# Patient Record
Sex: Female | Born: 1935 | Race: White | Hispanic: No | Smoking: Current every day smoker
Health system: Southern US, Community
[De-identification: ages and names within clinical notes are randomized; demographics above are authoritative.]

## PROBLEM LIST (undated history)

## (undated) DIAGNOSIS — J449 Chronic obstructive pulmonary disease, unspecified: Secondary | ICD-10-CM

## (undated) DIAGNOSIS — D649 Anemia, unspecified: Secondary | ICD-10-CM

## (undated) DIAGNOSIS — I82409 Acute embolism and thrombosis of unspecified deep veins of unspecified lower extremity: Secondary | ICD-10-CM

## (undated) DIAGNOSIS — E785 Hyperlipidemia, unspecified: Secondary | ICD-10-CM

## (undated) DIAGNOSIS — G610 Guillain-Barre syndrome: Secondary | ICD-10-CM

## (undated) DIAGNOSIS — I10 Essential (primary) hypertension: Secondary | ICD-10-CM

## (undated) DIAGNOSIS — I509 Heart failure, unspecified: Secondary | ICD-10-CM

## (undated) DIAGNOSIS — K5090 Crohn's disease, unspecified, without complications: Secondary | ICD-10-CM

## (undated) DIAGNOSIS — M87059 Idiopathic aseptic necrosis of unspecified femur: Secondary | ICD-10-CM

## (undated) HISTORY — PX: TOTAL HIP ARTHROPLASTY: SHX124

## (undated) HISTORY — PX: ABDOMINAL HYSTERECTOMY: SHX81

## (undated) HISTORY — PX: JOINT REPLACEMENT: SHX530

---

## 2004-12-10 ENCOUNTER — Inpatient Hospital Stay: Payer: Self-pay | Admitting: Internal Medicine

## 2005-05-19 ENCOUNTER — Other Ambulatory Visit: Payer: Self-pay

## 2005-05-19 ENCOUNTER — Inpatient Hospital Stay: Payer: Self-pay | Admitting: Internal Medicine

## 2006-01-16 ENCOUNTER — Ambulatory Visit: Payer: Self-pay | Admitting: Gastroenterology

## 2006-04-29 ENCOUNTER — Ambulatory Visit: Payer: Self-pay | Admitting: Family

## 2006-05-13 ENCOUNTER — Ambulatory Visit: Payer: Self-pay | Admitting: Family

## 2006-10-18 ENCOUNTER — Ambulatory Visit: Payer: Self-pay | Admitting: Family

## 2007-03-18 ENCOUNTER — Ambulatory Visit: Payer: Self-pay | Admitting: Ophthalmology

## 2007-10-21 ENCOUNTER — Ambulatory Visit: Payer: Self-pay | Admitting: Internal Medicine

## 2008-02-10 ENCOUNTER — Ambulatory Visit: Payer: Self-pay | Admitting: Ophthalmology

## 2008-02-10 ENCOUNTER — Other Ambulatory Visit: Payer: Self-pay

## 2008-02-24 ENCOUNTER — Ambulatory Visit: Payer: Self-pay | Admitting: Ophthalmology

## 2008-04-23 ENCOUNTER — Ambulatory Visit: Payer: Self-pay | Admitting: Internal Medicine

## 2008-10-21 ENCOUNTER — Ambulatory Visit: Payer: Self-pay | Admitting: Internal Medicine

## 2008-11-02 ENCOUNTER — Ambulatory Visit: Payer: Self-pay | Admitting: Internal Medicine

## 2009-03-16 ENCOUNTER — Ambulatory Visit: Payer: Self-pay | Admitting: Family

## 2009-05-03 ENCOUNTER — Ambulatory Visit: Payer: Self-pay | Admitting: Internal Medicine

## 2009-08-22 ENCOUNTER — Ambulatory Visit: Payer: Self-pay | Admitting: Gastroenterology

## 2009-08-30 ENCOUNTER — Ambulatory Visit: Payer: Self-pay | Admitting: Family

## 2009-11-24 ENCOUNTER — Ambulatory Visit: Payer: Self-pay | Admitting: Internal Medicine

## 2010-06-16 ENCOUNTER — Ambulatory Visit: Payer: Self-pay | Admitting: Internal Medicine

## 2010-12-20 ENCOUNTER — Ambulatory Visit: Payer: Self-pay | Admitting: Internal Medicine

## 2011-02-28 ENCOUNTER — Ambulatory Visit: Payer: Self-pay | Admitting: Internal Medicine

## 2011-03-01 ENCOUNTER — Ambulatory Visit: Payer: Self-pay | Admitting: Specialist

## 2011-03-07 ENCOUNTER — Inpatient Hospital Stay: Payer: Self-pay | Admitting: Specialist

## 2011-03-09 LAB — PATHOLOGY REPORT

## 2011-03-13 ENCOUNTER — Encounter: Payer: Self-pay | Admitting: Internal Medicine

## 2011-04-06 ENCOUNTER — Encounter: Payer: Self-pay | Admitting: Internal Medicine

## 2011-07-12 ENCOUNTER — Ambulatory Visit: Payer: Self-pay | Admitting: Internal Medicine

## 2012-02-07 DIAGNOSIS — J449 Chronic obstructive pulmonary disease, unspecified: Secondary | ICD-10-CM | POA: Diagnosis not present

## 2012-02-07 DIAGNOSIS — E559 Vitamin D deficiency, unspecified: Secondary | ICD-10-CM | POA: Diagnosis not present

## 2012-02-07 DIAGNOSIS — Z87891 Personal history of nicotine dependence: Secondary | ICD-10-CM | POA: Diagnosis not present

## 2012-02-07 DIAGNOSIS — C61 Malignant neoplasm of prostate: Secondary | ICD-10-CM | POA: Diagnosis not present

## 2012-02-07 DIAGNOSIS — K5190 Ulcerative colitis, unspecified, without complications: Secondary | ICD-10-CM | POA: Diagnosis not present

## 2012-02-07 DIAGNOSIS — M25569 Pain in unspecified knee: Secondary | ICD-10-CM | POA: Diagnosis not present

## 2012-02-07 DIAGNOSIS — I10 Essential (primary) hypertension: Secondary | ICD-10-CM | POA: Diagnosis not present

## 2012-02-07 DIAGNOSIS — K5180 Other ulcerative colitis without complications: Secondary | ICD-10-CM | POA: Diagnosis not present

## 2012-02-19 DIAGNOSIS — J398 Other specified diseases of upper respiratory tract: Secondary | ICD-10-CM | POA: Diagnosis not present

## 2012-05-20 DIAGNOSIS — J449 Chronic obstructive pulmonary disease, unspecified: Secondary | ICD-10-CM | POA: Diagnosis not present

## 2012-05-20 DIAGNOSIS — Z87891 Personal history of nicotine dependence: Secondary | ICD-10-CM | POA: Diagnosis not present

## 2012-05-20 DIAGNOSIS — M25569 Pain in unspecified knee: Secondary | ICD-10-CM | POA: Diagnosis not present

## 2012-05-20 DIAGNOSIS — I10 Essential (primary) hypertension: Secondary | ICD-10-CM | POA: Diagnosis not present

## 2012-05-26 DIAGNOSIS — R0602 Shortness of breath: Secondary | ICD-10-CM | POA: Diagnosis not present

## 2012-05-29 DIAGNOSIS — R0602 Shortness of breath: Secondary | ICD-10-CM | POA: Diagnosis not present

## 2012-05-29 DIAGNOSIS — D485 Neoplasm of uncertain behavior of skin: Secondary | ICD-10-CM | POA: Diagnosis not present

## 2012-05-29 DIAGNOSIS — L570 Actinic keratosis: Secondary | ICD-10-CM | POA: Diagnosis not present

## 2012-05-29 DIAGNOSIS — Z85828 Personal history of other malignant neoplasm of skin: Secondary | ICD-10-CM | POA: Diagnosis not present

## 2012-07-15 DIAGNOSIS — D0470 Carcinoma in situ of skin of unspecified lower limb, including hip: Secondary | ICD-10-CM | POA: Diagnosis not present

## 2012-08-19 DIAGNOSIS — J449 Chronic obstructive pulmonary disease, unspecified: Secondary | ICD-10-CM | POA: Diagnosis not present

## 2012-08-19 DIAGNOSIS — M159 Polyosteoarthritis, unspecified: Secondary | ICD-10-CM | POA: Diagnosis not present

## 2012-08-19 DIAGNOSIS — Z96649 Presence of unspecified artificial hip joint: Secondary | ICD-10-CM | POA: Diagnosis not present

## 2012-08-19 DIAGNOSIS — I10 Essential (primary) hypertension: Secondary | ICD-10-CM | POA: Diagnosis not present

## 2012-10-01 ENCOUNTER — Ambulatory Visit: Payer: Self-pay | Admitting: Internal Medicine

## 2012-10-01 DIAGNOSIS — R928 Other abnormal and inconclusive findings on diagnostic imaging of breast: Secondary | ICD-10-CM | POA: Diagnosis not present

## 2012-10-01 DIAGNOSIS — Z1231 Encounter for screening mammogram for malignant neoplasm of breast: Secondary | ICD-10-CM | POA: Diagnosis not present

## 2012-11-12 DIAGNOSIS — C44319 Basal cell carcinoma of skin of other parts of face: Secondary | ICD-10-CM | POA: Diagnosis not present

## 2012-11-12 DIAGNOSIS — D485 Neoplasm of uncertain behavior of skin: Secondary | ICD-10-CM | POA: Diagnosis not present

## 2012-11-12 DIAGNOSIS — L570 Actinic keratosis: Secondary | ICD-10-CM | POA: Diagnosis not present

## 2012-11-12 DIAGNOSIS — Z85828 Personal history of other malignant neoplasm of skin: Secondary | ICD-10-CM | POA: Diagnosis not present

## 2012-12-15 DIAGNOSIS — M549 Dorsalgia, unspecified: Secondary | ICD-10-CM | POA: Diagnosis not present

## 2012-12-15 DIAGNOSIS — I10 Essential (primary) hypertension: Secondary | ICD-10-CM | POA: Diagnosis not present

## 2012-12-15 DIAGNOSIS — Z87891 Personal history of nicotine dependence: Secondary | ICD-10-CM | POA: Diagnosis not present

## 2012-12-15 DIAGNOSIS — K5180 Other ulcerative colitis without complications: Secondary | ICD-10-CM | POA: Diagnosis not present

## 2013-01-21 DIAGNOSIS — C44319 Basal cell carcinoma of skin of other parts of face: Secondary | ICD-10-CM | POA: Diagnosis not present

## 2013-02-26 DIAGNOSIS — K921 Melena: Secondary | ICD-10-CM | POA: Diagnosis not present

## 2013-02-26 DIAGNOSIS — K5190 Ulcerative colitis, unspecified, without complications: Secondary | ICD-10-CM | POA: Diagnosis not present

## 2013-02-26 DIAGNOSIS — D649 Anemia, unspecified: Secondary | ICD-10-CM | POA: Diagnosis not present

## 2013-02-26 DIAGNOSIS — Z79899 Other long term (current) drug therapy: Secondary | ICD-10-CM | POA: Diagnosis not present

## 2013-02-27 ENCOUNTER — Inpatient Hospital Stay: Payer: Self-pay | Admitting: Internal Medicine

## 2013-02-27 DIAGNOSIS — K625 Hemorrhage of anus and rectum: Secondary | ICD-10-CM | POA: Diagnosis not present

## 2013-02-27 DIAGNOSIS — J449 Chronic obstructive pulmonary disease, unspecified: Secondary | ICD-10-CM | POA: Diagnosis present

## 2013-02-27 DIAGNOSIS — K5730 Diverticulosis of large intestine without perforation or abscess without bleeding: Secondary | ICD-10-CM | POA: Diagnosis not present

## 2013-02-27 DIAGNOSIS — M899 Disorder of bone, unspecified: Secondary | ICD-10-CM | POA: Diagnosis present

## 2013-02-27 DIAGNOSIS — K5289 Other specified noninfective gastroenteritis and colitis: Secondary | ICD-10-CM | POA: Diagnosis not present

## 2013-02-27 DIAGNOSIS — I10 Essential (primary) hypertension: Secondary | ICD-10-CM | POA: Diagnosis not present

## 2013-02-27 DIAGNOSIS — Z96649 Presence of unspecified artificial hip joint: Secondary | ICD-10-CM | POA: Diagnosis not present

## 2013-02-27 DIAGNOSIS — K5010 Crohn's disease of large intestine without complications: Secondary | ICD-10-CM | POA: Diagnosis present

## 2013-02-27 DIAGNOSIS — K624 Stenosis of anus and rectum: Secondary | ICD-10-CM | POA: Diagnosis not present

## 2013-02-27 DIAGNOSIS — K921 Melena: Secondary | ICD-10-CM | POA: Diagnosis not present

## 2013-02-27 DIAGNOSIS — K551 Chronic vascular disorders of intestine: Secondary | ICD-10-CM | POA: Diagnosis present

## 2013-02-27 DIAGNOSIS — F17200 Nicotine dependence, unspecified, uncomplicated: Secondary | ICD-10-CM | POA: Diagnosis present

## 2013-02-27 DIAGNOSIS — D638 Anemia in other chronic diseases classified elsewhere: Secondary | ICD-10-CM | POA: Diagnosis not present

## 2013-02-27 DIAGNOSIS — D649 Anemia, unspecified: Secondary | ICD-10-CM | POA: Diagnosis not present

## 2013-02-27 DIAGNOSIS — E870 Hyperosmolality and hypernatremia: Secondary | ICD-10-CM | POA: Diagnosis present

## 2013-02-27 DIAGNOSIS — R6889 Other general symptoms and signs: Secondary | ICD-10-CM | POA: Diagnosis not present

## 2013-02-27 DIAGNOSIS — D500 Iron deficiency anemia secondary to blood loss (chronic): Secondary | ICD-10-CM | POA: Diagnosis present

## 2013-02-27 DIAGNOSIS — E785 Hyperlipidemia, unspecified: Secondary | ICD-10-CM | POA: Diagnosis present

## 2013-02-27 DIAGNOSIS — K5100 Ulcerative (chronic) pancolitis without complications: Secondary | ICD-10-CM | POA: Diagnosis not present

## 2013-02-27 DIAGNOSIS — K922 Gastrointestinal hemorrhage, unspecified: Secondary | ICD-10-CM | POA: Diagnosis not present

## 2013-02-27 DIAGNOSIS — E860 Dehydration: Secondary | ICD-10-CM | POA: Diagnosis not present

## 2013-02-27 LAB — CBC WITH DIFFERENTIAL/PLATELET
Basophil #: 0.1 10*3/uL (ref 0.0–0.1)
Basophil %: 0.7 %
Eosinophil #: 0.2 10*3/uL (ref 0.0–0.7)
Eosinophil %: 2.8 %
HGB: 10.6 g/dL — ABNORMAL LOW (ref 12.0–16.0)
Lymphocyte #: 1.4 10*3/uL (ref 1.0–3.6)
Lymphocyte %: 16.7 %
MCHC: 32.5 g/dL (ref 32.0–36.0)
MCV: 94 fL (ref 80–100)
Monocyte %: 10.2 %
Neutrophil #: 5.8 10*3/uL (ref 1.4–6.5)
Platelet: 388 10*3/uL (ref 150–440)
RBC: 3.47 10*6/uL — ABNORMAL LOW (ref 3.80–5.20)
RDW: 16.4 % — ABNORMAL HIGH (ref 11.5–14.5)
WBC: 8.3 10*3/uL (ref 3.6–11.0)

## 2013-02-27 LAB — COMPREHENSIVE METABOLIC PANEL
Anion Gap: 3 — ABNORMAL LOW (ref 7–16)
BUN: 37 mg/dL — ABNORMAL HIGH (ref 7–18)
Bilirubin,Total: 0.1 mg/dL — ABNORMAL LOW (ref 0.2–1.0)
Calcium, Total: 8.9 mg/dL (ref 8.5–10.1)
Chloride: 104 mmol/L (ref 98–107)
Co2: 35 mmol/L — ABNORMAL HIGH (ref 21–32)
EGFR (African American): 46 — ABNORMAL LOW
EGFR (Non-African Amer.): 40 — ABNORMAL LOW
Potassium: 3.7 mmol/L (ref 3.5–5.1)
SGPT (ALT): 8 U/L — ABNORMAL LOW (ref 12–78)
Sodium: 142 mmol/L (ref 136–145)
Total Protein: 6.3 g/dL — ABNORMAL LOW (ref 6.4–8.2)

## 2013-02-27 LAB — PROTIME-INR
INR: 1
Prothrombin Time: 13.1 secs (ref 11.5–14.7)

## 2013-02-27 LAB — URINALYSIS, COMPLETE
Blood: NEGATIVE
Leukocyte Esterase: NEGATIVE
Nitrite: NEGATIVE
RBC,UR: 1 /HPF (ref 0–5)
Specific Gravity: 1.012 (ref 1.003–1.030)
Squamous Epithelial: NONE SEEN
WBC UR: <1 /HPF (ref 0–5)

## 2013-02-28 LAB — HEMOGLOBIN
HGB: 9.7 g/dL — ABNORMAL LOW (ref 12.0–16.0)
HGB: 10.2 g/dL — ABNORMAL LOW (ref 12.0–16.0)

## 2013-03-01 LAB — BASIC METABOLIC PANEL
Anion Gap: 4 — ABNORMAL LOW (ref 7–16)
BUN: 21 mg/dL — ABNORMAL HIGH (ref 7–18)
Co2: 30 mmol/L (ref 21–32)
Creatinine: 1.02 mg/dL (ref 0.60–1.30)
EGFR (African American): 59 — ABNORMAL LOW
EGFR (Non-African Amer.): 51 — ABNORMAL LOW
Glucose: 148 mg/dL — ABNORMAL HIGH (ref 65–99)
Osmolality: 296 (ref 275–301)
Potassium: 4.4 mmol/L (ref 3.5–5.1)
Sodium: 146 mmol/L — ABNORMAL HIGH (ref 136–145)

## 2013-03-02 LAB — BASIC METABOLIC PANEL
Anion Gap: 9 (ref 7–16)
BUN: 22 mg/dL — ABNORMAL HIGH (ref 7–18)
Calcium, Total: 8.6 mg/dL (ref 8.5–10.1)
Co2: 25 mmol/L (ref 21–32)
Creatinine: 1.05 mg/dL (ref 0.60–1.30)
EGFR (Non-African Amer.): 49 — ABNORMAL LOW
Osmolality: 290 (ref 275–301)
Potassium: 3.7 mmol/L (ref 3.5–5.1)
Sodium: 143 mmol/L (ref 136–145)

## 2013-03-02 LAB — MAGNESIUM: Magnesium: 1.8 mg/dL

## 2013-03-10 DIAGNOSIS — J983 Compensatory emphysema: Secondary | ICD-10-CM | POA: Diagnosis not present

## 2013-03-10 DIAGNOSIS — G609 Hereditary and idiopathic neuropathy, unspecified: Secondary | ICD-10-CM | POA: Diagnosis not present

## 2013-03-20 DIAGNOSIS — J983 Compensatory emphysema: Secondary | ICD-10-CM | POA: Diagnosis not present

## 2013-03-20 DIAGNOSIS — K5180 Other ulcerative colitis without complications: Secondary | ICD-10-CM | POA: Diagnosis not present

## 2013-03-20 DIAGNOSIS — M159 Polyosteoarthritis, unspecified: Secondary | ICD-10-CM | POA: Diagnosis not present

## 2013-03-20 DIAGNOSIS — I10 Essential (primary) hypertension: Secondary | ICD-10-CM | POA: Diagnosis not present

## 2013-03-23 DIAGNOSIS — K5150 Left sided colitis without complications: Secondary | ICD-10-CM | POA: Diagnosis not present

## 2013-04-14 DIAGNOSIS — Z85828 Personal history of other malignant neoplasm of skin: Secondary | ICD-10-CM | POA: Diagnosis not present

## 2013-04-14 DIAGNOSIS — L570 Actinic keratosis: Secondary | ICD-10-CM | POA: Diagnosis not present

## 2013-04-20 DIAGNOSIS — M159 Polyosteoarthritis, unspecified: Secondary | ICD-10-CM | POA: Diagnosis not present

## 2013-04-20 DIAGNOSIS — J449 Chronic obstructive pulmonary disease, unspecified: Secondary | ICD-10-CM | POA: Diagnosis not present

## 2013-04-20 DIAGNOSIS — J983 Compensatory emphysema: Secondary | ICD-10-CM | POA: Diagnosis not present

## 2013-04-20 DIAGNOSIS — E785 Hyperlipidemia, unspecified: Secondary | ICD-10-CM | POA: Diagnosis not present

## 2013-05-21 DIAGNOSIS — J983 Compensatory emphysema: Secondary | ICD-10-CM | POA: Diagnosis not present

## 2013-05-21 DIAGNOSIS — J449 Chronic obstructive pulmonary disease, unspecified: Secondary | ICD-10-CM | POA: Diagnosis not present

## 2013-06-16 DIAGNOSIS — K5180 Other ulcerative colitis without complications: Secondary | ICD-10-CM | POA: Diagnosis not present

## 2013-06-16 DIAGNOSIS — M159 Polyosteoarthritis, unspecified: Secondary | ICD-10-CM | POA: Diagnosis not present

## 2013-06-16 DIAGNOSIS — Z87891 Personal history of nicotine dependence: Secondary | ICD-10-CM | POA: Diagnosis not present

## 2013-06-16 DIAGNOSIS — I10 Essential (primary) hypertension: Secondary | ICD-10-CM | POA: Diagnosis not present

## 2013-09-15 DIAGNOSIS — J449 Chronic obstructive pulmonary disease, unspecified: Secondary | ICD-10-CM | POA: Diagnosis not present

## 2013-09-15 DIAGNOSIS — Z87891 Personal history of nicotine dependence: Secondary | ICD-10-CM | POA: Diagnosis not present

## 2013-09-15 DIAGNOSIS — J983 Compensatory emphysema: Secondary | ICD-10-CM | POA: Diagnosis not present

## 2013-10-05 ENCOUNTER — Ambulatory Visit: Payer: Self-pay | Admitting: Internal Medicine

## 2013-10-05 DIAGNOSIS — Z1231 Encounter for screening mammogram for malignant neoplasm of breast: Secondary | ICD-10-CM | POA: Diagnosis not present

## 2013-10-05 DIAGNOSIS — R922 Inconclusive mammogram: Secondary | ICD-10-CM | POA: Diagnosis not present

## 2013-11-26 DIAGNOSIS — K5190 Ulcerative colitis, unspecified, without complications: Secondary | ICD-10-CM | POA: Diagnosis not present

## 2014-01-19 DIAGNOSIS — Z87891 Personal history of nicotine dependence: Secondary | ICD-10-CM | POA: Diagnosis not present

## 2014-01-19 DIAGNOSIS — M25569 Pain in unspecified knee: Secondary | ICD-10-CM | POA: Diagnosis not present

## 2014-01-19 DIAGNOSIS — M549 Dorsalgia, unspecified: Secondary | ICD-10-CM | POA: Diagnosis not present

## 2014-01-19 DIAGNOSIS — J983 Compensatory emphysema: Secondary | ICD-10-CM | POA: Diagnosis not present

## 2014-03-08 DIAGNOSIS — L570 Actinic keratosis: Secondary | ICD-10-CM | POA: Diagnosis not present

## 2014-03-08 DIAGNOSIS — I10 Essential (primary) hypertension: Secondary | ICD-10-CM | POA: Diagnosis not present

## 2014-03-08 DIAGNOSIS — C44319 Basal cell carcinoma of skin of other parts of face: Secondary | ICD-10-CM | POA: Diagnosis not present

## 2014-03-08 DIAGNOSIS — J983 Compensatory emphysema: Secondary | ICD-10-CM | POA: Diagnosis not present

## 2014-03-08 DIAGNOSIS — L821 Other seborrheic keratosis: Secondary | ICD-10-CM | POA: Diagnosis not present

## 2014-03-08 DIAGNOSIS — M159 Polyosteoarthritis, unspecified: Secondary | ICD-10-CM | POA: Diagnosis not present

## 2014-03-08 DIAGNOSIS — Z85828 Personal history of other malignant neoplasm of skin: Secondary | ICD-10-CM | POA: Diagnosis not present

## 2014-03-08 DIAGNOSIS — D649 Anemia, unspecified: Secondary | ICD-10-CM | POA: Diagnosis not present

## 2014-03-08 DIAGNOSIS — J449 Chronic obstructive pulmonary disease, unspecified: Secondary | ICD-10-CM | POA: Diagnosis not present

## 2014-03-08 DIAGNOSIS — D485 Neoplasm of uncertain behavior of skin: Secondary | ICD-10-CM | POA: Diagnosis not present

## 2014-03-08 DIAGNOSIS — E785 Hyperlipidemia, unspecified: Secondary | ICD-10-CM | POA: Diagnosis not present

## 2014-04-08 DIAGNOSIS — M159 Polyosteoarthritis, unspecified: Secondary | ICD-10-CM | POA: Diagnosis not present

## 2014-04-08 DIAGNOSIS — M549 Dorsalgia, unspecified: Secondary | ICD-10-CM | POA: Diagnosis not present

## 2014-04-08 DIAGNOSIS — Z96649 Presence of unspecified artificial hip joint: Secondary | ICD-10-CM | POA: Diagnosis not present

## 2014-04-08 DIAGNOSIS — I119 Hypertensive heart disease without heart failure: Secondary | ICD-10-CM | POA: Diagnosis not present

## 2014-09-13 DIAGNOSIS — N182 Chronic kidney disease, stage 2 (mild): Secondary | ICD-10-CM | POA: Diagnosis not present

## 2014-09-13 DIAGNOSIS — E784 Other hyperlipidemia: Secondary | ICD-10-CM | POA: Diagnosis not present

## 2014-09-13 DIAGNOSIS — J983 Compensatory emphysema: Secondary | ICD-10-CM | POA: Diagnosis not present

## 2014-09-13 DIAGNOSIS — M159 Polyosteoarthritis, unspecified: Secondary | ICD-10-CM | POA: Diagnosis not present

## 2014-12-06 ENCOUNTER — Ambulatory Visit: Payer: Self-pay | Admitting: Internal Medicine

## 2014-12-06 DIAGNOSIS — Z1231 Encounter for screening mammogram for malignant neoplasm of breast: Secondary | ICD-10-CM | POA: Diagnosis not present

## 2015-01-10 DIAGNOSIS — I10 Essential (primary) hypertension: Secondary | ICD-10-CM | POA: Diagnosis not present

## 2015-01-10 DIAGNOSIS — J983 Compensatory emphysema: Secondary | ICD-10-CM | POA: Diagnosis not present

## 2015-01-10 DIAGNOSIS — G4700 Insomnia, unspecified: Secondary | ICD-10-CM | POA: Diagnosis not present

## 2015-01-10 DIAGNOSIS — J449 Chronic obstructive pulmonary disease, unspecified: Secondary | ICD-10-CM | POA: Diagnosis not present

## 2015-02-24 DIAGNOSIS — G450 Vertebro-basilar artery syndrome: Secondary | ICD-10-CM | POA: Diagnosis not present

## 2015-02-24 DIAGNOSIS — R609 Edema, unspecified: Secondary | ICD-10-CM | POA: Diagnosis not present

## 2015-02-24 DIAGNOSIS — G459 Transient cerebral ischemic attack, unspecified: Secondary | ICD-10-CM | POA: Diagnosis not present

## 2015-02-25 NOTE — Consult Note (Signed)
PATIENT NAME:  Lindsey Hobbs, Lindsey Hobbs MR#:  [id number] DATE OF BIRTH:  1927-01-08  DATE OF CONSULTATION:  02/28/2013  CONSULTING PHYSICIAN:  Lollie Sails, MD  REASON FOR CONSULTATION: Rectal bleeding.   HISTORY OF PRESENT ILLNESS: This is a patient of Dr. Lenore Manner. The patient is an 81 year old Caucasian female who presented to the Emergency Room yesterday with rectal bleeding. She states that she went to the bathroom earlier in the afternoon and noted a bloody bowel movement. This was repeated 3 times over the course of the evening and she came to the Emergency Room. The last time she saw any blood was indeed yesterday afternoon. She has a history of ulcerative colitis, her last colonoscopy being 08/22/2009 with finding of a pancolitis. She has been taking mesalamine 0.375 gram tablets, two 3 times a day. However, she has also been taking some meloxicam. She stated that this occurred without any antecedent problems with abdominal pain or nausea. She states that she felt her ulcerative colitis was doing quite well. There is no nausea, vomiting or abdominal pain. There is no heartburn. There is some occasional dysphagia to large pills. She has a bowel movement usually 3 to 4 times a day that she describes as mushy, however, denies any blood in the stools until yesterday afternoon. When she was diagnosed a good 12 years ago or so, she states that she presented at that time with abdominal pain as well as bloody stool.   GASTROINTESTINAL FAMILY HISTORY: Negative for colorectal cancer, liver disease or ulcers.   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 She has had a hysterectomy.   SOCIAL HISTORY: She is a smoker, 1/2 pack of cigarettes daily long term. She does not drink alcohol. She lives alone.   OUTPATIENT MEDICATIONS: Include  Actonel 35 mg once a week, Advil 200 mg 2 tablets every 6 hours p.r.n., Altace 10 mg once a day, aspirin 81 mg, Caltrate 600 plus D, meloxicam 1 twice a day, mesalamine as noted, Systane ophthalmic drops.   ALLERGIES: She has no known drug allergies.   REVIEW OF SYSTEMS: Ten systems reviewed per admission history and physical negative.   PHYSICAL EXAMINATION:  VITAL SIGNS: Temperature is 97.8, pulse 72, respirations 18, blood pressure 136/81, pulse oximetry 91.  GENERAL: She is an 81 year old Caucasian female in no acute distress. She denies any nausea or abdominal pain.  HEENT: Normocephalic, atraumatic. Eyes: Anicteric. Nose: Septum midline. No lesions. Oropharynx: Poor to fair dentition.  NECK: No JVD.  HEART: Regular rate and rhythm.  LUNGS: Clear.  ABDOMEN: Soft, nontender, nondistended. Bowel sounds positive, normoactive.  RECTAL: Anorectal examination shows some old, dryish maroon type of effluent. There is no fresh active bleeding and no mucoid type stool. There are multiple external skin tags.  EXTREMITIES: No clubbing, cyanosis or edema.  NEUROLOGICAL: Cranial nerves II through XII grossly intact. Muscle strength bilaterally equal and symmetric. DTRs bilaterally equal and symmetric.   LABORATORY AND RADIOLOGICAL DATA: On admission to the hospital, she had a glucose of 125, BUN 37, creatinine 1.25, sodium 142, potassium 3.7, chloride 104, bicarbonate 35, calcium 8.9. Hepatic profile showing a total protein of 6.3, albumin 2.6, total bilirubin 0.1, alkaline phosphatase 40, AST 19, ALT 8. Hemogram showed a white count of 8.3, hemoglobin and hematocrit 10.6 and 32.7, platelet count 388, MCV 94. Repeat hemoglobin this morning at 9.7. Her INR is  1.0 with a pro time of 13.1. Urinalysis was negative. She had a GI bleeding scan which was negative for lower GI bleed.   ASSESSMENT: Rectal bleeding in the setting of a history of inflammatory bowel disease. It is of note that she was initially  diagnosed with Crohn's disease, this later being changed to ulcerative colitis. In review of her past several colonoscopies, these are consistent more so with ulcerative colitis in a pancolitis type fashion. She has been stable apparently for quite some time. The differential diagnoses on her rectal bleeding would include some exacerbation of her colitis as well as anal outlet bleeding from internal hemorrhoids, other bleeding sources such as stool for Dieulafoy lesions/vascular anomalies, et Ronney Asters. Doubt this is diverticular in nature, although also a possibility. Of note, negative GI bleeding scan. I am more concerned that this is likely an anal outlet type bleed.   RECOMMENDATION: Will see the patient again tomorrow and consideration for luminal evaluation. Quite likely a flexible sigmoidoscopy will be enough to determine whether she is having exacerbation of colitis versus anal outlet bleeding.   ____________________________ Lollie Sails, MD mus:jm D: 02/28/2013 14:12:28 ET T: 02/28/2013 14:44:06 ET JOB#: [id number]  cc: Lollie Sails, MD, <Dictator> Lollie Sails MD ELECTRONICALLY SIGNED 03/17/2013 13:02

## 2015-02-25 NOTE — Discharge Summary (Signed)
PATIENT NAME:  Lindsey Hobbs, Lindsey Hobbs MR#:  [id number] DATE OF BIRTH:  1927-05-07  DATE OF ADMISSION:  02/27/2013 DATE OF DISCHARGE:  03/03/2013  PRIMARY CARE PHYSICIAN: Dr. Lavera Guise.    CONSULTATIONS: Dr. Gustavo Lah.   PROCEDURE:  Sigmoidoscopy.   DISCHARGE DIAGNOSES: 1.  Gastrointestinal bleeding, possibly due to Crohn's disease versus chronic ischemic colitis.  2.  Anemia.  3.  Dehydration.  4.  Hypernatremia.   CONDITION: Stable.   CODE STATUS: Full code   HOME MEDICATIONS: Please refer to the [facility] discharge instruction medication reconciliation list.  The patient needs to continue mesalamine and also prednisone.  Can follow up with GI as an outpatient.    DIET:  Low sodium diet.  ACTIVITY: As tolerated.   FOLLOWUP CARE: Follow up with PCP within 1 to 2 weeks. Follow up with Dr. Gustavo Lah within 1 week.   REASON FOR ADMISSION: Bright red blood per rectum.    HOSPITAL COURSE:  1.  The patient is an 81 year old Caucasian female with a history of ulcerative colitis, hypertension, COPD, presented to the ED with bright red blood per rectum.  The patient was admitted for GI bleeding and for further workup.  For detailed history and physical examination, please refer to the admission note dictated by Dr. Lenore Manner. On admission date, the patient's BUN 37, creatinine 1.25, sodium 142, potassium 3.7. CBC showed WBC 8000, hemoglobin 10, platelets 388. INR 1. After admission, the patient was kept n.p.o. and hemoglobin was checked at q.8 hours.  The patient was also treated with IV fluid support, Solu-Medrol. In addition, mesalamine was continued. The patient's aspirin, meloxicam and Advil was discontinued due to GI bleeding. Dr. Gustavo Lah suggested sigmoidoscopy which showed rectal mucosa friable but otherwise normal in appearance. In addition, the patient has diverticulosis, healing ulcerated area noted in area of stenosis. Dr. Gustavo Lah did biopsy.  He suggested the patient may have Crohn's disease versus  chronic ischemic colitis. In addition, he ordered IBD panel. He suggested to follow up him as an outpatient.  2.  Dehydration after IV fluid support has improved.  3.  Hypernatremia has improved after IV fluid support.  4.  Hypertension, COPD has been stable.   The patient has no active bleeding. She only had 1 episode of bloody stool yesterday, but hemoglobin has been stable, hemoglobin today is 9.9. The patient will be discharged to home today and follow up with Dr. Gustavo Lah as outpatient. I discussed the patient's discharge plan with patient, Dr. Gustavo Lah and case manager.   TIME SPENT: About 38 minutes.   ____________________________ Demetrios Loll, MD qc:cs D: 03/03/2013 16:17:00 ET T: 03/03/2013 20:08:52 ET JOB#: [id number]  cc: Demetrios Loll, MD, <Dictator> Demetrios Loll MD ELECTRONICALLY SIGNED 03/04/2013 14:35

## 2015-02-25 NOTE — H&P (Signed)
PATIENT NAME:  Lindsey Hobbs, Lindsey Hobbs MR#:  [id number] DATE OF BIRTH:  September 14, 1927  DATE OF ADMISSION:  02/27/2013  PRIMARY CARE PHYSICIAN: Rinaldo Cloud, MD   REFERRING PHYSICIAN: Marta Antu, MD  CHIEF COMPLAINT: Bright red blood per rectum.   HISTORY OF PRESENT ILLNESS: The patient is an 81 year old Caucasian female with a history of ulcerative colitis, systemic hypertension, chronic obstructive pulmonary disease. The patient was in her usual state of health until yesterday when she started to episodes of bright red blood per rectum and then another three episodes today. Finally, she decided to come to the hospital for further evaluation. Work-up here at the Emergency Department reveals stable vital signs and her hemoglobin was 10.6. The patient is scheduled to have a bleeding scan and now she is in the process to be admitted to the hospital for further evaluation and treatment.   REVIEW OF SYSTEMS.  CONSTITUTIONAL: Denies any fever. No chills. No fatigue.  EYES: No blurring of vision. No double vision.  ENT: No hearing impairment. No sore throat. No dysphagia.  CARDIOVASCULAR: No chest pain. No shortness of breath. No syncope.  RESPIRATORY: No cough. No shortness of breath. No chest pain.  GASTROINTESTINAL: No abdominal pain. No vomiting, but no diarrhea, but she has bright red blood per rectum x 4 episodes over the last 48 hours.  GENITOURINARY: No dysuria. No frequency of urination.  MUSCULOSKELETAL: No joint pain or swelling. No muscular pain or swelling today.  INTEGUMENTARY: No skin rash or ulcers.  NEUROLOGY: No focal weakness. No seizure activity. No headache.  PSYCHIATRY: No anxiety. No depression.  ENDOCRINE: No polyuria or polydipsia. No heat or cold intolerance.   PAST MEDICAL HISTORY: Ulcerative colitis. The last colonoscopy in the records is October 2010 showing internal hemorrhoids congested, erythematous and friable with spontaneous  bleeding, hemorrhagic and ulcerated mucosa over  the entire examined colon. Of note in her old records that she had Crohn's disease, but the patient tells me that this diagnosis was changed to ulcerative colitis. Her history also includes hypertension, chronic obstructive pulmonary disease, ongoing tobacco abuse and history of left hip avascular necrosis; underwent total hip replacement, hyperlipidemia, osteopenia, and also history of Guillain-Barre syndrome in 1993.   PAST SURGICAL HISTORY: Hysterectomy and left total hip replacement after developing severe avascular necrosis.   FAMILY HISTORY: Her mother suffered from breast cancer and congestive heart failure and she is deceased now. She does not have much of information about her father. She is just guessing that he had a heart attack.   SOCIAL HABITS: Chronic smoker of 1/2 pack a day since age of 48. No history of alcohol abuse.   SOCIAL HISTORY: She is divorced, lives at home alone and her son checks on her every now and then.   ADMISSION MEDICATIONS: Mesalamine 0.0375 grams 2 capsules 3 x a day, Altace 10 mg once a day, aspirin 81 mg a day, Advil 200 mg 2 tablets q.6 hours p.r.n. and meloxicam, dose was not specified but she takes it twice a day, Actonel 35 mg once a week on Wednesdays, Caltrate 600 with vitamin D twice a day and Systane ophthalmic solution.   ALLERGIES: No known drug allergies.   PHYSICAL EXAMINATION:  VITAL SIGNS: Blood pressure 145/66, pulse 84, respiratory rate 18, temperature 98.6. Oxygen saturation 99%.  GENERAL APPEARANCE: Elderly female lying in bed in no acute distress.  HEAD AND NECK: No pallor. No icterus. No cyanosis.  EARS, NOSE, THROAT: Ear examination revealed normal hearing, no discharge, no  lesions. Examination of the nose showed no bleeding, no ulcers, no discharge. Examination of the mouth showed normal lips and tongue, no oral thrush, no exudates. Eye examination revealed normal eyelids and conjunctivae. Pupils about 4 to 5 mm, round, equal, sluggishly  reactive to light.  NECK: Supple. Trachea at midline. No thyromegaly. No cervical lymphadenopathy. No masses.  HEART: Normal S1, S2. No S3, S4. No murmur. No gallop. No carotid bruits.  RESPIRATORY: Normal breathing pattern without use of accessory muscles. No rales. No wheezing.  ABDOMEN: Soft without tenderness. No hepatosplenomegaly. No masses. No hernias.  SKIN: No ulcers. No subcutaneous nodules. There are pink to slightly hyperpigmented macules over the entire body and appears to be benign.  MUSCULOSKELETAL: No joint swelling. No clubbing.  NEUROLOGIC: Cranial nerves II through XII are intact. No focal motor deficit.  PSYCHIATRIC: The patient is alert and oriented x 3. Mood and affect were normal.   LABORATORY FINDINGS: Her EKG showed normal sinus rhythm at rate of 87 per minute. Unremarkable EKG. Serum glucose 125, BUN 37, creatinine 1.25, sodium 142, potassium 3.7, calcium 8.9. Total protein 6.3, albumin 2.6. Total bilirubin 0.1, AST 19, ALT 8. CBC showed white count of 8000, hemoglobin 10, hematocrit 32, platelet count 388. Prothrombin time 13. INR 1, APTT 34.   ASSESSMENT:  1.  Bright red blood per rectum. 2.  Ulcerative colitis. 3.  Systemic hypertension.  4.  Anemia, which appears to be anemia of chronic disease combined with anemia due to blood loss.   Her other medical problems include chronic obstructive pulmonary disease, osteopenia, history of left hip avascular necrosis, underwent left hip replacement. History of systemic hypertension. The patient has hypertension, hyperlipidemia and past history of Guillain-Barre syndrome.   PLAN: We will admit the patient to telemetry monitoring. Follow up on her hemoglobin; bleeding scan was ordered. Check hemoglobin q.8 hours. IV fluids.  IV Solu-Medrol. Continue mesalamine. We will stop aspirin, meloxicam and Advil. I will also hold Altace in order not to compromise blood pressure in the face of continuous GI bleed until stabilization. GI  consultation. TED stockings for deep vein thrombosis prophylaxis. I advised the patient to stop smoking and I offered the nicotine patch but she declined.   Time spent in evaluating this patient took more than 55 minutes.   ____________________________ Clovis Pu. Lenore Manner, MD amd:am D: 02/27/2013 23:37:17 ET T: 02/28/2013 01:40:16 ET JOB#: [id number]  cc: Clovis Pu. Lenore Manner, MD, <Dictator> Mike Craze Irven Coe MD ELECTRONICALLY SIGNED 03/29/2013 23:30

## 2015-02-25 NOTE — Consult Note (Signed)
Brief Consult Note: Diagnosis: rectal bleding.   Patient was seen by consultant.   Consult note dictated.   Recommend further assessment or treatment.   Comments: Please see full  GI consult.  Patietn presenting with rectal bleeding in the setting of previous h/o UC.  Patient has been stable in this regard taking daily mesalamine. DDx includes exacerbation of UC, anal outlet bleeding such as hemorrhoids (internal) , less likely diverticular or vascular anomally.  Will see paitn again tomorrow. Patietn stable without recurrent bleeding.  Luminal evaluation with Flex sig versus colonoscopy monday.  Electronic Signatures: Loistine Simas (MD)  (Signed 26-Apr-14 14:15)  Authored: Brief Consult Note   Last Updated: 26-Apr-14 14:15 by Loistine Simas (MD)

## 2015-02-25 NOTE — Consult Note (Signed)
Chief Complaint:  Subjective/Chief Complaint patient seen for rectal bleeding.  tolerated enema prep for flexible sigmoidoscopy.  hasnt eaten or any drink today.   VITAL SIGNS/ANCILLARY NOTES: **Vital Signs.:   28-Apr-14 04:50  Vital Signs Type Routine  Temperature Temperature (F) 97.9  Celsius 36.6  Temperature Source oral  Pulse Pulse 91  Respirations Respirations 20  Systolic BP Systolic BP 130  Diastolic BP (mmHg) Diastolic BP (mmHg) 71  Mean BP 90  Pulse Ox % Pulse Ox % 91  Pulse Ox Activity Level  At rest  Oxygen Delivery Room Air/ 21 %  Telemetry pattern Cardiac Rhythm Normal sinus rhythm; PAC's; pattern reported by Telemetry Clerk; HR70s    08:47  Vital Signs Type Routine  Pulse Pulse 78  Systolic BP Systolic BP 119  Diastolic BP (mmHg) Diastolic BP (mmHg) 56  Mean BP 77  Pulse Ox % Pulse Ox % 91  Oxygen Delivery Room Air/ 21 %  *Intake and Output.:   28-Apr-14 15:17  Stool  large liquid dark colored   Brief Assessment:  Cardiac Regular   Respiratory clear BS   Gastrointestinal details normal Soft  Nontender  Nondistended  No masses palpable  Bowel sounds normal   Lab Results: Routine Chem:  28-Apr-14 04:55   Glucose, Serum  124  BUN  22  Creatinine (comp) 1.05  Sodium, Serum 143  Potassium, Serum 3.7  Chloride, Serum  109  CO2, Serum 25  Calcium (Total), Serum 8.6  Anion Gap 9  Osmolality (calc) 290  eGFR (African American)  57  eGFR (Non-African American)  49 (eGFR values <60mL/min/1.73 m2 may be an indication of chronic kidney disease (CKD). Calculated eGFR is useful in patients with stable renal function. The eGFR calculation will not be reliable in acutely ill patients when serum creatinine is changing rapidly. It is not useful in  patients on dialysis. The eGFR calculation may not be applicable to patients at the low and high extremes of body sizes, pregnant women, and vegetarians.)  Magnesium, Serum 1.8 (1.8-2.4 THERAPEUTIC RANGE: 4-7  mg/dL TOXIC: > 10 mg/dL  -----------------------)  Routine Hem:  25-Apr-14 20:45   Hemoglobin (CBC)  10.6  26-Apr-14 04:26   Hemoglobin (CBC)  9.7 (Result(s) reported on 28 Feb 2013 at 04:56AM.)    13:53   Hemoglobin (CBC)  10.2 (Result(s) reported on 28 Feb 2013 at 02:28PM.)  27-Apr-14 05:26   Hemoglobin (CBC)  9.4 (Result(s) reported on 01 Mar 2013 at 06:12AM.)   Radiology Results: Nuclear Med:    26-Apr-14 12:43, GI Blood Loss Study - Nuc Med  GI Blood Loss Study - Nuc Med   REASON FOR EXAM:    rectal bleeding  COMMENTS:       PROCEDURE: NM  - NM GI BLOOD LOSS STUDY  - Feb 28 2013 12:43PM     RESULT: Bleeding Scan    Procedure: GI bleeding scan dated 02/28/2013    Indication: Rectal bleeding    Comparison: None    Radiopharmaceutical: Tc-99m tagged RBC administered intravenously. 3 ml   of pyrophosphate was utilized for tagging.  Technique: Dynamic, anterior planar images of the abdomen were obtained   over 60 minutes.    Findings: No abnormal focus of activity is demonstrated in the bowel to   suggest a lower GI bleed. Normal physiologic biodistribution of the   radiotracer is demonstrated throughout the abdomen.    IMPRESSION:      No scintigraphic evidence for a lower GI bleed.    Dictation   Site: 1        Verified By: HETAL P. PATEL, M.D., MD   Assessment/Plan:  Assessment/Plan:  Assessment 1) hematochezia-not recurrent since admission.  Suspect anal outlet source (hemorrhoid/fissure, etc) but patient with h/o ulcerative colitis.  Per patient has been well controlled, takes her meds as prescribed.  Bleeding was bright red, no mucus.   Plan 1) as above.  Will do flex-sig today.  I have discussed the risks benefits and complications of flex sig to include not limited to bleeding infection perforation and sedatin and she wishes to proceed.  Further recs to follow.   Electronic Signatures: ,  (MD)  (Signed 28-Apr-14 16:15)  Authored: Chief  Complaint, VITAL SIGNS/ANCILLARY NOTES, Brief Assessment, Lab Results, Radiology Results, Assessment/Plan   Last Updated: 28-Apr-14 16:15 by ,  (MD) 

## 2015-02-25 NOTE — Consult Note (Signed)
Chief Complaint:  Subjective/Chief Complaint Please see Flexible sigmoidoscopy report.  Rectal mucosa friable but otherwise normal in appearance.  Stenosis from about 15-28 cm.  scope not taken beyond 28 cm.  Diverticulosis noted  proximal to this.   Healing ulcerated areas noted in the area of stenosis.  Biopsies taken from the area of stenosis and rectal vault.  DDX-Crohns disease versus chronic ischemic colitis.  Biopsies should be helpful for differentiation.   Continue mesalamine at current dose.   Will obtain [facility] IBD panel.  Will need GI fu outpatient.  If biopsies are more consistant with ischemia than IBD, will need to do CTA.  Discussed with Dr Bridgett Larsson.   Electronic Signatures: Loistine Simas (MD)  (Signed 28-Apr-14 17:07)  Authored: Chief Complaint   Last Updated: 28-Apr-14 17:07 by Loistine Simas (MD)

## 2015-02-25 NOTE — Consult Note (Signed)
Chief Complaint:  Subjective/Chief Complaint seen for rectal bleeding.  no repeat bleeding though patient did have a bm,.  no abdominal pain or nausea.   VITAL SIGNS/ANCILLARY NOTES: **Vital Signs.:   27-Apr-14 04:35  Vital Signs Type Routine  Temperature Temperature (F) 98  Celsius 36.6  Temperature Source oral  Pulse Pulse 86  Respirations Respirations 18  Systolic BP Systolic BP 989  Diastolic BP (mmHg) Diastolic BP (mmHg) 70  Mean BP 90  Pulse Ox % Pulse Ox % 90  Pulse Ox Activity Level  At rest  Oxygen Delivery Room Air/ 21 %    08:00  Temperature Temperature (F) 98.2  Celsius 36.7  Pulse Pulse 79  Respirations Respirations 20  Systolic BP Systolic BP 211  Diastolic BP (mmHg) Diastolic BP (mmHg) 53  Mean BP 72  Pulse Ox % Pulse Ox % 92  Oxygen Delivery Room Air/ 21 %  *Intake and Output.:   27-Apr-14 11:26  Stool  2- lg. soft   Brief Assessment:  Respiratory clear BS   Gastrointestinal details normal Soft  Nontender  Nondistended  No masses palpable  Bowel sounds normal   Lab Results: Routine Chem:  27-Apr-14 05:26   Glucose, Serum  148  BUN  21  Creatinine (comp) 1.02  Sodium, Serum  146  Potassium, Serum 4.4  Chloride, Serum  112  CO2, Serum 30  Calcium (Total), Serum 8.9  Anion Gap  4  Osmolality (calc) 296  eGFR (African American)  59  eGFR (Non-African American)  51 (eGFR values <84m/min/1.73 m2 may be an indication of chronic kidney disease (CKD). Calculated eGFR is useful in patients with stable renal function. The eGFR calculation will not be reliable in acutely ill patients when serum creatinine is changing rapidly. It is not useful in  patients on dialysis. The eGFR calculation may not be applicable to patients at the low and high extremes of body sizes, pregnant women, and vegetarians.)  Routine Hem:  27-Apr-14 05:26   Hemoglobin (CBC)  9.4 (Result(s) reported on 01 Mar 2013 at [facility])   Radiology Results: Nuclear Med:    26-Apr-14  12:43, GI Blood Loss Study - Nuc Med  GI Blood Loss Study - Nuc Med   REASON FOR EXAM:    rectal bleeding  COMMENTS:       PROCEDURE: NM  - NM GI BLOOD LOSS STUDY  - Feb 28 2013 12:43PM     RESULT: Bleeding Scan    Procedure: GI bleeding scan dated 02/28/2013    Indication: Rectal bleeding    Comparison: None    Radiopharmaceutical: Tc-981magged RBC administered intravenously. 3 ml   of pyrophosphate was utilized for tagging.  Technique: Dynamic, anterior planar images of the abdomen were obtained   over 60 minutes.    Findings: No abnormal focus of activity is demonstrated in the bowel to   suggest a lower GI bleed. Normal physiologic biodistribution of the   radiotracer is demonstrated throughout the abdomen.    IMPRESSION:      No scintigraphic evidence for a lower GI bleed.    Dictation Site: 1        Verified By: HEJennette BankerM.D., MD   Assessment/Plan:  Assessment/Plan:  Assessment 1) rectal bleeding in the setting of h/o Ulcerative Colitis. stable no recurrence. DDx includes exacerbation of uc, however more likely anal outlet/hemorrhoidal type bleeding since there had been no repeat.   Plan 1) will prep for flex sig tomorrow pm.  further recs to  follow.   Electronic Signatures: Loistine Simas (MD)  (Signed 27-Apr-14 13:17)  Authored: Chief Complaint, VITAL SIGNS/ANCILLARY NOTES, Brief Assessment, Lab Results, Radiology Results, Assessment/Plan   Last Updated: 27-Apr-14 13:17 by Loistine Simas (MD)

## 2015-03-14 DIAGNOSIS — R0789 Other chest pain: Secondary | ICD-10-CM | POA: Diagnosis not present

## 2015-03-14 DIAGNOSIS — J449 Chronic obstructive pulmonary disease, unspecified: Secondary | ICD-10-CM | POA: Diagnosis not present

## 2015-03-14 DIAGNOSIS — M549 Dorsalgia, unspecified: Secondary | ICD-10-CM | POA: Diagnosis not present

## 2015-03-14 DIAGNOSIS — N182 Chronic kidney disease, stage 2 (mild): Secondary | ICD-10-CM | POA: Diagnosis not present

## 2015-03-14 DIAGNOSIS — E784 Other hyperlipidemia: Secondary | ICD-10-CM | POA: Diagnosis not present

## 2015-03-21 DIAGNOSIS — R079 Chest pain, unspecified: Secondary | ICD-10-CM | POA: Diagnosis not present

## 2015-03-21 DIAGNOSIS — G450 Vertebro-basilar artery syndrome: Secondary | ICD-10-CM | POA: Diagnosis not present

## 2015-05-16 DIAGNOSIS — E784 Other hyperlipidemia: Secondary | ICD-10-CM | POA: Diagnosis not present

## 2015-05-16 DIAGNOSIS — M159 Polyosteoarthritis, unspecified: Secondary | ICD-10-CM | POA: Diagnosis not present

## 2015-05-16 DIAGNOSIS — R200 Anesthesia of skin: Secondary | ICD-10-CM | POA: Diagnosis not present

## 2015-05-16 DIAGNOSIS — N182 Chronic kidney disease, stage 2 (mild): Secondary | ICD-10-CM | POA: Diagnosis not present

## 2015-06-14 DIAGNOSIS — M159 Polyosteoarthritis, unspecified: Secondary | ICD-10-CM | POA: Diagnosis not present

## 2015-06-14 DIAGNOSIS — Z87891 Personal history of nicotine dependence: Secondary | ICD-10-CM | POA: Diagnosis not present

## 2015-06-14 DIAGNOSIS — R6884 Jaw pain: Secondary | ICD-10-CM | POA: Diagnosis not present

## 2015-06-14 DIAGNOSIS — G509 Disorder of trigeminal nerve, unspecified: Secondary | ICD-10-CM | POA: Diagnosis not present

## 2015-06-21 DIAGNOSIS — N182 Chronic kidney disease, stage 2 (mild): Secondary | ICD-10-CM | POA: Diagnosis not present

## 2015-06-21 DIAGNOSIS — R6884 Jaw pain: Secondary | ICD-10-CM | POA: Diagnosis not present

## 2015-06-21 DIAGNOSIS — M159 Polyosteoarthritis, unspecified: Secondary | ICD-10-CM | POA: Diagnosis not present

## 2015-06-21 DIAGNOSIS — R2990 Unspecified symptoms and signs involving the nervous system: Secondary | ICD-10-CM | POA: Diagnosis not present

## 2015-06-28 DIAGNOSIS — G44091 Other trigeminal autonomic cephalgias (TAC), intractable: Secondary | ICD-10-CM | POA: Diagnosis not present

## 2015-06-28 DIAGNOSIS — G500 Trigeminal neuralgia: Secondary | ICD-10-CM | POA: Diagnosis not present

## 2015-06-28 DIAGNOSIS — G518 Other disorders of facial nerve: Secondary | ICD-10-CM | POA: Diagnosis not present

## 2015-06-28 DIAGNOSIS — R6884 Jaw pain: Secondary | ICD-10-CM | POA: Diagnosis not present

## 2015-06-29 ENCOUNTER — Other Ambulatory Visit: Payer: Self-pay | Admitting: Internal Medicine

## 2015-06-29 DIAGNOSIS — G500 Trigeminal neuralgia: Secondary | ICD-10-CM

## 2015-07-05 ENCOUNTER — Observation Stay
Admission: EM | Admit: 2015-07-05 | Discharge: 2015-07-08 | Disposition: A | Discharge status: Home / Self Care | Payer: Medicare Other | Attending: Internal Medicine | Admitting: Internal Medicine

## 2015-07-05 ENCOUNTER — Ambulatory Visit
Admission: RE | Admit: 2015-07-05 | Discharge: 2015-07-05 | Disposition: A | Discharge status: Home / Self Care | Payer: Medicare Other | Source: Ambulatory Visit | Attending: Internal Medicine | Admitting: Internal Medicine

## 2015-07-05 ENCOUNTER — Encounter: Payer: Self-pay | Admitting: Emergency Medicine

## 2015-07-05 DIAGNOSIS — K529 Noninfective gastroenteritis and colitis, unspecified: Principal | ICD-10-CM | POA: Insufficient documentation

## 2015-07-05 DIAGNOSIS — Z79899 Other long term (current) drug therapy: Secondary | ICD-10-CM | POA: Insufficient documentation

## 2015-07-05 DIAGNOSIS — K5190 Ulcerative colitis, unspecified, without complications: Secondary | ICD-10-CM | POA: Insufficient documentation

## 2015-07-05 DIAGNOSIS — M879 Osteonecrosis, unspecified: Secondary | ICD-10-CM | POA: Diagnosis not present

## 2015-07-05 DIAGNOSIS — D649 Anemia, unspecified: Secondary | ICD-10-CM | POA: Diagnosis not present

## 2015-07-05 DIAGNOSIS — K922 Gastrointestinal hemorrhage, unspecified: Secondary | ICD-10-CM

## 2015-07-05 DIAGNOSIS — F17200 Nicotine dependence, unspecified, uncomplicated: Secondary | ICD-10-CM | POA: Diagnosis not present

## 2015-07-05 DIAGNOSIS — Z803 Family history of malignant neoplasm of breast: Secondary | ICD-10-CM | POA: Diagnosis not present

## 2015-07-05 DIAGNOSIS — Z7982 Long term (current) use of aspirin: Secondary | ICD-10-CM | POA: Diagnosis not present

## 2015-07-05 DIAGNOSIS — D62 Acute posthemorrhagic anemia: Secondary | ICD-10-CM | POA: Diagnosis not present

## 2015-07-05 DIAGNOSIS — R0602 Shortness of breath: Secondary | ICD-10-CM | POA: Diagnosis not present

## 2015-07-05 DIAGNOSIS — J449 Chronic obstructive pulmonary disease, unspecified: Secondary | ICD-10-CM | POA: Insufficient documentation

## 2015-07-05 DIAGNOSIS — D638 Anemia in other chronic diseases classified elsewhere: Secondary | ICD-10-CM | POA: Diagnosis not present

## 2015-07-05 DIAGNOSIS — Z8669 Personal history of other diseases of the nervous system and sense organs: Secondary | ICD-10-CM | POA: Insufficient documentation

## 2015-07-05 DIAGNOSIS — E785 Hyperlipidemia, unspecified: Secondary | ICD-10-CM | POA: Diagnosis not present

## 2015-07-05 DIAGNOSIS — K5090 Crohn's disease, unspecified, without complications: Secondary | ICD-10-CM | POA: Insufficient documentation

## 2015-07-05 DIAGNOSIS — Z720 Tobacco use: Secondary | ICD-10-CM | POA: Diagnosis not present

## 2015-07-05 DIAGNOSIS — G500 Trigeminal neuralgia: Secondary | ICD-10-CM

## 2015-07-05 DIAGNOSIS — I10 Essential (primary) hypertension: Secondary | ICD-10-CM | POA: Insufficient documentation

## 2015-07-05 HISTORY — DX: Crohn's disease, unspecified, without complications: K50.90

## 2015-07-05 HISTORY — DX: Anemia, unspecified: D64.9

## 2015-07-05 HISTORY — DX: Hyperlipidemia, unspecified: E78.5

## 2015-07-05 HISTORY — DX: Guillain-Barre syndrome: G61.0

## 2015-07-05 HISTORY — DX: Idiopathic aseptic necrosis of unspecified femur: M87.059

## 2015-07-05 HISTORY — DX: Chronic obstructive pulmonary disease, unspecified: J44.9

## 2015-07-05 HISTORY — DX: Essential (primary) hypertension: I10

## 2015-07-05 LAB — COMPREHENSIVE METABOLIC PANEL
ALBUMIN: 3.3 g/dL — AB (ref 3.5–5.0)
ALK PHOS: 55 U/L (ref 38–126)
ALT: 8 U/L — AB (ref 14–54)
AST: 16 U/L (ref 15–41)
Anion gap: 9 (ref 5–15)
BUN: 32 mg/dL — ABNORMAL HIGH (ref 6–20)
CALCIUM: 10.4 mg/dL — AB (ref 8.9–10.3)
CO2: 33 mmol/L — AB (ref 22–32)
CREATININE: 1.09 mg/dL — AB (ref 0.44–1.00)
Chloride: 103 mmol/L (ref 101–111)
GFR calc Af Amer: 52 mL/min — ABNORMAL LOW (ref >60)
GFR calc non Af Amer: 45 mL/min — ABNORMAL LOW (ref >60)
GLUCOSE: 111 mg/dL — AB (ref 65–99)
Potassium: 4.2 mmol/L (ref 3.5–5.1)
SODIUM: 145 mmol/L (ref 135–145)
Total Bilirubin: 0.4 mg/dL (ref 0.3–1.2)
Total Protein: 6.6 g/dL (ref 6.5–8.1)

## 2015-07-05 LAB — CBC WITH DIFFERENTIAL/PLATELET
BASOS PCT: 1 %
Basophils Absolute: 0 10*3/uL (ref 0–0.1)
EOS ABS: 0.1 10*3/uL (ref 0–0.7)
Eosinophils Relative: 1 %
HEMATOCRIT: 35.3 % (ref 35.0–47.0)
Hemoglobin: 11.5 g/dL — ABNORMAL LOW (ref 12.0–16.0)
Lymphocytes Relative: 17 %
Lymphs Abs: 1.1 10*3/uL (ref 1.0–3.6)
MCH: 30.9 pg (ref 26.0–34.0)
MCHC: 32.7 g/dL (ref 32.0–36.0)
MCV: 94.5 fL (ref 80.0–100.0)
MONO ABS: 0.5 10*3/uL (ref 0.2–0.9)
MONOS PCT: 7 %
Neutro Abs: 5.1 10*3/uL (ref 1.4–6.5)
Neutrophils Relative %: 74 %
Platelets: 361 10*3/uL (ref 150–440)
RBC: 3.74 MIL/uL — ABNORMAL LOW (ref 3.80–5.20)
RDW: 16.6 % — AB (ref 11.5–14.5)
WBC: 6.8 10*3/uL (ref 3.6–11.0)

## 2015-07-05 LAB — PROTIME-INR
INR: 1.05
PROTHROMBIN TIME: 13.9 s (ref 11.4–15.0)

## 2015-07-05 LAB — HEMOGLOBIN
HEMOGLOBIN: 9.5 g/dL — AB (ref 12.0–16.0)
Hemoglobin: 10.8 g/dL — ABNORMAL LOW (ref 12.0–16.0)

## 2015-07-05 LAB — TYPE AND SCREEN
ABO/RH(D): A NEG
Antibody Screen: NEGATIVE

## 2015-07-05 LAB — APTT: aPTT: 31 seconds (ref 24–36)

## 2015-07-05 MED ORDER — OXYCODONE HCL 5 MG PO TABS: 5.0000 mg | ORAL_TABLET | Freq: Four times a day (QID) | ORAL | Status: DC | PRN

## 2015-07-05 MED ORDER — ZOLPIDEM TARTRATE 5 MG PO TABS
5.0000 mg | ORAL_TABLET | Freq: Every evening | ORAL | Status: DC | PRN
  Administered 2015-07-05 – 2015-07-07 (×3): 5 mg via ORAL
  Filled 2015-07-05 (×3): qty 1

## 2015-07-05 MED ORDER — CALCIUM CARBONATE-VITAMIN D 500-200 MG-UNIT PO TABS
1.0000 | ORAL_TABLET | Freq: Two times a day (BID) | ORAL | Status: DC
  Administered 2015-07-05 – 2015-07-08 (×5): 1 via ORAL
  Filled 2015-07-05 (×5): qty 1

## 2015-07-05 MED ORDER — MESALAMINE 400 MG PO CPDR
800.0000 mg | DELAYED_RELEASE_CAPSULE | Freq: Three times a day (TID) | ORAL | Status: DC
  Administered 2015-07-05 – 2015-07-08 (×7): 800 mg via ORAL
  Filled 2015-07-05 (×9): qty 2

## 2015-07-05 MED ORDER — MESALAMINE 800 MG PO TBEC: 1600.0000 mg | DELAYED_RELEASE_TABLET | Freq: Three times a day (TID) | ORAL | Status: DC

## 2015-07-05 MED ORDER — PANTOPRAZOLE SODIUM 40 MG IV SOLR
40.0000 mg | Freq: Two times a day (BID) | INTRAVENOUS | Status: DC
  Administered 2015-07-05 – 2015-07-06 (×2): 40 mg via INTRAVENOUS
  Filled 2015-07-05 (×4): qty 40

## 2015-07-05 MED ORDER — SODIUM CHLORIDE 0.9 % IJ SOLN
3.0000 mL | Freq: Two times a day (BID) | INTRAMUSCULAR | Status: DC
  Administered 2015-07-07 – 2015-07-08 (×2): 3 mL via INTRAVENOUS

## 2015-07-05 MED ORDER — GABAPENTIN 100 MG PO CAPS
200.0000 mg | ORAL_CAPSULE | Freq: Every day | ORAL | Status: DC
  Administered 2015-07-05 – 2015-07-07 (×3): 200 mg via ORAL
  Filled 2015-07-05 (×3): qty 2

## 2015-07-05 MED ORDER — SODIUM CHLORIDE 0.9 % IV SOLN
INTRAVENOUS | Status: DC
  Administered 2015-07-05 – 2015-07-07 (×3): via INTRAVENOUS

## 2015-07-05 NOTE — ED Provider Notes (Signed)
Time Seen: Approximately 1915  I have reviewed the triage notes  Chief Complaint: Rectal Bleeding   History of Present Illness: Lindsey Hobbs is a 81 y.o. female who presents after bright red blood per rectum on 2 separate bowel movements this morning. She states her was no stool mixed in with a bright red blood. No obvious clots were present. She states she had one episode of rectal bleeding last night. She denies any abdominal pain. States she's had some mild generalized weakness without any syncopal episode. She denies any nausea, vomiting, or rectal pain. She is not currently on any significant anticoagulation therapy.   Past Medical History  Diagnosis Date  . Crohn's disease     Patient Active Problem List   Diagnosis Date Noted  . GI bleed 07/05/2015    Past Surgical History  Procedure Laterality Date  . Abdominal hysterectomy      Past Surgical History  Procedure Laterality Date  . Abdominal hysterectomy      No current outpatient prescriptions on file.  Allergies:  Review of patient's allergies indicates no known allergies.  Family History: Family History  Problem Relation Age of Onset  . Breast cancer Mother     Social History: Social History  Substance Use Topics  . Smoking status: Current Every Day Smoker -- 0.50 packs/day  . Smokeless tobacco: None  . Alcohol Use: No     Review of Systems:   10 point review of systems was performed and was otherwise negative:  Constitutional: No fever Eyes: No visual disturbances ENT: No sore throat, ear pain Cardiac: No chest pain Respiratory: No shortness of breath, wheezing, or stridor Abdomen: No abdominal pain, no vomiting, No diarrhea Endocrine: No weight loss, No night sweats Extremities: No peripheral edema, cyanosis Skin: No rashes, easy bruising Neurologic: No focal weakness, trouble with speech or swollowing Urologic: No dysuria, Hematuria, or urinary frequency   Physical Exam:  ED  Triage Vitals  Enc Vitals Group     BP 07/05/15 1317 145/62 mmHg     Pulse Rate 07/05/15 1317 87     Resp 07/05/15 1317 18     Temp 07/05/15 1317 98.2 F (36.8 C)     Temp Source 07/05/15 1317 Oral     SpO2 07/05/15 1317 94 %     Weight 07/05/15 1317 130 lb (58.968 kg)     Height 07/05/15 1317 5\' 4"  (1.626 m)     Head Cir --      Peak Flow --      Pain Score 07/05/15 1630 8     Pain Loc --      Pain Edu? --      Excl. in [place]? --     General: Awake , Alert , and Oriented times 3; GCS 15 Head: Normal cephalic , atraumatic Eyes: Pupils equal , round, reactive to light Nose/Throat: No nasal drainage, patent upper airway without erythema or exudate.  Neck: Supple, Full range of motion, No anterior adenopathy or palpable thyroid masses Lungs: Clear to ascultation without wheezes , rhonchi, or rales Heart: Regular rate, regular rhythm without murmurs , gallops , or rubs Abdomen: Soft, non tender without rebound, guarding , or rigidity; bowel sounds positive and symmetric in all 4 quadrants. No organomegaly .        Extremities: 2 plus symmetric pulses. No edema, clubbing or cyanosis Neurologic: normal ambulation, Motor symmetric without deficits, sensory intact Skin: warm, dry, no rashes Rectal exam: With chaperone present patient had  a rectal exam which was guaiac positive with a bright red to slightly maroon colored stool on the rectal wall. She had normal sphincter tone with no palpable masses small and internal hemorrhoid on the posterior wall  Labs:   All laboratory work was reviewed including any pertinent negatives or positives listed below:  Labs Reviewed  COMPREHENSIVE METABOLIC PANEL - Abnormal; Notable for the following:    CO2 33 (*)    Glucose, Bld 111 (*)    BUN 32 (*)    Creatinine, Ser 1.09 (*)    Calcium 10.4 (*)    Albumin 3.3 (*)    ALT 8 (*)    GFR calc non Af Amer 45 (*)    GFR calc Af Amer 52 (*)    All other components within normal limits  CBC WITH  DIFFERENTIAL/PLATELET - Abnormal; Notable for the following:    RBC 3.74 (*)    Hemoglobin 11.5 (*)    RDW 16.6 (*)    All other components within normal limits  HEMOGLOBIN - Abnormal; Notable for the following:    Hemoglobin 10.8 (*)    All other components within normal limits  PROTIME-INR  APTT  HEMOGLOBIN  PROTIME-INR  BASIC METABOLIC PANEL  CBC  HEMOGLOBIN  TYPE AND SCREEN  ABO/RH   patient's hemoglobin is stable at this point I felt did not require transfusion.      ED Course: Differential for lower gastrointestinal bleeding includes acute diverticulosis, exacerbation of Crohn's disease with a history of inflammatory bowel disease, internal hemorrhoids, rectal or colon cancer, etc. Patient remained hemodynamically stable at this time her hemoglobin is stable and she is not hypotensive or tachycardic.   Assessment: Acute lower gastrointestinal bleed   Final Clinical Impression: Acute lower gastrointestinal bleed  Final diagnoses:  Acute lower GI bleeding     Plan: Patient's case was reviewed with the hospitalist team, plan is for admission.         Go to top  Daymon Larsen, MD 07/05/15 2237

## 2015-07-05 NOTE — ED Notes (Signed)
Reports bright red bleeding from rectum onset last pm.  Denies pain. Skin w/d with good color.

## 2015-07-05 NOTE — H&P (Signed)
[facility] [place] NAME: Lindsey Hobbs    MR#:  [id number]  DATE OF BIRTH:  1927/11/02  DATE OF ADMISSION:  07/05/2015  PRIMARY CARE PHYSICIAN: Cletis Athens, MD   REQUESTING/REFERRING PHYSICIAN: Nile Riggs  CHIEF COMPLAINT:   Chief Complaint  Patient presents with  . Rectal Bleeding    HISTORY OF PRESENT ILLNESS: Lindsey Hobbs  is a 81 y.o. female with a known history of Crohn's disease had episode of rectal bleed 2 years ago, came to emergency room after having episode of the bright red blood per rectum since last night she had trouble 3 episodes still now. She denies any nausea vomiting diarrhea or constipation, abdominal pain. Her hemoglobin is stable in emergency room she takes aspirin 81 mg at home but denies using any pain medications, last colonoscopy was a few years ago.  PAST MEDICAL HISTORY:   Past Medical History  Diagnosis Date  . Crohn's disease     PAST SURGICAL HISTORY: History reviewed. No pertinent past surgical history.  SOCIAL HISTORY:  Social History  Substance Use Topics  . Smoking status: Current Every Day Smoker -- 0.50 packs/day  . Smokeless tobacco: Not on file  . Alcohol Use: No    FAMILY HISTORY:  Family History  Problem Relation Age of Onset  . Breast cancer Mother     DRUG ALLERGIES: No Known Allergies  REVIEW OF SYSTEMS:   CONSTITUTIONAL: No fever, fatigue or weakness.  EYES: No blurred or double vision.  EARS, NOSE, AND THROAT: No tinnitus or ear pain.  RESPIRATORY: No cough, shortness of breath, wheezing or hemoptysis.  CARDIOVASCULAR: No chest pain, orthopnea, edema.  GASTROINTESTINAL: No nausea, vomiting, diarrhea or abdominal pain. Blood in stool. GENITOURINARY: No dysuria, hematuria.  ENDOCRINE: No polyuria, nocturia,  HEMATOLOGY: No anemia, easy bruising or bleeding SKIN: No rash or lesion. MUSCULOSKELETAL: No joint pain or arthritis.   NEUROLOGIC: No tingling, numbness,  weakness.  PSYCHIATRY: No anxiety or depression.   MEDICATIONS AT HOME:  Prior to Admission medications   Medication Sig Start Date End Date Taking? Authorizing Provider  aspirin EC 81 MG tablet Take 81 mg by mouth at bedtime.   Yes Historical Provider, MD  Calcium Carbonate-Vitamin D (CALCIUM 600+D) 600-400 MG-UNIT per tablet Take 1 tablet by mouth 2 (two) times daily.   Yes Historical Provider, MD  ferrous sulfate 325 (65 FE) MG tablet Take 325 mg by mouth daily.   Yes Historical Provider, MD  fexofenadine (ALLEGRA) 180 MG tablet Take 180 mg by mouth at bedtime.   Yes Historical Provider, MD  folic acid (FOLVITE) 1 MG tablet Take 1 mg by mouth daily.   Yes Historical Provider, MD  gabapentin (NEURONTIN) 100 MG capsule Take 200 mg by mouth at bedtime.   Yes Historical Provider, MD  Mesalamine (ASACOL HD) 800 MG TBEC Take 1,600 mg by mouth 3 (three) times daily.   Yes Historical Provider, MD  Multiple Vitamin (MULTIVITAMIN WITH MINERALS) TABS tablet Take 1 tablet by mouth daily.   Yes Historical Provider, MD  Omega-3 Fatty Acids (FISH OIL PO) Take 1 capsule by mouth 3 (three) times daily.   Yes Historical Provider, MD  zolpidem (AMBIEN) 10 MG tablet Take 10 mg by mouth at bedtime as needed for sleep.   Yes Historical Provider, MD      PHYSICAL EXAMINATION:   VITAL SIGNS: Blood pressure 145/62, pulse 87, temperature 98.2 F (36.8 C), temperature source Oral, resp. rate 18, height 5\' 4"  (  1.626 m), weight 58.968 kg (130 lb), SpO2 94 %.  GENERAL:  81 y.o.-year-old patient lying in the bed with no acute distress.  EYES: Pupils equal, round, reactive to light and accommodation. No scleral icterus. Extraocular muscles intact.  HEENT: Head atraumatic, normocephalic. Oropharynx and nasopharynx clear.  NECK:  Supple, no jugular venous distention. No thyroid enlargement, no tenderness.  LUNGS: Normal breath sounds bilaterally, no wheezing, rales,rhonchi or crepitation. No use of accessory muscles of  respiration.  CARDIOVASCULAR: S1, S2 normal. No murmurs, rubs, or gallops.  ABDOMEN: Soft, nontender, nondistended. Bowel sounds present. No organomegaly or mass.  EXTREMITIES: No pedal edema, cyanosis, or clubbing.  NEUROLOGIC: Cranial nerves II through XII are intact. Muscle strength 4/5 in all extremities. Sensation intact. Gait not checked.  PSYCHIATRIC: The patient is alert and oriented x 3.  SKIN: No obvious rash, lesion, or ulcer.   LABORATORY PANEL:   CBC  Recent Labs Lab 07/05/15 1321  WBC 6.8  HGB 11.5*  HCT 35.3  PLT 361  MCV 94.5  MCH 30.9  MCHC 32.7  RDW 16.6*  LYMPHSABS 1.1  MONOABS 0.5  EOSABS 0.1  BASOSABS 0.0   ------------------------------------------------------------------------------------------------------------------  Chemistries   Recent Labs Lab 07/05/15 1321  NA 145  K 4.2  CL 103  CO2 33*  GLUCOSE 111*  BUN 32*  CREATININE 1.09*  CALCIUM 10.4*  AST 16  ALT 8*  ALKPHOS 55  BILITOT 0.4   ------------------------------------------------------------------------------------------------------------------ estimated creatinine clearance is 32.6 mL/min (by C-G formula based on Cr of 1.09). ------------------------------------------------------------------------------------------------------------------ No results for input(s): TSH, T4TOTAL, T3FREE, THYROIDAB in the last 72 hours.  Invalid input(s): FREET3   Coagulation profile No results for input(s): INR, PROTIME in the last 168 hours. ------------------------------------------------------------------------------------------------------------------- No results for input(s): DDIMER in the last 72 hours. -------------------------------------------------------------------------------------------------------------------  Cardiac Enzymes No results for input(s): CKMB, TROPONINI, MYOGLOBIN in the last 168 hours.  Invalid input(s):  CK ------------------------------------------------------------------------------------------------------------------ Invalid input(s): POCBNP  ---------------------------------------------------------------------------------------------------------------  Urinalysis    Component Value Date/Time   COLORURINE Yellow 02/27/2013 2045   APPEARANCEUR Clear 02/27/2013 2045   LABSPEC 1.012 02/27/2013 2045   PHURINE 8.0 02/27/2013 2045   GLUCOSEU Negative 02/27/2013 2045   HGBUR Negative 02/27/2013 2045   BILIRUBINUR Negative 02/27/2013 2045   KETONESUR Negative 02/27/2013 2045   PROTEINUR Negative 02/27/2013 2045   NITRITE Negative 02/27/2013 2045   LEUKOCYTESUR Negative 02/27/2013 2045     RADIOLOGY: No results found.  IMPRESSION AND PLAN:  * GI bleed, likely lower GI secondary to Crohn's disease  I will stop aspirin, avoid any anticoagulants.  Will keep nothing by mouth, and give Protonix IV. GI consult for further management.  Continue Asacol for now.  * Anemia  Hemoglobin is 11, continue oral iron supplement.  * Smoking  Counseled for 4 minutes to quit, she is not willing to quit and she would not like to have any patch in the hospital either.  All the records are reviewed and case discussed with ED provider. Management plans discussed with the patient, family and they are in agreement.  CODE STATUS: DO NOT RESUSCITATE    TOTAL TIME TAKING CARE OF THIS PATIENT: 50 minutes.    Vaughan Basta M.D on 07/05/2015   Between 7am to 6pm - Pager - [phone]  After 6pm go to [email or web address] - password [email or web address] [facility]  Office  [phone]  CC: Primary care physician; Cletis Athens, MD

## 2015-07-05 NOTE — ED Notes (Addendum)
The hemoglobin at 1633 was held per hospitalist's Dr. Raliegh Ip  order.

## 2015-07-05 NOTE — ED Notes (Signed)
Call made to lab and correct tube was available for INR-PT, APTT test.

## 2015-07-06 ENCOUNTER — Encounter: Payer: Self-pay | Admitting: Internal Medicine

## 2015-07-06 DIAGNOSIS — K922 Gastrointestinal hemorrhage, unspecified: Secondary | ICD-10-CM | POA: Diagnosis not present

## 2015-07-06 DIAGNOSIS — D649 Anemia, unspecified: Secondary | ICD-10-CM | POA: Diagnosis not present

## 2015-07-06 DIAGNOSIS — Z716 Tobacco abuse counseling: Secondary | ICD-10-CM | POA: Diagnosis not present

## 2015-07-06 DIAGNOSIS — K529 Noninfective gastroenteritis and colitis, unspecified: Secondary | ICD-10-CM | POA: Diagnosis not present

## 2015-07-06 DIAGNOSIS — K51911 Ulcerative colitis, unspecified with rectal bleeding: Secondary | ICD-10-CM | POA: Diagnosis not present

## 2015-07-06 LAB — PROTIME-INR
INR: 1.09
PROTHROMBIN TIME: 14.3 s (ref 11.4–15.0)

## 2015-07-06 LAB — CBC
HCT: 30.8 % — ABNORMAL LOW (ref 35.0–47.0)
HEMOGLOBIN: 9.9 g/dL — AB (ref 12.0–16.0)
MCH: 30.5 pg (ref 26.0–34.0)
MCHC: 32.2 g/dL (ref 32.0–36.0)
MCV: 94.8 fL (ref 80.0–100.0)
PLATELETS: 335 10*3/uL (ref 150–440)
RBC: 3.25 MIL/uL — AB (ref 3.80–5.20)
RDW: 16.1 % — ABNORMAL HIGH (ref 11.5–14.5)
WBC: 5.9 10*3/uL (ref 3.6–11.0)

## 2015-07-06 LAB — BASIC METABOLIC PANEL
ANION GAP: 7 (ref 5–15)
BUN: 21 mg/dL — ABNORMAL HIGH (ref 6–20)
CALCIUM: 9.3 mg/dL (ref 8.9–10.3)
CO2: 31 mmol/L (ref 22–32)
CREATININE: 0.91 mg/dL (ref 0.44–1.00)
Chloride: 108 mmol/L (ref 101–111)
GFR, EST NON AFRICAN AMERICAN: 56 mL/min — AB (ref >60)
Glucose, Bld: 100 mg/dL — ABNORMAL HIGH (ref 65–99)
Potassium: 3.8 mmol/L (ref 3.5–5.1)
SODIUM: 146 mmol/L — AB (ref 135–145)

## 2015-07-06 LAB — C-REACTIVE PROTEIN: CRP: 1.3 mg/dL — ABNORMAL HIGH (ref <1.0)

## 2015-07-06 LAB — ABO/RH: ABO/RH(D): A NEG

## 2015-07-06 LAB — SEDIMENTATION RATE: SED RATE: 44 mm/h — AB (ref 0–30)

## 2015-07-06 MED ORDER — SODIUM CHLORIDE 0.9 % IV SOLN: INTRAVENOUS | Status: DC

## 2015-07-06 MED ORDER — POLYETHYLENE GLYCOL 3350 17 GM/SCOOP PO POWD
1.0000 | Freq: Once | ORAL | Status: AC
  Administered 2015-07-06: 255 g via ORAL
  Filled 2015-07-06: qty 255

## 2015-07-06 MED ORDER — PANTOPRAZOLE SODIUM 40 MG PO TBEC
40.0000 mg | DELAYED_RELEASE_TABLET | Freq: Every day | ORAL | Status: DC
  Administered 2015-07-07 – 2015-07-08 (×2): 40 mg via ORAL
  Filled 2015-07-06 (×2): qty 1

## 2015-07-06 MED ORDER — POLYVINYL ALCOHOL 1.4 % OP SOLN
1.0000 [drp] | OPHTHALMIC | Status: DC | PRN
  Filled 2015-07-06: qty 15

## 2015-07-06 NOTE — Plan of Care (Signed)
Problem: Discharge Progression Outcomes Goal: Pain controlled with appropriate interventions Outcome: Progressing Pt doesn't c/o pain w/crohn's disease.  No N&V or diarrhea. Goal: Complications resolved/controlled Outcome: Progressing Pt will have colonoscopy tomorrow.  Goal: Tolerating diet Outcome: Progressing Pt advanced to clear liquids. Goal: Activity appropriate for discharge plan Outcome: Progressing 1 asst to [facility] Goal: Other Discharge Outcomes/Goals Outcome: Progressing Hgb q 8 hrs. Stabilized at 9.9.  No transfusion at this time. Sed rate and CRP to be checked.

## 2015-07-06 NOTE — Consult Note (Signed)
  Pt seen and examined. Please see C. Celesta Aver' notes. Last sigmoidoscopy in 2014 showed active colitis with bx more consistent with ulcerative colitis. Now with acute rectal bleeding. Full colonoscopy not done last time due to sigmoid stricture. Unusual for colitis to cause gross rectal bleeding, esp when pt had been stable on asacol 6 tabs dailiy. Will try to set pt for colonoscopy to see if bleeding from diverticulosis.

## 2015-07-06 NOTE — Consult Note (Signed)
GI Inpatient Consult Note  Reason for Consult: GI Bleed/ Crohn's Disease   Attending Requesting Consult: Dr. Anselm Jungling  History of Present Illness: Lindsey Hobbs is a 81 y.o. female  who reports that 2 nights ago after eating she felt that she had to have a bowel movement, went to use the commode and it was bright red blood.  The next morning she went again twice again commode full of blood.  She reported to the emergency department at [facility].  After being admitted she had another episode last night and again this morning at 7:00 a.m.[name]  She denies any stool along with a blood.  Denies nausea, vomiting, diarrhea, abdominal pain (with the exception of transient luq pain), and denies cramping.  She reports that she had a similar episode 2 or 3 years ago.  She reports she was diagnosed with Crohn's disease many years ago.  She reports feeling very weak, attributes this to not eating in the last couple of days.  She takes a daily baby aspirin, daily iron, and is on Asacol 1600 mg 3 times a day.  Upon further review of her medical records, she was last seen at [facility] clinic in the GI department on Mar 23, 2013. She was seen for hospitalization follow-up due to rectal bleeding.  While she was hospitalized she had a flex sig that demonstrated a sigmoid stricture that, per pathology report, was active colitis with ulcerations/features of chronicity.  She also had some chronic inflammation in the rectum.  There is no dysplasia or malignancy found.  She stated at that time that she was feeling very well.  They had discussed her results at that time and that she would likely need a medication change for more  Efficacy of her ulcerative colitis.  She had an IBD deep panel done at the hospital that time that indicated results consistent with ulcerative colitis.  She was started on Asacol 1600 mg 3 times a day.  Past Medical History:  Past Medical History  Diagnosis Date  . Crohn's disease      Problem List: Patient Active Problem List   Diagnosis Date Noted  . GI bleed 07/05/2015    Past Surgical History: Past Surgical History  Procedure Laterality Date  . Abdominal hysterectomy      Allergies: No Known Allergies  Home Medications: Prescriptions prior to admission  Medication Sig Dispense Refill Last Dose  . aspirin EC 81 MG tablet Take 81 mg by mouth at bedtime.   07/04/2015 at [place]  . Calcium Carbonate-Vitamin D (CALCIUM 600+D) 600-400 MG-UNIT per tablet Take 1 tablet by mouth 2 (two) times daily.   07/04/2015 at Unknown time  . ferrous sulfate 325 (65 FE) MG tablet Take 325 mg by mouth daily.   07/04/2015 at Unknown time  . fexofenadine (ALLEGRA) 180 MG tablet Take 180 mg by mouth at bedtime.   07/04/2015 at Unknown time  . folic acid (FOLVITE) 1 MG tablet Take 1 mg by mouth daily.   07/04/2015 at Unknown time  . gabapentin (NEURONTIN) 100 MG capsule Take 200 mg by mouth at bedtime.   07/04/2015 at Unknown time  . Mesalamine (ASACOL HD) 800 MG TBEC Take 1,600 mg by mouth 3 (three) times daily.   07/04/2015 at Unknown time  . Multiple Vitamin (MULTIVITAMIN WITH MINERALS) TABS tablet Take 1 tablet by mouth daily.   07/04/2015 at Unknown time  . Omega-3 Fatty Acids (FISH OIL PO) Take 1 capsule by mouth 3 (three) times daily.  07/04/2015 at Unknown time  . zolpidem (AMBIEN) 10 MG tablet Take 10 mg by mouth at bedtime as needed for sleep.   07/04/2015 at Unknown time   Home medication reconciliation was completed with the patient.   Scheduled Inpatient Medications:   . calcium-vitamin D  1 tablet Oral BID  . gabapentin  200 mg Oral QHS  . Mesalamine  800 mg Oral TID  . pantoprazole (PROTONIX) IV  40 mg Intravenous Q12H  . sodium chloride  3 mL Intravenous Q12H    Continuous Inpatient Infusions:   . sodium chloride 50 mL/hr at 07/06/15 0813    PRN Inpatient Medications:  oxyCODONE, zolpidem  Family History: family history includes Breast cancer in her mother.    Social History:   reports that she has been smoking.  She does not have any smokeless tobacco history on file. She reports that she does not drink alcohol.    Review of Systems: Constitutional: Weight is stable.  Eyes: No changes in vision. ENT: No oral lesions, sore throat.  GI: see HPI.  Heme/Lymph: No easy bruising.  CV: No chest pain.  GU: No hematuria.  Integumentary: No rashes.  Neuro: No headaches.  Psych: No depression/anxiety.  Endocrine: No heat/cold intolerance.  Allergic/Immunologic: No urticaria.  Resp: No cough, SOB.  Musculoskeletal: No joint swelling.    Physical Examination: BP 115/49 mmHg  Pulse 73  Temp(Src) 97.4 F (36.3 C) (Oral)  Resp 16  Ht 5\' 4"  (1.626 m)  Wt 59.421 kg (131 lb)  BMI 22.47 kg/m2  SpO2 91% Gen: NAD, alert and oriented x 4, fair to good historian, she does admit having memory issues. HEENT: PEERLA, EOMI, Neck: supple, no JVD or thyromegaly Chest: CTA bilaterally, no wheezes, crackles, or other adventitious sounds CV: RRR, no m/g/c/r Abd: soft,slight LUQ tenderness, ND, +BS in all four quadrants; no HSM, guarding, ridigity, or rebound tenderness Ext: no edema, well perfused with 2+ pulses, Skin: no rash or lesions noted Lymph: no LAD  Data: Lab Results  Component Value Date   WBC 6.8 07/05/2015   HGB 9.5* 07/05/2015   HCT 35.3 07/05/2015   MCV 94.5 07/05/2015   PLT 361 07/05/2015    Recent Labs Lab 07/05/15 1321 07/05/15 1727 07/05/15 2334  HGB 11.5* 10.8* 9.5*   Lab Results  Component Value Date   NA 145 07/05/2015   K 4.2 07/05/2015   CL 103 07/05/2015   CO2 33* 07/05/2015   BUN 32* 07/05/2015   CREATININE 1.09* 07/05/2015   Lab Results  Component Value Date   ALT 8* 07/05/2015   AST 16 07/05/2015   ALKPHOS 55 07/05/2015   BILITOT 0.4 07/05/2015    Recent Labs Lab 07/05/15 1321  APTT 31  INR 1.05   Assessment/Plan: Lindsey Hobbs is a 81 y.o. female with a history of ulcerative colitis and  presents with several episodes of BRBPR.  Patient had an episode similar to this in 2014. A flex sig was done at that time.  Please see HPI.  Recommendations: We will proceed with a colonoscopy tomorrow. Patient may have clear liquids along with her MiraLax prep.  We also recommend checking sed rate along with CRP.  We agree with continuing the Asacol, Protonix, and following the hemoglobin.   Thank you for the consult. Please call with questions or concerns.  Salvadore Farber, PA-C  I personally performed these services.

## 2015-07-06 NOTE — Plan of Care (Signed)
Problem: Discharge Progression Outcomes Goal: Discharge plan in place and appropriate Outcome: Progressing Individualization:  Pt lives at home alone.  Does have Home aid who helps her run errands and cleans up.  Pt uses cane for ambulation and uses stair rail at home.  Pt has crohn's disease for which she has medication.  Hasn't had flair up in a couple of years. Son is her health care POA.

## 2015-07-06 NOTE — Progress Notes (Signed)
[facility] [place] NAME: Lindsey Hobbs    MR#:  [id number]  DATE OF BIRTH:  08-18-1927  SUBJECTIVE:  CHIEF COMPLAINT:   Chief Complaint  Patient presents with  . Rectal Bleeding   Had large bloody BM earlier today. No abd pain/fever.  REVIEW OF SYSTEMS:    Review of Systems  Constitutional: Positive for malaise/fatigue. Negative for fever and chills.  HENT: Negative for sore throat.   Eyes: Negative for blurred vision, double vision and pain.  Respiratory: Negative for cough, hemoptysis, shortness of breath and wheezing.   Cardiovascular: Negative for chest pain, palpitations, orthopnea and leg swelling.  Gastrointestinal: Positive for blood in stool. Negative for heartburn, nausea, vomiting, abdominal pain, diarrhea and constipation.  Genitourinary: Negative for dysuria and hematuria.  Musculoskeletal: Negative for back pain and joint pain.  Skin: Negative for rash.  Neurological: Positive for weakness. Negative for sensory change, speech change, focal weakness and headaches.  Endo/Heme/Allergies: Does not bruise/bleed easily.  Psychiatric/Behavioral: Negative for depression. The patient is not nervous/anxious.       DRUG ALLERGIES:  No Known Allergies  VITALS:  Blood pressure 121/42, pulse 72, temperature 97.5 F (36.4 C), temperature source Oral, resp. rate 20, height 5\' 4"  (1.626 m), weight 59.421 kg (131 lb), SpO2 93 %.  PHYSICAL EXAMINATION:   Physical Exam  GENERAL:  81 y.o.-year-old patient lying in the bed with no acute distress.  EYES: Pupils equal, round, reactive to light and accommodation. No scleral icterus. Extraocular muscles intact.  HEENT: Head atraumatic, normocephalic. Oropharynx and nasopharynx clear.  NECK:  Supple, no jugular venous distention. No thyroid enlargement, no tenderness.  LUNGS: Normal breath sounds bilaterally, no wheezing, rales, rhonchi. No use of accessory muscles of  respiration.  CARDIOVASCULAR: S1, S2 normal. No murmurs, rubs, or gallops.  ABDOMEN: Soft, nontender, nondistended. Bowel sounds present. No organomegaly or mass.  EXTREMITIES: No cyanosis, clubbing or edema b/l.    NEUROLOGIC: Cranial nerves II through XII are intact. No focal Motor or sensory deficits b/l.   PSYCHIATRIC: The patient is alert and oriented x 3.  SKIN: No obvious rash, lesion, or ulcer.    LABORATORY PANEL:   CBC  Recent Labs Lab 07/06/15 0947  WBC 5.9  HGB 9.9*  HCT 30.8*  PLT 335   ------------------------------------------------------------------------------------------------------------------  Chemistries   Recent Labs Lab 07/05/15 1321 07/06/15 0947  NA 145 146*  K 4.2 3.8  CL 103 108  CO2 33* 31  GLUCOSE 111* 100*  BUN 32* 21*  CREATININE 1.09* 0.91  CALCIUM 10.4* 9.3  AST 16  --   ALT 8*  --   ALKPHOS 55  --   BILITOT 0.4  --    ------------------------------------------------------------------------------------------------------------------  Cardiac Enzymes No results for input(s): TROPONINI in the last 168 hours. ------------------------------------------------------------------------------------------------------------------  RADIOLOGY:  No results found.   ASSESSMENT AND PLAN:   * GI bleed, likely lower GI secondary to Crohn's disease Patient mentions that she does not have abdominal pain with her Crohn's disease flareup. Been stopped. On IV Protonix. GI consult.  *Acute blood loss anemia over anemia of chronic disease No need of transfusion at this time. Consent obtained from patient in case she needs any transfusion if hemoglobin less than 8.  * Smoking Counseled to quit on admission.   All the records are reviewed and case discussed with Care Management/Social Workerr. Management plans discussed with the patient, family and they are in agreement.  CODE STATUS: FULL  DVT Prophylaxis: SCDs  TOTAL TIME TAKING CARE OF  THIS PATIENT: 35 minutes.   POSSIBLE D/C IN 1-2 DAYS, DEPENDING ON CLINICAL CONDITION.   Hillary Bow R M.D on 07/06/2015 at 12:54 PM  Between 7am to 6pm - Pager - [phone]  After 6pm go to [email or web address] - password [email or web address]  Office  [phone]  CC: Primary care physician; Lindsey Athens, MD

## 2015-07-07 ENCOUNTER — Observation Stay: Payer: Medicare Other | Admitting: Certified Registered Nurse Anesthetist

## 2015-07-07 ENCOUNTER — Encounter
Admission: EM | Disposition: A | Discharge status: Home / Self Care | Payer: Self-pay | Source: Home / Self Care | Attending: Emergency Medicine

## 2015-07-07 ENCOUNTER — Encounter: Payer: Self-pay | Admitting: Internal Medicine

## 2015-07-07 DIAGNOSIS — D649 Anemia, unspecified: Secondary | ICD-10-CM | POA: Diagnosis not present

## 2015-07-07 DIAGNOSIS — K5660 Unspecified intestinal obstruction: Secondary | ICD-10-CM | POA: Diagnosis not present

## 2015-07-07 DIAGNOSIS — K5289 Other specified noninfective gastroenteritis and colitis: Secondary | ICD-10-CM | POA: Diagnosis not present

## 2015-07-07 DIAGNOSIS — K922 Gastrointestinal hemorrhage, unspecified: Secondary | ICD-10-CM | POA: Diagnosis not present

## 2015-07-07 DIAGNOSIS — I252 Old myocardial infarction: Secondary | ICD-10-CM | POA: Diagnosis not present

## 2015-07-07 DIAGNOSIS — I10 Essential (primary) hypertension: Secondary | ICD-10-CM | POA: Diagnosis not present

## 2015-07-07 DIAGNOSIS — K529 Noninfective gastroenteritis and colitis, unspecified: Secondary | ICD-10-CM | POA: Diagnosis not present

## 2015-07-07 DIAGNOSIS — K5100 Ulcerative (chronic) pancolitis without complications: Secondary | ICD-10-CM | POA: Diagnosis not present

## 2015-07-07 DIAGNOSIS — K5669 Other intestinal obstruction: Secondary | ICD-10-CM | POA: Diagnosis not present

## 2015-07-07 DIAGNOSIS — Z716 Tobacco abuse counseling: Secondary | ICD-10-CM | POA: Diagnosis not present

## 2015-07-07 HISTORY — PX: COLONOSCOPY WITH PROPOFOL: SHX5780

## 2015-07-07 LAB — HEMOGLOBIN: Hemoglobin: 8.7 g/dL — ABNORMAL LOW (ref 12.0–16.0)

## 2015-07-07 SURGERY — COLONOSCOPY WITH PROPOFOL
Anesthesia: Monitor Anesthesia Care | Laterality: Left

## 2015-07-07 SURGERY — COLONOSCOPY WITH PROPOFOL
Anesthesia: General | Laterality: Left

## 2015-07-07 MED ORDER — PREDNISONE 20 MG PO TABS
40.0000 mg | ORAL_TABLET | Freq: Every day | ORAL | Status: DC
  Administered 2015-07-08: 08:00:00 40 mg via ORAL
  Filled 2015-07-07: qty 2

## 2015-07-07 MED ORDER — SODIUM CHLORIDE 0.9 % IV SOLN
INTRAVENOUS | Status: DC
  Administered 2015-07-07: 11:00:00 via INTRAVENOUS

## 2015-07-07 MED ORDER — PROPOFOL 10 MG/ML IV BOLUS
INTRAVENOUS | Status: DC | PRN
  Administered 2015-07-07: 10 mg via INTRAVENOUS
  Administered 2015-07-07: 20 mg via INTRAVENOUS

## 2015-07-07 MED ORDER — LIDOCAINE HCL (CARDIAC) 20 MG/ML IV SOLN
INTRAVENOUS | Status: DC | PRN
  Administered 2015-07-07: 40 mg via INTRAVENOUS

## 2015-07-07 MED ORDER — PROPOFOL INFUSION 10 MG/ML OPTIME
INTRAVENOUS | Status: DC | PRN
  Administered 2015-07-07: 120 ug/kg/min via INTRAVENOUS

## 2015-07-07 NOTE — Anesthesia Postprocedure Evaluation (Signed)
  Anesthesia Post-op Note  Patient: Lindsey Hobbs  Procedure(s) Performed: Procedure(s): COLONOSCOPY WITH PROPOFOL (Left)  Anesthesia type:General  Patient location: PACU  Post pain: Pain level controlled  Post assessment: Post-op Vital signs reviewed, Patient's Cardiovascular Status Stable, Respiratory Function Stable, Patent Airway and No signs of Nausea or vomiting  Post vital signs: Reviewed and stable  Last Vitals:  Filed Vitals:   07/07/15 1200  BP: 139/71  Pulse: 75  Temp: 36.6 C  Resp: 18    Level of consciousness: awake, alert  and patient cooperative  Complications: No apparent anesthesia complications

## 2015-07-07 NOTE — Transfer of Care (Signed)
Immediate Anesthesia Transfer of Care Note  Patient: Lindsey Hobbs  Procedure(s) Performed: Procedure(s): COLONOSCOPY WITH PROPOFOL (Left)  Patient Location: PACU  Anesthesia Type:General  Level of Consciousness: awake, alert  and oriented  Airway & Oxygen Therapy: Patient Spontanous Breathing and Patient connected to nasal cannula oxygen  Post-op Assessment: Report given to RN and Post -op Vital signs reviewed and stable  Post vital signs: Reviewed and stable  Last Vitals:  Filed Vitals:   07/07/15 1116  BP: 99/54  Pulse: 75  Temp: 35.7 C  Resp: 13    Complications: No apparent anesthesia complications

## 2015-07-07 NOTE — Anesthesia Procedure Notes (Signed)
Performed by: Wayburn Shaler Pre-anesthesia Checklist: Patient identified, Emergency Drugs available, Suction available, Patient being monitored and Timeout performed Patient Re-evaluated:Patient Re-evaluated prior to inductionOxygen Delivery Method: Nasal cannula Intubation Type: IV induction       

## 2015-07-07 NOTE — OR Nursing (Signed)
Report to chris . 200cc;s of iv fluids ns.

## 2015-07-07 NOTE — Anesthesia Preprocedure Evaluation (Signed)
Anesthesia Evaluation  Patient identified by MRN, date of birth, ID band Patient awake    Reviewed: Allergy & Precautions, H&P , NPO status , Patient's Chart, lab work & pertinent test results, reviewed documented beta blocker date and time   History of Anesthesia Complications Negative for: history of anesthetic complications  Airway Mallampati: II  TM Distance: >3 FB Neck ROM: full    Dental no notable dental hx. (+) Poor Dentition   Pulmonary shortness of breath and with exertion, neg sleep apnea, COPDneg recent URI, Current Smoker,  breath sounds clear to auscultation  + decreased breath sounds      Cardiovascular Exercise Tolerance: Good hypertension, - angina- CAD, - Past MI, - Cardiac Stents and - CABG Normal cardiovascular exam- dysrhythmias - Valvular Problems/MurmursRhythm:regular Rate:Normal     Neuro/Psych  Neuromuscular disease (Guillain Barre in the past, resolved) negative neurological ROS  negative psych ROS   GI/Hepatic Neg liver ROS, Ulcerative colitis   Endo/Other  negative endocrine ROS  Renal/GU negative Renal ROS  negative genitourinary   Musculoskeletal   Abdominal   Peds  Hematology  (+) anemia ,   Anesthesia Other Findings Past Medical History:   Ulcerative colitis                                           Hypertension                                                 COPD (chronic obstructive pulmonary disease)                 Anemia                                                       Hyperlipidemia                                               Guillain Barr syndrome                                      Avascular necrosis of bone of hip                            Reproductive/Obstetrics negative OB ROS                             Anesthesia Physical Anesthesia Plan  ASA: III  Anesthesia Plan: General   Post-op Pain Management:    Induction:   Airway  Management Planned:   Additional Equipment:   Intra-op Plan:   Post-operative Plan:   Informed Consent: I have reviewed the patients History and Physical, chart, labs and discussed the procedure including the risks, benefits and alternatives for the proposed anesthesia with the patient or authorized representative who has indicated his/her  understanding and acceptance.   Dental Advisory Given  Plan Discussed with: Anesthesiologist, CRNA and Surgeon  Anesthesia Plan Comments:         Anesthesia Quick Evaluation

## 2015-07-07 NOTE — Op Note (Signed)
[facility] Patient Name: Lindsey Hobbs Procedure Date: 07/07/2015 10:46 AM MRN: [id number] Account #: [id number] Date of Birth: [birth year] Admit Type: Outpatient Age: 81 Room: [facility] ENDO ROOM 4 Gender: Female Note Status: Finalized Procedure:         Colonoscopy Indications:       Rectal bleeding Providers:         Lupita Dawn. Candace Cruise, MD Referring MD:      Cletis Athens, MD (Referring MD) Medicines:         Monitored Anesthesia Care Complications:     No immediate complications. Procedure:         Pre-Anesthesia Assessment:                    - Prior to the procedure, a History and Physical was                     performed, and patient medications, allergies and                     sensitivities were reviewed. The patient's tolerance of                     previous anesthesia was reviewed.                    - The risks and benefits of the procedure and the sedation                     options and risks were discussed with the patient. All                     questions were answered and informed consent was obtained.                    - After reviewing the risks and benefits, the patient was                     deemed in satisfactory condition to undergo the procedure.                    After obtaining informed consent, the colonoscope was                     passed under direct vision. Throughout the procedure, the                     patient's blood pressure, pulse, and oxygen saturations                     were monitored continuously. The Colonoscope was                     introduced through the anus and advanced to the the                     terminal ileum, with identification of the appendiceal                     orifice and IC valve. The colonoscopy was performed                     without difficulty. The patient tolerated the procedure  well. The quality of the bowel preparation was fair. Findings:      The terminal ileum  appeared normal. Biopsies were taken with a cold       forceps for histology.      A diffuse area of moderately altered vascular, atrophic, erythematous,       friable (with contact bleeding), scarred, ulcerated and       vascular-pattern-decreased mucosa was found in the entire colon. Areas       of narrowing in multiple areas. Colon shortened as well. Impression:        - The examined portion of the ileum was normal. Biopsied.                    - Altered vascular, atrophic, erythematous, friable (with                     contact bleeding), scarred, ulcerated and                     vascular-pattern-decreased mucosa in the entire examined                     colon. Recommendation:    - Continue present medications.                    - Await pathology results.                    - The findings and recommendations were discussed with the                     surgeon. Procedure Code(s): --- Professional ---                    [id number], Colonoscopy, flexible; with biopsy, single or                     multiple Diagnosis Code(s): --- Professional ---                    K62.5, Hemorrhage of anus and rectum CPT copyright 2014 American [employer] Association. All rights reserved. The codes documented in this report are preliminary and upon coder review may  be revised to meet current compliance requirements. Hulen Luster, MD 07/07/2015 11:14:09 AM This report has been signed electronically. Number of Addenda: 0 Note Initiated On: 07/07/2015 10:46 AM Scope Withdrawal Time: 0 hours 5 minutes 25 seconds  Total Procedure Duration: 0 hours 7 minutes 52 seconds       [facility]

## 2015-07-07 NOTE — Progress Notes (Signed)
Dr. Reece Levy report that bowel prep solution was ok for pt to drink past midnight even if pt is NPO.

## 2015-07-07 NOTE — Progress Notes (Signed)
[facility] [place] NAME: Lindsey Hobbs    MR#:  [id number]  DATE OF BIRTH:  Jul 12, 1927  SUBJECTIVE:  CHIEF COMPLAINT:   Chief Complaint  Patient presents with  . Rectal Bleeding   Colonoscopy prep overnight. No blood noticed today morning. No abdominal pain or vomiting.  REVIEW OF SYSTEMS:    Review of Systems  Constitutional: Positive for malaise/fatigue. Negative for fever and chills.  HENT: Negative for sore throat.   Eyes: Negative for blurred vision, double vision and pain.  Respiratory: Negative for cough, hemoptysis, shortness of breath and wheezing.   Cardiovascular: Negative for chest pain, palpitations, orthopnea and leg swelling.  Gastrointestinal: Positive for blood in stool. Negative for heartburn, nausea, vomiting, abdominal pain, diarrhea and constipation.  Genitourinary: Negative for dysuria and hematuria.  Musculoskeletal: Negative for back pain and joint pain.  Skin: Negative for rash.  Neurological: Positive for weakness. Negative for sensory change, speech change, focal weakness and headaches.  Endo/Heme/Allergies: Does not bruise/bleed easily.  Psychiatric/Behavioral: Negative for depression. The patient is not nervous/anxious.       DRUG ALLERGIES:  No Known Allergies  VITALS:  Blood pressure 122/60, pulse 70, temperature 96.3 F (35.7 C), temperature source Tympanic, resp. rate 21, height 5\' 4"  (1.626 m), weight 59.421 kg (131 lb), SpO2 99 %.  PHYSICAL EXAMINATION:   Physical Exam  GENERAL:  81 y.o.-year-old patient lying in the bed with no acute distress.  EYES: Pupils equal, round, reactive to light and accommodation. No scleral icterus. Extraocular muscles intact.  HEENT: Head atraumatic, normocephalic. Oropharynx and nasopharynx clear.  NECK:  Supple, no jugular venous distention. No thyroid enlargement, no tenderness.  LUNGS: Normal breath sounds bilaterally, no wheezing, rales, rhonchi.  No use of accessory muscles of respiration.  CARDIOVASCULAR: S1, S2 normal. No murmurs, rubs, or gallops.  ABDOMEN: Soft, nontender, nondistended. Bowel sounds present. No organomegaly or mass.  EXTREMITIES: No cyanosis, clubbing or edema b/l.    NEUROLOGIC: Cranial nerves II through XII are intact. No focal Motor or sensory deficits b/l.   PSYCHIATRIC: The patient is alert and oriented x 3.  SKIN: No obvious rash, lesion, or ulcer.    LABORATORY PANEL:   CBC  Recent Labs Lab 07/06/15 0947 07/07/15 0435  WBC 5.9  --   HGB 9.9* 8.7*  HCT 30.8*  --   PLT 335  --    ------------------------------------------------------------------------------------------------------------------  Chemistries   Recent Labs Lab 07/05/15 1321 07/06/15 0947  NA 145 146*  K 4.2 3.8  CL 103 108  CO2 33* 31  GLUCOSE 111* 100*  BUN 32* 21*  CREATININE 1.09* 0.91  CALCIUM 10.4* 9.3  AST 16  --   ALT 8*  --   ALKPHOS 55  --   BILITOT 0.4  --    ------------------------------------------------------------------------------------------------------------------  Cardiac Enzymes No results for input(s): TROPONINI in the last 168 hours. ------------------------------------------------------------------------------------------------------------------  RADIOLOGY:  No results found.   ASSESSMENT AND PLAN:   * GI bleed, lower GI secondary to ulcerative colitis Patient mentions that she does not have abdominal pain with her ulcerative colitis disease flareup. On Protonix. GI consulted. Colonoscopy will be showed multiple areas of ulcerations with acute and chronic colitis areas. No acute bleeding found. Start prednisone 40 mg daily and taper by 10 mg weekly.  *Acute blood loss anemia over anemia of chronic disease Hemoglobin slowly trending down due to GI bleed. Repeat in the morning. Transfuse if less than 8 due to  ongoing bleeding. Patient does have symptoms with anemia which is causing  fatigue.  * Smoking Counseled to quit on admission.  All the records are reviewed and case discussed with Care Management/Social Workerr. Management plans discussed with the patient, family and they are in agreement.  CODE STATUS: FULL  DVT Prophylaxis: SCDs  TOTAL TIME TAKING CARE OF THIS PATIENT: 35 minutes.   Likely discharge tomorrow if hemoglobin is stable and no further bowel movements. Start liquids today.  Hillary Bow R M.D on 07/07/2015 at 11:46 AM  Between 7am to 6pm - Pager - [phone]  After 6pm go to [email or web address] - password [email or web address] [facility]  Office  [phone]  CC: Primary care physician; Cletis Athens, MD

## 2015-07-07 NOTE — Plan of Care (Signed)
Problem: Discharge Progression Outcomes Goal: Pain controlled with appropriate interventions Outcome: Progressing Denies pain Goal: Hemodynamically stable Outcome: Progressing Colonoscopy today tol well  Goal: Tolerating diet Outcome: Progressing tol full liquids Goal: Activity appropriate for discharge plan Outcome: Progressing oob with assist. scds on for a while then pt wanted them off

## 2015-07-07 NOTE — Op Note (Signed)
Colonoscopy to terminal ileum showed shortend colon with several areas of narrowing, atrophied vasculature, areas of inflammation/ulceration and friable mucosa throughout c/w with chronic and acute colitis. TI is normal. Multiple biopsies taken. No further bleeding. Full liquid diet ordered. If tolerated, place on low residue diet. Ordered prednisone 40mg  daily. Taper off by 10mg  weekly until finished. Continue asacol. If no further bleeding, can be discharged to home by tomorrow with f/u in our office in few weeks. Thanks.

## 2015-07-07 NOTE — Plan of Care (Signed)
Problem: Discharge Progression Outcomes Goal: Other Discharge Outcomes/Goals Outcome: Progressing Plan of Care Progress to Goal:   Pt has been drinking bowel prep during shift. Pt report she cannot drink anymore. Tap water enema was given this am. Pt bowels is still not clear. Pt denies pain. No other signs of distress noted. Will continue to monitor.

## 2015-07-08 DIAGNOSIS — K922 Gastrointestinal hemorrhage, unspecified: Secondary | ICD-10-CM | POA: Diagnosis not present

## 2015-07-08 DIAGNOSIS — D649 Anemia, unspecified: Secondary | ICD-10-CM | POA: Diagnosis not present

## 2015-07-08 DIAGNOSIS — K529 Noninfective gastroenteritis and colitis, unspecified: Secondary | ICD-10-CM | POA: Diagnosis not present

## 2015-07-08 DIAGNOSIS — Z716 Tobacco abuse counseling: Secondary | ICD-10-CM | POA: Diagnosis not present

## 2015-07-08 LAB — SURGICAL PATHOLOGY

## 2015-07-08 LAB — HEMOGLOBIN: Hemoglobin: 9.2 g/dL — ABNORMAL LOW (ref 12.0–16.0)

## 2015-07-08 MED ORDER — PANTOPRAZOLE SODIUM 40 MG PO TBEC: 40.0000 mg | DELAYED_RELEASE_TABLET | Freq: Every day | ORAL | Status: DC

## 2015-07-08 MED ORDER — FERROUS SULFATE 325 (65 FE) MG PO TABS: 325.0000 mg | ORAL_TABLET | Freq: Two times a day (BID) | ORAL | Status: AC

## 2015-07-08 MED ORDER — PREDNISONE 10 MG PO TABS: 40.0000 mg | ORAL_TABLET | Freq: Every day | ORAL | Status: DC

## 2015-07-08 NOTE — Discharge Instructions (Addendum)
°  DIET:  Regular diet  DISCHARGE CONDITION:  Stable  ACTIVITY:  Activity as tolerated  OXYGEN:  Home Oxygen: No.   Oxygen Delivery: room air  DISCHARGE LOCATION:  home   If you experience worsening of your admission symptoms, develop shortness of breath, life threatening emergency, suicidal or homicidal thoughts you must seek medical attention immediately by calling 911 or calling your MD immediately  if symptoms less severe.  You Must read complete instructions/literature along with all the possible adverse reactions/side effects for all the Medicines you take and that have been prescribed to you. Take any new Medicines after you have completely understood and accpet all the possible adverse reactions/side effects.   Please note  You were cared for by a hospitalist during your hospital stay. If you have any questions about your discharge medications or the care you received while you were in the hospital after you are discharged, you can call the unit and asked to speak with the hospitalist on call if the hospitalist that took care of you is not available. Once you are discharged, your primary care physician will handle any further medical issues. Please note that NO REFILLS for any discharge medications will be authorized once you are discharged, as it is imperative that you return to your primary care physician (or establish a relationship with a primary care physician if you do not have one) for your aftercare needs so that they can reassess your need for medications and monitor your lab values.  You can resume taking aspirin after 07/14/2015.

## 2015-07-08 NOTE — Progress Notes (Signed)
MD making rounds. Discharge orders received. Patient states "My son Daiva Nakayama is at work right now." She explained to Nurse that son is a Dealer and she is in no rush to be discharged because her son will have to leave work to drive her home. I explained to patient that would not be a problem because the Unit is not full and has available beds at that time. Patient agrees to communicate to RN when son is available for transport. Son arrived to transport patient for discharge. At this time, son explained to RN that he was retired and not at work. RN based information on patient's information, who was alert and oriented during morning assessment. RN addressed information with patient and patient stated, "he is a Dealer and he does work, it is more of playtime, instead of a job. RN apologized for not being aware of this information. Son was Understanding. Patient apologized and blamed herself for the miscommunication. IV removed. Prescriptions E-Prescribed to [facility]. Discharge paperwork provided, explained, signed and witnessed. No unanswered questions. Escorted via wheelchair by RN. All belongings sent with patient and son.

## 2015-07-08 NOTE — Discharge Summary (Signed)
[facility] NAME: Lindsey Hobbs    MR#:  [id number]  DATE OF BIRTH:  08-13-1927  DATE OF ADMISSION:  07/05/2015 ADMITTING PHYSICIAN: Vaughan Basta, MD  DATE OF DISCHARGE: No discharge date for patient encounter.  PRIMARY CARE PHYSICIAN: MASOUD,JAVED, MD    ADMISSION DIAGNOSIS:  Acute lower GI bleeding [K92.2]  DISCHARGE DIAGNOSIS:  Principal Problem:   GI bleed   SECONDARY DIAGNOSIS:   Past Medical History  Diagnosis Date  . Ulcerative colitis   . Hypertension   . COPD (chronic obstructive pulmonary disease)   . Anemia   . Hyperlipidemia   . Guillain Barr syndrome   . Avascular necrosis of bone of hip      ADMITTING HISTORY  HISTORY OF PRESENT ILLNESS: Lindsey Hobbs is a 81 y.o. female with a known history of Crohn's disease had episode of rectal bleed 2 years ago, came to emergency room after having episode of the bright red blood per rectum since last night she had trouble 3 episodes still now. She denies any nausea vomiting diarrhea or constipation, abdominal pain. Her hemoglobin is stable in emergency room she takes aspirin 81 mg at home but denies using any pain medications, last colonoscopy was a few years ago.   HOSPITAL COURSE:   * GI bleed, lower GI secondary to ulcerative colitis Colonoscopy  showed multiple areas of ulcerations with acute and chronic colitis areas. No acute bleeding found. Start prednisone 40 mg daily and taper by 10 mg weekly.  *Acute blood loss anemia over anemia of chronic disease Hemoglobin slowly trended down but is stable over the last 24 hours. Patient has no further bloody stools. Started on iron replacement prior to discharge.  * Smoking Counseled to quit on admission.  Stable for discharge home to follow-up with her primary care physician and GI.   CONSULTS OBTAINED:  Treatment Team:  Hulen Luster, MD  DRUG ALLERGIES:  No Known Allergies  DISCHARGE  MEDICATIONS:   Current Discharge Medication List    START taking these medications   Details  !! ferrous sulfate 325 (65 FE) MG tablet Take 1 tablet (325 mg total) by mouth 2 (two) times daily with a meal. Qty: 180 tablet, Refills: 0    pantoprazole (PROTONIX) 40 MG tablet Take 1 tablet (40 mg total) by mouth daily. Qty: 30 tablet, Refills: 0    predniSONE (DELTASONE) 10 MG tablet Take 4 tablets (40 mg total) by mouth daily with breakfast. Qty: 100 tablet, Refills: 0     !! - Potential duplicate medications found. Please discuss with provider.    CONTINUE these medications which have NOT CHANGED   Details  aspirin EC 81 MG tablet Take 81 mg by mouth at bedtime.    Calcium Carbonate-Vitamin D (CALCIUM 600+D) 600-400 MG-UNIT per tablet Take 1 tablet by mouth 2 (two) times daily.    !! ferrous sulfate 325 (65 FE) MG tablet Take 325 mg by mouth daily.    fexofenadine (ALLEGRA) 180 MG tablet Take 180 mg by mouth at bedtime.    folic acid (FOLVITE) 1 MG tablet Take 1 mg by mouth daily.    gabapentin (NEURONTIN) 100 MG capsule Take 200 mg by mouth at bedtime.    Mesalamine (ASACOL HD) 800 MG TBEC Take 1,600 mg by mouth 3 (three) times daily.    Multiple Vitamin (MULTIVITAMIN WITH MINERALS) TABS tablet Take 1 tablet by mouth daily.    Omega-3 Fatty Acids (FISH OIL  PO) Take 1 capsule by mouth 3 (three) times daily.    zolpidem (AMBIEN) 10 MG tablet Take 10 mg by mouth at bedtime as needed for sleep.     !! - Potential duplicate medications found. Please discuss with provider.       Today    VITAL SIGNS:  Blood pressure 133/51, pulse 75, temperature 98.3 F (36.8 C), temperature source Oral, resp. rate 18, height 5\' 4"  (1.626 m), weight 59.421 kg (131 lb), SpO2 89 %.  I/O:   Intake/Output Summary (Last 24 hours) at 07/08/15 1250 Last data filed at 07/08/15 0800  Gross per 24 hour  Intake    240 ml  Output      0 ml  Net    240 ml    PHYSICAL EXAMINATION:  Physical  Exam  GENERAL:  81 y.o.-year-old patient lying in the bed with no acute distress.  LUNGS: Normal breath sounds bilaterally, no wheezing, rales,rhonchi or crepitation. No use of accessory muscles of respiration.  CARDIOVASCULAR: S1, S2 normal. No murmurs, rubs, or gallops.  ABDOMEN: Soft, non-tender, non-distended. Bowel sounds present. No organomegaly or mass.  NEUROLOGIC: Moves all 4 extremities. PSYCHIATRIC: The patient is alert and oriented x 3.  SKIN: No obvious rash, lesion, or ulcer.   DATA REVIEW:   CBC  Recent Labs Lab 07/06/15 0947  07/08/15 0438  WBC 5.9  --   --   HGB 9.9*  < > 9.2*  HCT 30.8*  --   --   PLT 335  --   --   < > = values in this interval not displayed.  Chemistries   Recent Labs Lab 07/05/15 1321 07/06/15 0947  NA 145 146*  K 4.2 3.8  CL 103 108  CO2 33* 31  GLUCOSE 111* 100*  BUN 32* 21*  CREATININE 1.09* 0.91  CALCIUM 10.4* 9.3  AST 16  --   ALT 8*  --   ALKPHOS 55  --   BILITOT 0.4  --     Cardiac Enzymes No results for input(s): TROPONINI in the last 168 hours.  Microbiology Results  No results found for this or any previous visit.  RADIOLOGY:  No results found.    Follow up with PCP in 1 week.  Management plans discussed with the patient, family and they are in agreement.  CODE STATUS:     Code Status Orders        Start     Ordered   07/05/15 1733  Do not attempt resuscitation (DNR)   Continuous    Question Answer Comment  In the event of cardiac or respiratory ARREST Do not call a "code blue"   In the event of cardiac or respiratory ARREST Do not perform Intubation, CPR, defibrillation or ACLS   In the event of cardiac or respiratory ARREST Use medication by any route, position, wound care, and other measures to relive pain and suffering. May use oxygen, suction and manual treatment of airway obstruction as needed for comfort.      07/05/15 1733    Advance Directive Documentation        Most Recent Value    Type of Advance Directive  Healthcare Power of Attorney, Living will   Pre-existing out of facility DNR order (yellow form or pink MOST form)     "MOST" Form in Place?        TOTAL TIME TAKING CARE OF THIS PATIENT ON DAY OF DISCHARGE: more than 30 minutes.  Hillary Bow R M.D on 07/08/2015 at 12:50 PM  Between 7am to 6pm - Pager - [phone]  After 6pm go to [email or web address] - password [email or web address] [facility]  Office  [phone]  CC: Primary care physician; Cletis Athens, MD

## 2015-07-08 NOTE — Plan of Care (Addendum)
Problem: Discharge Progression Outcomes Goal: Other Discharge Outcomes/Goals Outcome: Progressing Plan of Care Progress to Goal:   Pt has tolerated full liquid diet and is now advanced to low residue diet per Dr. Candace Cruise notes. Pt denies pain and rested comfortable during shift. Pt hemoglobin has improved to 9.2. No other signs of distress noted. Will continue to monitor.

## 2015-07-08 NOTE — Care Management (Signed)
Admitted to [facility] with the diagnosis of GI Bleed. Lives alone. Son is Roxy Manns Investment banker, corporate) [phone]). HomeHealth Nursing Assistant thru [facility] for awhile. She comes every Wednesday 10:00am-1PM.  Nurse comes from [facility] about every 3 months.States she takes care of all activities of daily living both basic and instrumental herself, still drives. Last seen Dr. Lavera Guise last Monday. Uses a cane to aid in ambulation. No falls. Good appetite. Son will transport.  Shelbie Ammons RN MSN Care Management [phone]

## 2015-07-21 DIAGNOSIS — K51011 Ulcerative (chronic) pancolitis with rectal bleeding: Secondary | ICD-10-CM | POA: Diagnosis not present

## 2015-07-21 DIAGNOSIS — D508 Other iron deficiency anemias: Secondary | ICD-10-CM | POA: Diagnosis not present

## 2015-09-06 DIAGNOSIS — Z85828 Personal history of other malignant neoplasm of skin: Secondary | ICD-10-CM | POA: Diagnosis not present

## 2015-09-06 DIAGNOSIS — C44329 Squamous cell carcinoma of skin of other parts of face: Secondary | ICD-10-CM | POA: Diagnosis not present

## 2015-09-06 DIAGNOSIS — D485 Neoplasm of uncertain behavior of skin: Secondary | ICD-10-CM | POA: Diagnosis not present

## 2015-09-06 DIAGNOSIS — Z720 Tobacco use: Secondary | ICD-10-CM | POA: Diagnosis not present

## 2015-09-06 DIAGNOSIS — C44212 Basal cell carcinoma of skin of right ear and external auricular canal: Secondary | ICD-10-CM | POA: Diagnosis not present

## 2015-09-06 DIAGNOSIS — D0439 Carcinoma in situ of skin of other parts of face: Secondary | ICD-10-CM | POA: Diagnosis not present

## 2015-09-06 DIAGNOSIS — C44319 Basal cell carcinoma of skin of other parts of face: Secondary | ICD-10-CM | POA: Diagnosis not present

## 2015-09-06 DIAGNOSIS — C44311 Basal cell carcinoma of skin of nose: Secondary | ICD-10-CM | POA: Diagnosis not present

## 2015-09-19 DIAGNOSIS — N182 Chronic kidney disease, stage 2 (mild): Secondary | ICD-10-CM | POA: Diagnosis not present

## 2015-09-19 DIAGNOSIS — E784 Other hyperlipidemia: Secondary | ICD-10-CM | POA: Diagnosis not present

## 2015-09-19 DIAGNOSIS — G518 Other disorders of facial nerve: Secondary | ICD-10-CM | POA: Diagnosis not present

## 2015-09-19 DIAGNOSIS — R6884 Jaw pain: Secondary | ICD-10-CM | POA: Diagnosis not present

## 2015-09-22 DIAGNOSIS — R6884 Jaw pain: Secondary | ICD-10-CM | POA: Diagnosis not present

## 2015-09-22 DIAGNOSIS — R609 Edema, unspecified: Secondary | ICD-10-CM | POA: Diagnosis not present

## 2015-09-22 DIAGNOSIS — G518 Other disorders of facial nerve: Secondary | ICD-10-CM | POA: Diagnosis not present

## 2015-09-22 DIAGNOSIS — M159 Polyosteoarthritis, unspecified: Secondary | ICD-10-CM | POA: Diagnosis not present

## 2015-10-06 DIAGNOSIS — C44310 Basal cell carcinoma of skin of unspecified parts of face: Secondary | ICD-10-CM | POA: Diagnosis not present

## 2015-10-06 DIAGNOSIS — C44112 Basal cell carcinoma of skin of right eyelid, including canthus: Secondary | ICD-10-CM | POA: Diagnosis not present

## 2015-10-06 DIAGNOSIS — Z85828 Personal history of other malignant neoplasm of skin: Secondary | ICD-10-CM | POA: Diagnosis not present

## 2015-11-29 DIAGNOSIS — C44112 Basal cell carcinoma of skin of right eyelid, including canthus: Secondary | ICD-10-CM | POA: Diagnosis not present

## 2015-11-29 DIAGNOSIS — C44319 Basal cell carcinoma of skin of other parts of face: Secondary | ICD-10-CM | POA: Diagnosis not present

## 2015-12-15 DIAGNOSIS — Z85828 Personal history of other malignant neoplasm of skin: Secondary | ICD-10-CM | POA: Diagnosis not present

## 2015-12-15 DIAGNOSIS — C44311 Basal cell carcinoma of skin of nose: Secondary | ICD-10-CM | POA: Diagnosis not present

## 2015-12-15 DIAGNOSIS — C44319 Basal cell carcinoma of skin of other parts of face: Secondary | ICD-10-CM | POA: Diagnosis not present

## 2016-01-02 DIAGNOSIS — M549 Dorsalgia, unspecified: Secondary | ICD-10-CM | POA: Diagnosis not present

## 2016-01-02 DIAGNOSIS — I739 Peripheral vascular disease, unspecified: Secondary | ICD-10-CM | POA: Diagnosis not present

## 2016-01-02 DIAGNOSIS — I10 Essential (primary) hypertension: Secondary | ICD-10-CM | POA: Diagnosis not present

## 2016-01-02 DIAGNOSIS — G518 Other disorders of facial nerve: Secondary | ICD-10-CM | POA: Diagnosis not present

## 2016-01-09 DIAGNOSIS — M7989 Other specified soft tissue disorders: Secondary | ICD-10-CM | POA: Diagnosis not present

## 2016-01-09 DIAGNOSIS — I10 Essential (primary) hypertension: Secondary | ICD-10-CM | POA: Diagnosis not present

## 2016-01-09 DIAGNOSIS — F17200 Nicotine dependence, unspecified, uncomplicated: Secondary | ICD-10-CM | POA: Diagnosis not present

## 2016-01-09 DIAGNOSIS — M79609 Pain in unspecified limb: Secondary | ICD-10-CM | POA: Diagnosis not present

## 2016-01-09 DIAGNOSIS — E785 Hyperlipidemia, unspecified: Secondary | ICD-10-CM | POA: Diagnosis not present

## 2016-01-09 DIAGNOSIS — I872 Venous insufficiency (chronic) (peripheral): Secondary | ICD-10-CM | POA: Diagnosis not present

## 2016-01-09 DIAGNOSIS — I70222 Atherosclerosis of native arteries of extremities with rest pain, left leg: Secondary | ICD-10-CM | POA: Diagnosis not present

## 2016-01-30 DIAGNOSIS — I739 Peripheral vascular disease, unspecified: Secondary | ICD-10-CM | POA: Diagnosis not present

## 2016-01-30 DIAGNOSIS — M549 Dorsalgia, unspecified: Secondary | ICD-10-CM | POA: Diagnosis not present

## 2016-01-30 DIAGNOSIS — G518 Other disorders of facial nerve: Secondary | ICD-10-CM | POA: Diagnosis not present

## 2016-02-02 DIAGNOSIS — M7989 Other specified soft tissue disorders: Secondary | ICD-10-CM | POA: Diagnosis not present

## 2016-02-02 DIAGNOSIS — I10 Essential (primary) hypertension: Secondary | ICD-10-CM | POA: Diagnosis not present

## 2016-02-02 DIAGNOSIS — M79609 Pain in unspecified limb: Secondary | ICD-10-CM | POA: Diagnosis not present

## 2016-02-02 DIAGNOSIS — F17200 Nicotine dependence, unspecified, uncomplicated: Secondary | ICD-10-CM | POA: Diagnosis not present

## 2016-02-02 DIAGNOSIS — E785 Hyperlipidemia, unspecified: Secondary | ICD-10-CM | POA: Diagnosis not present

## 2016-02-02 DIAGNOSIS — I700 Atherosclerosis of aorta: Secondary | ICD-10-CM | POA: Diagnosis not present

## 2016-02-02 DIAGNOSIS — I70222 Atherosclerosis of native arteries of extremities with rest pain, left leg: Secondary | ICD-10-CM | POA: Diagnosis not present

## 2016-02-02 DIAGNOSIS — I872 Venous insufficiency (chronic) (peripheral): Secondary | ICD-10-CM | POA: Diagnosis not present

## 2016-03-08 DIAGNOSIS — I10 Essential (primary) hypertension: Secondary | ICD-10-CM | POA: Diagnosis not present

## 2016-03-08 DIAGNOSIS — M1990 Unspecified osteoarthritis, unspecified site: Secondary | ICD-10-CM | POA: Diagnosis not present

## 2016-03-08 DIAGNOSIS — I872 Venous insufficiency (chronic) (peripheral): Secondary | ICD-10-CM | POA: Diagnosis not present

## 2016-03-08 DIAGNOSIS — I70223 Atherosclerosis of native arteries of extremities with rest pain, bilateral legs: Secondary | ICD-10-CM | POA: Diagnosis not present

## 2016-03-08 DIAGNOSIS — M79609 Pain in unspecified limb: Secondary | ICD-10-CM | POA: Diagnosis not present

## 2016-03-08 DIAGNOSIS — I70213 Atherosclerosis of native arteries of extremities with intermittent claudication, bilateral legs: Secondary | ICD-10-CM | POA: Diagnosis not present

## 2016-03-08 DIAGNOSIS — E785 Hyperlipidemia, unspecified: Secondary | ICD-10-CM | POA: Diagnosis not present

## 2016-03-08 DIAGNOSIS — I700 Atherosclerosis of aorta: Secondary | ICD-10-CM | POA: Diagnosis not present

## 2016-03-08 DIAGNOSIS — F17200 Nicotine dependence, unspecified, uncomplicated: Secondary | ICD-10-CM | POA: Diagnosis not present

## 2016-03-08 DIAGNOSIS — M7989 Other specified soft tissue disorders: Secondary | ICD-10-CM | POA: Diagnosis not present

## 2016-03-27 DIAGNOSIS — Z85828 Personal history of other malignant neoplasm of skin: Secondary | ICD-10-CM | POA: Diagnosis not present

## 2016-03-27 DIAGNOSIS — Z08 Encounter for follow-up examination after completed treatment for malignant neoplasm: Secondary | ICD-10-CM | POA: Diagnosis not present

## 2016-03-27 DIAGNOSIS — C44319 Basal cell carcinoma of skin of other parts of face: Secondary | ICD-10-CM | POA: Diagnosis not present

## 2016-03-27 DIAGNOSIS — C44311 Basal cell carcinoma of skin of nose: Secondary | ICD-10-CM | POA: Diagnosis not present

## 2016-04-30 DIAGNOSIS — J983 Compensatory emphysema: Secondary | ICD-10-CM | POA: Diagnosis not present

## 2016-04-30 DIAGNOSIS — I10 Essential (primary) hypertension: Secondary | ICD-10-CM | POA: Diagnosis not present

## 2016-04-30 DIAGNOSIS — N182 Chronic kidney disease, stage 2 (mild): Secondary | ICD-10-CM | POA: Diagnosis not present

## 2016-04-30 DIAGNOSIS — G44091 Other trigeminal autonomic cephalgias (TAC), intractable: Secondary | ICD-10-CM | POA: Diagnosis not present

## 2016-07-16 DIAGNOSIS — C44212 Basal cell carcinoma of skin of right ear and external auricular canal: Secondary | ICD-10-CM | POA: Diagnosis not present

## 2016-07-16 DIAGNOSIS — Z85828 Personal history of other malignant neoplasm of skin: Secondary | ICD-10-CM | POA: Diagnosis not present

## 2016-07-16 DIAGNOSIS — C44311 Basal cell carcinoma of skin of nose: Secondary | ICD-10-CM | POA: Diagnosis not present

## 2016-07-16 DIAGNOSIS — C44319 Basal cell carcinoma of skin of other parts of face: Secondary | ICD-10-CM | POA: Diagnosis not present

## 2016-07-16 DIAGNOSIS — Z08 Encounter for follow-up examination after completed treatment for malignant neoplasm: Secondary | ICD-10-CM | POA: Diagnosis not present

## 2016-08-30 DIAGNOSIS — I739 Peripheral vascular disease, unspecified: Secondary | ICD-10-CM | POA: Diagnosis not present

## 2016-08-30 DIAGNOSIS — N182 Chronic kidney disease, stage 2 (mild): Secondary | ICD-10-CM | POA: Diagnosis not present

## 2016-08-30 DIAGNOSIS — J983 Compensatory emphysema: Secondary | ICD-10-CM | POA: Diagnosis not present

## 2016-08-30 DIAGNOSIS — G44091 Other trigeminal autonomic cephalgias (TAC), intractable: Secondary | ICD-10-CM | POA: Diagnosis not present

## 2016-09-10 ENCOUNTER — Encounter (INDEPENDENT_AMBULATORY_CARE_PROVIDER_SITE_OTHER): Payer: Self-pay

## 2016-09-10 ENCOUNTER — Ambulatory Visit (INDEPENDENT_AMBULATORY_CARE_PROVIDER_SITE_OTHER): Payer: Self-pay | Admitting: Vascular Surgery

## 2016-11-06 DIAGNOSIS — Z85828 Personal history of other malignant neoplasm of skin: Secondary | ICD-10-CM | POA: Diagnosis not present

## 2016-11-06 DIAGNOSIS — C44311 Basal cell carcinoma of skin of nose: Secondary | ICD-10-CM | POA: Diagnosis not present

## 2016-11-06 DIAGNOSIS — Z08 Encounter for follow-up examination after completed treatment for malignant neoplasm: Secondary | ICD-10-CM | POA: Diagnosis not present

## 2016-11-06 DIAGNOSIS — D485 Neoplasm of uncertain behavior of skin: Secondary | ICD-10-CM | POA: Diagnosis not present

## 2016-11-27 DIAGNOSIS — I739 Peripheral vascular disease, unspecified: Secondary | ICD-10-CM | POA: Diagnosis not present

## 2016-11-27 DIAGNOSIS — N182 Chronic kidney disease, stage 2 (mild): Secondary | ICD-10-CM | POA: Diagnosis not present

## 2016-11-27 DIAGNOSIS — J983 Compensatory emphysema: Secondary | ICD-10-CM | POA: Diagnosis not present

## 2016-11-27 DIAGNOSIS — G44091 Other trigeminal autonomic cephalgias (TAC), intractable: Secondary | ICD-10-CM | POA: Diagnosis not present

## 2017-02-05 DIAGNOSIS — C44319 Basal cell carcinoma of skin of other parts of face: Secondary | ICD-10-CM | POA: Diagnosis not present

## 2017-02-05 DIAGNOSIS — C44311 Basal cell carcinoma of skin of nose: Secondary | ICD-10-CM | POA: Diagnosis not present

## 2017-02-05 DIAGNOSIS — C44722 Squamous cell carcinoma of skin of right lower limb, including hip: Secondary | ICD-10-CM | POA: Diagnosis not present

## 2017-02-05 DIAGNOSIS — D485 Neoplasm of uncertain behavior of skin: Secondary | ICD-10-CM | POA: Diagnosis not present

## 2017-02-05 DIAGNOSIS — Z85828 Personal history of other malignant neoplasm of skin: Secondary | ICD-10-CM | POA: Diagnosis not present

## 2017-02-05 DIAGNOSIS — Z08 Encounter for follow-up examination after completed treatment for malignant neoplasm: Secondary | ICD-10-CM | POA: Diagnosis not present

## 2017-03-04 DIAGNOSIS — I739 Peripheral vascular disease, unspecified: Secondary | ICD-10-CM | POA: Diagnosis not present

## 2017-03-04 DIAGNOSIS — L03116 Cellulitis of left lower limb: Secondary | ICD-10-CM | POA: Diagnosis not present

## 2017-03-11 DIAGNOSIS — J449 Chronic obstructive pulmonary disease, unspecified: Secondary | ICD-10-CM | POA: Diagnosis not present

## 2017-03-11 DIAGNOSIS — L03116 Cellulitis of left lower limb: Secondary | ICD-10-CM | POA: Diagnosis not present

## 2017-03-11 DIAGNOSIS — I10 Essential (primary) hypertension: Secondary | ICD-10-CM | POA: Diagnosis not present

## 2017-03-11 DIAGNOSIS — J983 Compensatory emphysema: Secondary | ICD-10-CM | POA: Diagnosis not present

## 2017-03-18 DIAGNOSIS — C44629 Squamous cell carcinoma of skin of left upper limb, including shoulder: Secondary | ICD-10-CM | POA: Diagnosis not present

## 2017-03-18 DIAGNOSIS — L905 Scar conditions and fibrosis of skin: Secondary | ICD-10-CM | POA: Diagnosis not present

## 2017-03-18 DIAGNOSIS — D485 Neoplasm of uncertain behavior of skin: Secondary | ICD-10-CM | POA: Diagnosis not present

## 2017-03-18 DIAGNOSIS — C44722 Squamous cell carcinoma of skin of right lower limb, including hip: Secondary | ICD-10-CM | POA: Diagnosis not present

## 2017-03-26 ENCOUNTER — Ambulatory Visit: Payer: Medicare Other

## 2017-03-26 ENCOUNTER — Telehealth: Payer: Self-pay | Admitting: *Deleted

## 2017-03-26 ENCOUNTER — Ambulatory Visit (INDEPENDENT_AMBULATORY_CARE_PROVIDER_SITE_OTHER): Payer: Medicare Other | Admitting: Podiatry

## 2017-03-26 DIAGNOSIS — I83025 Varicose veins of left lower extremity with ulcer other part of foot: Secondary | ICD-10-CM

## 2017-03-26 DIAGNOSIS — L97529 Non-pressure chronic ulcer of other part of left foot with unspecified severity: Secondary | ICD-10-CM

## 2017-03-26 DIAGNOSIS — M21612 Bunion of left foot: Secondary | ICD-10-CM

## 2017-03-26 DIAGNOSIS — L97522 Non-pressure chronic ulcer of other part of left foot with fat layer exposed: Secondary | ICD-10-CM

## 2017-03-26 NOTE — Telephone Encounter (Addendum)
-----   Message from Edrick Kins, DPM sent at 03/26/2017 11:49 AM EDT ----- Regarding: Home Health Nurse dressing changes Please order dressing changes 3x/week x 6 weeks to left foot ulcer.  - cleanse with NS. Dry.  - Apply moistened Prisma or Puracol (collagen dressing)  - dress with dry sterile dressing  Dx: ulcer left foot secondary to venous insufficiency  Note dictated. Thanks, Dr. Amalia Hailey. I spoke with pt and she states only has [facility] come by once a month. I told her Dr. Amalia Hailey had ordered home Health care nurse to come by 3 x week for 6 weeks to dress her wound. Pt states understanding. Orders faxed to [facility].

## 2017-03-26 NOTE — Progress Notes (Signed)
   Subjective:  Patient presents today for evaluation of ulceration(s) to the left lower extremitie(s). Patient has experienced the ulceration for greater than 1 week. Patient relates pain to the ulceration site. Patient has not been caring for the wound(s) at home. Patient also complains of a bunion deformity underlying the ulceration site. Patient presents today with her daughter-in-law.    Objective/Physical Exam General: The patient is alert and oriented x3 in no acute distress.  Dermatology:  Wound #1 noted to the dorsal medial aspect of the first MPJ left foot measuring approximately [phone] cm (LxWxD).   To the noted ulceration(s), there is no eschar. There is a moderate amount of slough, fibrin, and necrotic tissue noted. Granulation tissue and wound base is red. There is a minimal amount of serosanguineous drainage noted. There is no exposed bone muscle-tendon ligament or joint. There is no malodor. Periwound integrity is intact. Skin is warm, dry and supple bilateral lower extremities.  Vascular: Palpable pedal pulses bilaterally. Mild edema noted. Capillary refill within normal limits. Varicosities noted bilateral lower extremities.   Neurological: Epicritic and protective threshold intact bilaterally.   Musculoskeletal Exam: Range of motion within normal limits to all pedal and ankle joints bilateral. Muscle strength 5/5 in all groups bilateral. Clinical evidence of bunion deformity noted bilateral lower extremities.  Radiographic exam: Increased intermetatarsal angle with the hallux abductus angle greater than 30 noted. Diffuse osteoporosis noted throughout the foot with arthritic changes.  Assessment: #1 left foot ulcer secondary to venous insufficiency #2 varicosities bilateral lower extremities  Plan of Care:  #1 Patient was evaluated. #2 medically necessary excisional debridement including subcutaneous tissue was performed using a tissue nipper and a chisel blade.  Excisional debridement of all the necrotic nonviable tissue down to healthy bleeding viable tissue was performed with post-debridement measurements same as pre-. #3 the wound was cleansed with normal saline. #4 collagen dressing applied directly to wound followed by dry sterile dressings. #4 orders for home health care dressing changes were provided today.  #5 patient is to return to clinic in 3 weeks.   Edrick Kins, DPM [facility]  Dr. Edrick Kins, [address]                                        [place], [place] [address]                Office [phone]  Fax [phone]

## 2017-03-29 DIAGNOSIS — I10 Essential (primary) hypertension: Secondary | ICD-10-CM | POA: Diagnosis not present

## 2017-03-29 DIAGNOSIS — L97522 Non-pressure chronic ulcer of other part of left foot with fat layer exposed: Secondary | ICD-10-CM | POA: Diagnosis not present

## 2017-03-29 DIAGNOSIS — L97529 Non-pressure chronic ulcer of other part of left foot with unspecified severity: Secondary | ICD-10-CM | POA: Diagnosis not present

## 2017-03-29 DIAGNOSIS — I83025 Varicose veins of left lower extremity with ulcer other part of foot: Secondary | ICD-10-CM | POA: Diagnosis not present

## 2017-03-29 DIAGNOSIS — I739 Peripheral vascular disease, unspecified: Secondary | ICD-10-CM | POA: Diagnosis not present

## 2017-03-29 DIAGNOSIS — M21612 Bunion of left foot: Secondary | ICD-10-CM | POA: Diagnosis not present

## 2017-04-01 DIAGNOSIS — I83025 Varicose veins of left lower extremity with ulcer other part of foot: Secondary | ICD-10-CM | POA: Diagnosis not present

## 2017-04-01 DIAGNOSIS — L97522 Non-pressure chronic ulcer of other part of left foot with fat layer exposed: Secondary | ICD-10-CM | POA: Diagnosis not present

## 2017-04-01 DIAGNOSIS — L97529 Non-pressure chronic ulcer of other part of left foot with unspecified severity: Secondary | ICD-10-CM | POA: Diagnosis not present

## 2017-04-01 DIAGNOSIS — M21612 Bunion of left foot: Secondary | ICD-10-CM | POA: Diagnosis not present

## 2017-04-01 DIAGNOSIS — I739 Peripheral vascular disease, unspecified: Secondary | ICD-10-CM | POA: Diagnosis not present

## 2017-04-01 DIAGNOSIS — I10 Essential (primary) hypertension: Secondary | ICD-10-CM | POA: Diagnosis not present

## 2017-04-03 DIAGNOSIS — I739 Peripheral vascular disease, unspecified: Secondary | ICD-10-CM | POA: Diagnosis not present

## 2017-04-03 DIAGNOSIS — M21612 Bunion of left foot: Secondary | ICD-10-CM | POA: Diagnosis not present

## 2017-04-03 DIAGNOSIS — I83025 Varicose veins of left lower extremity with ulcer other part of foot: Secondary | ICD-10-CM | POA: Diagnosis not present

## 2017-04-03 DIAGNOSIS — I10 Essential (primary) hypertension: Secondary | ICD-10-CM | POA: Diagnosis not present

## 2017-04-03 DIAGNOSIS — L97529 Non-pressure chronic ulcer of other part of left foot with unspecified severity: Secondary | ICD-10-CM | POA: Diagnosis not present

## 2017-04-03 DIAGNOSIS — L97522 Non-pressure chronic ulcer of other part of left foot with fat layer exposed: Secondary | ICD-10-CM | POA: Diagnosis not present

## 2017-04-04 DIAGNOSIS — C44629 Squamous cell carcinoma of skin of left upper limb, including shoulder: Secondary | ICD-10-CM | POA: Diagnosis not present

## 2017-04-05 DIAGNOSIS — I10 Essential (primary) hypertension: Secondary | ICD-10-CM | POA: Diagnosis not present

## 2017-04-05 DIAGNOSIS — I739 Peripheral vascular disease, unspecified: Secondary | ICD-10-CM | POA: Diagnosis not present

## 2017-04-05 DIAGNOSIS — L97529 Non-pressure chronic ulcer of other part of left foot with unspecified severity: Secondary | ICD-10-CM | POA: Diagnosis not present

## 2017-04-05 DIAGNOSIS — L97522 Non-pressure chronic ulcer of other part of left foot with fat layer exposed: Secondary | ICD-10-CM | POA: Diagnosis not present

## 2017-04-05 DIAGNOSIS — I83025 Varicose veins of left lower extremity with ulcer other part of foot: Secondary | ICD-10-CM | POA: Diagnosis not present

## 2017-04-05 DIAGNOSIS — M21612 Bunion of left foot: Secondary | ICD-10-CM | POA: Diagnosis not present

## 2017-04-08 DIAGNOSIS — I739 Peripheral vascular disease, unspecified: Secondary | ICD-10-CM | POA: Diagnosis not present

## 2017-04-08 DIAGNOSIS — L97522 Non-pressure chronic ulcer of other part of left foot with fat layer exposed: Secondary | ICD-10-CM | POA: Diagnosis not present

## 2017-04-08 DIAGNOSIS — M21612 Bunion of left foot: Secondary | ICD-10-CM | POA: Diagnosis not present

## 2017-04-08 DIAGNOSIS — L97529 Non-pressure chronic ulcer of other part of left foot with unspecified severity: Secondary | ICD-10-CM | POA: Diagnosis not present

## 2017-04-08 DIAGNOSIS — I83025 Varicose veins of left lower extremity with ulcer other part of foot: Secondary | ICD-10-CM | POA: Diagnosis not present

## 2017-04-08 DIAGNOSIS — I10 Essential (primary) hypertension: Secondary | ICD-10-CM | POA: Diagnosis not present

## 2017-04-10 DIAGNOSIS — I10 Essential (primary) hypertension: Secondary | ICD-10-CM | POA: Diagnosis not present

## 2017-04-10 DIAGNOSIS — M21612 Bunion of left foot: Secondary | ICD-10-CM | POA: Diagnosis not present

## 2017-04-10 DIAGNOSIS — L97529 Non-pressure chronic ulcer of other part of left foot with unspecified severity: Secondary | ICD-10-CM | POA: Diagnosis not present

## 2017-04-10 DIAGNOSIS — L97522 Non-pressure chronic ulcer of other part of left foot with fat layer exposed: Secondary | ICD-10-CM | POA: Diagnosis not present

## 2017-04-10 DIAGNOSIS — I739 Peripheral vascular disease, unspecified: Secondary | ICD-10-CM | POA: Diagnosis not present

## 2017-04-10 DIAGNOSIS — I83025 Varicose veins of left lower extremity with ulcer other part of foot: Secondary | ICD-10-CM | POA: Diagnosis not present

## 2017-04-12 DIAGNOSIS — M21612 Bunion of left foot: Secondary | ICD-10-CM | POA: Diagnosis not present

## 2017-04-12 DIAGNOSIS — I10 Essential (primary) hypertension: Secondary | ICD-10-CM | POA: Diagnosis not present

## 2017-04-12 DIAGNOSIS — I739 Peripheral vascular disease, unspecified: Secondary | ICD-10-CM | POA: Diagnosis not present

## 2017-04-12 DIAGNOSIS — L97529 Non-pressure chronic ulcer of other part of left foot with unspecified severity: Secondary | ICD-10-CM | POA: Diagnosis not present

## 2017-04-12 DIAGNOSIS — L97522 Non-pressure chronic ulcer of other part of left foot with fat layer exposed: Secondary | ICD-10-CM | POA: Diagnosis not present

## 2017-04-12 DIAGNOSIS — I83025 Varicose veins of left lower extremity with ulcer other part of foot: Secondary | ICD-10-CM | POA: Diagnosis not present

## 2017-04-15 DIAGNOSIS — L97529 Non-pressure chronic ulcer of other part of left foot with unspecified severity: Secondary | ICD-10-CM | POA: Diagnosis not present

## 2017-04-15 DIAGNOSIS — I739 Peripheral vascular disease, unspecified: Secondary | ICD-10-CM | POA: Diagnosis not present

## 2017-04-15 DIAGNOSIS — I10 Essential (primary) hypertension: Secondary | ICD-10-CM | POA: Diagnosis not present

## 2017-04-15 DIAGNOSIS — I83025 Varicose veins of left lower extremity with ulcer other part of foot: Secondary | ICD-10-CM | POA: Diagnosis not present

## 2017-04-15 DIAGNOSIS — M21612 Bunion of left foot: Secondary | ICD-10-CM | POA: Diagnosis not present

## 2017-04-15 DIAGNOSIS — L97522 Non-pressure chronic ulcer of other part of left foot with fat layer exposed: Secondary | ICD-10-CM | POA: Diagnosis not present

## 2017-04-16 ENCOUNTER — Encounter: Payer: Self-pay | Admitting: Podiatry

## 2017-04-16 ENCOUNTER — Ambulatory Visit (INDEPENDENT_AMBULATORY_CARE_PROVIDER_SITE_OTHER): Payer: Medicare Other | Admitting: Podiatry

## 2017-04-16 DIAGNOSIS — L97522 Non-pressure chronic ulcer of other part of left foot with fat layer exposed: Secondary | ICD-10-CM

## 2017-04-16 DIAGNOSIS — I83025 Varicose veins of left lower extremity with ulcer other part of foot: Secondary | ICD-10-CM

## 2017-04-16 DIAGNOSIS — L97529 Non-pressure chronic ulcer of other part of left foot with unspecified severity: Secondary | ICD-10-CM

## 2017-04-16 DIAGNOSIS — Q828 Other specified congenital malformations of skin: Secondary | ICD-10-CM

## 2017-04-16 DIAGNOSIS — I739 Peripheral vascular disease, unspecified: Secondary | ICD-10-CM

## 2017-04-18 DIAGNOSIS — L97529 Non-pressure chronic ulcer of other part of left foot with unspecified severity: Secondary | ICD-10-CM | POA: Diagnosis not present

## 2017-04-18 DIAGNOSIS — L97522 Non-pressure chronic ulcer of other part of left foot with fat layer exposed: Secondary | ICD-10-CM | POA: Diagnosis not present

## 2017-04-18 DIAGNOSIS — I83025 Varicose veins of left lower extremity with ulcer other part of foot: Secondary | ICD-10-CM | POA: Diagnosis not present

## 2017-04-18 DIAGNOSIS — I10 Essential (primary) hypertension: Secondary | ICD-10-CM | POA: Diagnosis not present

## 2017-04-18 DIAGNOSIS — I739 Peripheral vascular disease, unspecified: Secondary | ICD-10-CM | POA: Diagnosis not present

## 2017-04-18 DIAGNOSIS — M21612 Bunion of left foot: Secondary | ICD-10-CM | POA: Diagnosis not present

## 2017-04-19 ENCOUNTER — Telehealth: Payer: Self-pay

## 2017-04-19 ENCOUNTER — Other Ambulatory Visit: Payer: Self-pay

## 2017-04-19 DIAGNOSIS — I83025 Varicose veins of left lower extremity with ulcer other part of foot: Secondary | ICD-10-CM | POA: Diagnosis not present

## 2017-04-19 DIAGNOSIS — I10 Essential (primary) hypertension: Secondary | ICD-10-CM | POA: Diagnosis not present

## 2017-04-19 DIAGNOSIS — L97529 Non-pressure chronic ulcer of other part of left foot with unspecified severity: Secondary | ICD-10-CM | POA: Diagnosis not present

## 2017-04-19 DIAGNOSIS — M21612 Bunion of left foot: Secondary | ICD-10-CM | POA: Diagnosis not present

## 2017-04-19 DIAGNOSIS — L97522 Non-pressure chronic ulcer of other part of left foot with fat layer exposed: Secondary | ICD-10-CM

## 2017-04-19 DIAGNOSIS — I739 Peripheral vascular disease, unspecified: Secondary | ICD-10-CM | POA: Diagnosis not present

## 2017-04-19 NOTE — Progress Notes (Signed)
Per Dr. Amalia Hailey, patient is to be referred to Cardiology for vascular workup,  Dx: PVD Bilat, nonhealing wound of left foot.  Appointment has been made with Dr. Harrell Gave End for [place], 04/24/17 at 10:40am.    Attempted to contact patient regarding appt, but no answer.  Will try back later.

## 2017-04-19 NOTE — Telephone Encounter (Signed)
Patient has been notified of her appt with [facility] heart care Dr. Saunders Revel on Wednesday 04/24/17 at 10:40am

## 2017-04-22 DIAGNOSIS — I83025 Varicose veins of left lower extremity with ulcer other part of foot: Secondary | ICD-10-CM | POA: Diagnosis not present

## 2017-04-22 DIAGNOSIS — L97529 Non-pressure chronic ulcer of other part of left foot with unspecified severity: Secondary | ICD-10-CM | POA: Diagnosis not present

## 2017-04-22 DIAGNOSIS — L97522 Non-pressure chronic ulcer of other part of left foot with fat layer exposed: Secondary | ICD-10-CM | POA: Diagnosis not present

## 2017-04-22 DIAGNOSIS — I739 Peripheral vascular disease, unspecified: Secondary | ICD-10-CM | POA: Diagnosis not present

## 2017-04-22 DIAGNOSIS — I10 Essential (primary) hypertension: Secondary | ICD-10-CM | POA: Diagnosis not present

## 2017-04-22 DIAGNOSIS — M21612 Bunion of left foot: Secondary | ICD-10-CM | POA: Diagnosis not present

## 2017-04-23 DIAGNOSIS — G518 Other disorders of facial nerve: Secondary | ICD-10-CM | POA: Diagnosis not present

## 2017-04-23 DIAGNOSIS — I739 Peripheral vascular disease, unspecified: Secondary | ICD-10-CM | POA: Diagnosis not present

## 2017-04-23 DIAGNOSIS — M159 Polyosteoarthritis, unspecified: Secondary | ICD-10-CM | POA: Diagnosis not present

## 2017-04-23 DIAGNOSIS — L309 Dermatitis, unspecified: Secondary | ICD-10-CM | POA: Diagnosis not present

## 2017-04-24 ENCOUNTER — Ambulatory Visit: Payer: Medicare Other | Admitting: Internal Medicine

## 2017-04-24 DIAGNOSIS — L97529 Non-pressure chronic ulcer of other part of left foot with unspecified severity: Secondary | ICD-10-CM | POA: Diagnosis not present

## 2017-04-24 DIAGNOSIS — I10 Essential (primary) hypertension: Secondary | ICD-10-CM | POA: Diagnosis not present

## 2017-04-24 DIAGNOSIS — I739 Peripheral vascular disease, unspecified: Secondary | ICD-10-CM | POA: Diagnosis not present

## 2017-04-24 DIAGNOSIS — M21612 Bunion of left foot: Secondary | ICD-10-CM | POA: Diagnosis not present

## 2017-04-24 DIAGNOSIS — L97522 Non-pressure chronic ulcer of other part of left foot with fat layer exposed: Secondary | ICD-10-CM | POA: Diagnosis not present

## 2017-04-24 DIAGNOSIS — I83025 Varicose veins of left lower extremity with ulcer other part of foot: Secondary | ICD-10-CM | POA: Diagnosis not present

## 2017-04-25 NOTE — Progress Notes (Signed)
   Subjective:  Patient presents today for follow-up evaluation of a callus to the right plantar forefoot. She also complains of a painful ulcer on the left foot. There are no modifying factors noted. She is here for further evaluation and treatment.    Objective/Physical Exam General: The patient is alert and oriented x3 in no acute distress.  Dermatology:  Wound #1 noted to the dorsal medial aspect of the first MPJ left foot measuring approximately [phone] cm (LxWxD).   To the noted ulceration(s), there is no eschar. There is a moderate amount of slough, fibrin, and necrotic tissue noted. Granulation tissue and wound base is red. There is a minimal amount of serosanguineous drainage noted. There is no exposed bone muscle-tendon ligament or joint. There is no malodor. Periwound integrity is intact. Skin is warm, dry and supple bilateral lower extremities.  Vascular: Palpable pedal pulses bilaterally. Mild edema noted. Capillary refill within normal limits. Varicosities noted bilateral lower extremities.   Neurological: Epicritic and protective threshold intact bilaterally.   Musculoskeletal Exam: Range of motion within normal limits to all pedal and ankle joints bilateral. Muscle strength 5/5 in all groups bilateral. Clinical evidence of bunion deformity noted bilateral lower extremities.   Assessment: #1 left foot ulcer secondary to venous insufficiency #2 porokeratosis right sub-third MPJ #3 PVD  Plan of Care:  #1 Patient was evaluated. #2 medically necessary excisional debridement including subcutaneous tissue was performed using a tissue nipper and a chisel blade. Excisional debridement of all the necrotic nonviable tissue down to healthy bleeding viable tissue was performed with post-debridement measurements same as pre-. #3 the wound was cleansed with normal saline. #4 Excisional debridement of keratotic lesion using a chisel blade was performed without incident.  #5 Treated  area(s) with Salinocaine and dressed with light dressing. #6 referral to vascular for PVD/vascular workup #7 return to clinic in 3 weeks.   Edrick Kins, DPM [facility]  Dr. Edrick Kins, [address]                                        [place], [place] [address]                Office [phone]  Fax [phone]

## 2017-04-26 DIAGNOSIS — I83025 Varicose veins of left lower extremity with ulcer other part of foot: Secondary | ICD-10-CM | POA: Diagnosis not present

## 2017-04-26 DIAGNOSIS — I739 Peripheral vascular disease, unspecified: Secondary | ICD-10-CM | POA: Diagnosis not present

## 2017-04-26 DIAGNOSIS — L97529 Non-pressure chronic ulcer of other part of left foot with unspecified severity: Secondary | ICD-10-CM | POA: Diagnosis not present

## 2017-04-26 DIAGNOSIS — M21612 Bunion of left foot: Secondary | ICD-10-CM | POA: Diagnosis not present

## 2017-04-26 DIAGNOSIS — L97522 Non-pressure chronic ulcer of other part of left foot with fat layer exposed: Secondary | ICD-10-CM | POA: Diagnosis not present

## 2017-04-26 DIAGNOSIS — I10 Essential (primary) hypertension: Secondary | ICD-10-CM | POA: Diagnosis not present

## 2017-04-29 DIAGNOSIS — L97522 Non-pressure chronic ulcer of other part of left foot with fat layer exposed: Secondary | ICD-10-CM | POA: Diagnosis not present

## 2017-04-29 DIAGNOSIS — L97529 Non-pressure chronic ulcer of other part of left foot with unspecified severity: Secondary | ICD-10-CM | POA: Diagnosis not present

## 2017-04-29 DIAGNOSIS — I10 Essential (primary) hypertension: Secondary | ICD-10-CM | POA: Diagnosis not present

## 2017-04-29 DIAGNOSIS — M21612 Bunion of left foot: Secondary | ICD-10-CM | POA: Diagnosis not present

## 2017-04-29 DIAGNOSIS — I83025 Varicose veins of left lower extremity with ulcer other part of foot: Secondary | ICD-10-CM | POA: Diagnosis not present

## 2017-04-29 DIAGNOSIS — I739 Peripheral vascular disease, unspecified: Secondary | ICD-10-CM | POA: Diagnosis not present

## 2017-05-01 DIAGNOSIS — I739 Peripheral vascular disease, unspecified: Secondary | ICD-10-CM | POA: Diagnosis not present

## 2017-05-01 DIAGNOSIS — L97529 Non-pressure chronic ulcer of other part of left foot with unspecified severity: Secondary | ICD-10-CM | POA: Diagnosis not present

## 2017-05-01 DIAGNOSIS — I83025 Varicose veins of left lower extremity with ulcer other part of foot: Secondary | ICD-10-CM | POA: Diagnosis not present

## 2017-05-01 DIAGNOSIS — I10 Essential (primary) hypertension: Secondary | ICD-10-CM | POA: Diagnosis not present

## 2017-05-01 DIAGNOSIS — L97522 Non-pressure chronic ulcer of other part of left foot with fat layer exposed: Secondary | ICD-10-CM | POA: Diagnosis not present

## 2017-05-01 DIAGNOSIS — M21612 Bunion of left foot: Secondary | ICD-10-CM | POA: Diagnosis not present

## 2017-05-03 DIAGNOSIS — M21612 Bunion of left foot: Secondary | ICD-10-CM | POA: Diagnosis not present

## 2017-05-03 DIAGNOSIS — I83025 Varicose veins of left lower extremity with ulcer other part of foot: Secondary | ICD-10-CM | POA: Diagnosis not present

## 2017-05-03 DIAGNOSIS — I739 Peripheral vascular disease, unspecified: Secondary | ICD-10-CM | POA: Diagnosis not present

## 2017-05-03 DIAGNOSIS — L97522 Non-pressure chronic ulcer of other part of left foot with fat layer exposed: Secondary | ICD-10-CM | POA: Diagnosis not present

## 2017-05-03 DIAGNOSIS — L97529 Non-pressure chronic ulcer of other part of left foot with unspecified severity: Secondary | ICD-10-CM | POA: Diagnosis not present

## 2017-05-03 DIAGNOSIS — I10 Essential (primary) hypertension: Secondary | ICD-10-CM | POA: Diagnosis not present

## 2017-05-06 DIAGNOSIS — L97529 Non-pressure chronic ulcer of other part of left foot with unspecified severity: Secondary | ICD-10-CM | POA: Diagnosis not present

## 2017-05-06 DIAGNOSIS — L97522 Non-pressure chronic ulcer of other part of left foot with fat layer exposed: Secondary | ICD-10-CM | POA: Diagnosis not present

## 2017-05-06 DIAGNOSIS — I739 Peripheral vascular disease, unspecified: Secondary | ICD-10-CM | POA: Diagnosis not present

## 2017-05-06 DIAGNOSIS — I83025 Varicose veins of left lower extremity with ulcer other part of foot: Secondary | ICD-10-CM | POA: Diagnosis not present

## 2017-05-06 DIAGNOSIS — M21612 Bunion of left foot: Secondary | ICD-10-CM | POA: Diagnosis not present

## 2017-05-06 DIAGNOSIS — I10 Essential (primary) hypertension: Secondary | ICD-10-CM | POA: Diagnosis not present

## 2017-05-07 ENCOUNTER — Encounter: Payer: Self-pay | Admitting: Podiatry

## 2017-05-07 ENCOUNTER — Ambulatory Visit (INDEPENDENT_AMBULATORY_CARE_PROVIDER_SITE_OTHER): Payer: Medicare Other | Admitting: Podiatry

## 2017-05-07 DIAGNOSIS — I83025 Varicose veins of left lower extremity with ulcer other part of foot: Secondary | ICD-10-CM

## 2017-05-07 DIAGNOSIS — L97522 Non-pressure chronic ulcer of other part of left foot with fat layer exposed: Secondary | ICD-10-CM

## 2017-05-07 DIAGNOSIS — L97529 Non-pressure chronic ulcer of other part of left foot with unspecified severity: Secondary | ICD-10-CM

## 2017-05-07 NOTE — Progress Notes (Signed)
   Subjective:  Patient presents today for follow-up treatment and evaluation of an ulcer to the medial aspect of the first MPJ overlying the first metatarsal head. Patient states there is no change in the appearance of the wound. Patient presents today with her care provider who is been applying Prisma collagen every other day on the ulcer site. Patient states that is very sensitive to touch.   Objective/Physical Exam General: The patient is alert and oriented x3 in no acute distress.  Dermatology:  Wound #1 noted to the dorsal medial aspect of the first MPJ left foot measuring approximately [phone] cm (LxWxD).   To the noted ulceration(s), there is no eschar. There is a moderate amount of slough, fibrin, and necrotic tissue noted. Granulation tissue and wound base is red. There is a minimal amount of serosanguineous drainage noted. There is no exposed bone muscle-tendon ligament or joint. There is no malodor. Periwound integrity is intact. Skin is warm, dry and supple bilateral lower extremities. Ulceration is otherwise stable and remains unchanged since last visit  Vascular: Diminished pedal pulses bilaterally. Mild edema noted. Capillary refill within normal limits. Varicosities noted bilateral lower extremities.   Neurological: Epicritic and protective threshold intact bilaterally.   Musculoskeletal Exam: Range of motion within normal limits to all pedal and ankle joints bilateral. Muscle strength 5/5 in all groups bilateral. Clinical evidence of bunion deformity noted bilateral lower extremities.   Assessment: #1 left foot ulcer secondary to venous insufficiency #2 porokeratosis right sub-third MPJ #3 PVD  Plan of Care:  #1 Patient was evaluated. #2 medically necessary excisional debridement including subcutaneous tissue was performed using a tissue nipper and a chisel blade. Excisional debridement of all the necrotic nonviable tissue down to healthy bleeding viable tissue was  performed with post-debridement measurements same as pre-. #3 the wound was cleansed with normal saline. #4 dry sterile dressings applied. Continue dressing changes at home with home health provider #5 patient has an appointment July 31 with vascular #6 return to clinic in 4 weeks after her vascular appointment.   Edrick Kins, DPM [facility]  Dr. Edrick Kins, [address]                                        [place], [place] [address]                Office [phone]  Fax [phone]

## 2017-05-09 DIAGNOSIS — M21612 Bunion of left foot: Secondary | ICD-10-CM | POA: Diagnosis not present

## 2017-05-09 DIAGNOSIS — I739 Peripheral vascular disease, unspecified: Secondary | ICD-10-CM | POA: Diagnosis not present

## 2017-05-09 DIAGNOSIS — L97522 Non-pressure chronic ulcer of other part of left foot with fat layer exposed: Secondary | ICD-10-CM | POA: Diagnosis not present

## 2017-05-09 DIAGNOSIS — I10 Essential (primary) hypertension: Secondary | ICD-10-CM | POA: Diagnosis not present

## 2017-05-09 DIAGNOSIS — I83025 Varicose veins of left lower extremity with ulcer other part of foot: Secondary | ICD-10-CM | POA: Diagnosis not present

## 2017-05-09 DIAGNOSIS — L97529 Non-pressure chronic ulcer of other part of left foot with unspecified severity: Secondary | ICD-10-CM | POA: Diagnosis not present

## 2017-05-10 DIAGNOSIS — I10 Essential (primary) hypertension: Secondary | ICD-10-CM | POA: Diagnosis not present

## 2017-05-10 DIAGNOSIS — I739 Peripheral vascular disease, unspecified: Secondary | ICD-10-CM | POA: Diagnosis not present

## 2017-05-10 DIAGNOSIS — L97529 Non-pressure chronic ulcer of other part of left foot with unspecified severity: Secondary | ICD-10-CM | POA: Diagnosis not present

## 2017-05-10 DIAGNOSIS — M21612 Bunion of left foot: Secondary | ICD-10-CM | POA: Diagnosis not present

## 2017-05-10 DIAGNOSIS — I83025 Varicose veins of left lower extremity with ulcer other part of foot: Secondary | ICD-10-CM | POA: Diagnosis not present

## 2017-05-10 DIAGNOSIS — L97522 Non-pressure chronic ulcer of other part of left foot with fat layer exposed: Secondary | ICD-10-CM | POA: Diagnosis not present

## 2017-05-13 DIAGNOSIS — L97522 Non-pressure chronic ulcer of other part of left foot with fat layer exposed: Secondary | ICD-10-CM | POA: Diagnosis not present

## 2017-05-13 DIAGNOSIS — L97529 Non-pressure chronic ulcer of other part of left foot with unspecified severity: Secondary | ICD-10-CM | POA: Diagnosis not present

## 2017-05-13 DIAGNOSIS — I739 Peripheral vascular disease, unspecified: Secondary | ICD-10-CM | POA: Diagnosis not present

## 2017-05-13 DIAGNOSIS — M21612 Bunion of left foot: Secondary | ICD-10-CM | POA: Diagnosis not present

## 2017-05-13 DIAGNOSIS — I10 Essential (primary) hypertension: Secondary | ICD-10-CM | POA: Diagnosis not present

## 2017-05-13 DIAGNOSIS — I83025 Varicose veins of left lower extremity with ulcer other part of foot: Secondary | ICD-10-CM | POA: Diagnosis not present

## 2017-05-15 DIAGNOSIS — L97522 Non-pressure chronic ulcer of other part of left foot with fat layer exposed: Secondary | ICD-10-CM | POA: Diagnosis not present

## 2017-05-15 DIAGNOSIS — L97529 Non-pressure chronic ulcer of other part of left foot with unspecified severity: Secondary | ICD-10-CM | POA: Diagnosis not present

## 2017-05-15 DIAGNOSIS — I83025 Varicose veins of left lower extremity with ulcer other part of foot: Secondary | ICD-10-CM | POA: Diagnosis not present

## 2017-05-15 DIAGNOSIS — I739 Peripheral vascular disease, unspecified: Secondary | ICD-10-CM | POA: Diagnosis not present

## 2017-05-15 DIAGNOSIS — M21612 Bunion of left foot: Secondary | ICD-10-CM | POA: Diagnosis not present

## 2017-05-15 DIAGNOSIS — I10 Essential (primary) hypertension: Secondary | ICD-10-CM | POA: Diagnosis not present

## 2017-05-16 DIAGNOSIS — I83025 Varicose veins of left lower extremity with ulcer other part of foot: Secondary | ICD-10-CM | POA: Diagnosis not present

## 2017-05-16 DIAGNOSIS — M21612 Bunion of left foot: Secondary | ICD-10-CM | POA: Diagnosis not present

## 2017-05-16 DIAGNOSIS — I739 Peripheral vascular disease, unspecified: Secondary | ICD-10-CM | POA: Diagnosis not present

## 2017-05-16 DIAGNOSIS — L97522 Non-pressure chronic ulcer of other part of left foot with fat layer exposed: Secondary | ICD-10-CM | POA: Diagnosis not present

## 2017-05-16 DIAGNOSIS — I10 Essential (primary) hypertension: Secondary | ICD-10-CM | POA: Diagnosis not present

## 2017-05-16 DIAGNOSIS — L97529 Non-pressure chronic ulcer of other part of left foot with unspecified severity: Secondary | ICD-10-CM | POA: Diagnosis not present

## 2017-05-22 DIAGNOSIS — I739 Peripheral vascular disease, unspecified: Secondary | ICD-10-CM | POA: Diagnosis not present

## 2017-05-22 DIAGNOSIS — M21612 Bunion of left foot: Secondary | ICD-10-CM | POA: Diagnosis not present

## 2017-05-22 DIAGNOSIS — L97529 Non-pressure chronic ulcer of other part of left foot with unspecified severity: Secondary | ICD-10-CM | POA: Diagnosis not present

## 2017-05-22 DIAGNOSIS — L97522 Non-pressure chronic ulcer of other part of left foot with fat layer exposed: Secondary | ICD-10-CM | POA: Diagnosis not present

## 2017-05-22 DIAGNOSIS — I10 Essential (primary) hypertension: Secondary | ICD-10-CM | POA: Diagnosis not present

## 2017-05-22 DIAGNOSIS — I83025 Varicose veins of left lower extremity with ulcer other part of foot: Secondary | ICD-10-CM | POA: Diagnosis not present

## 2017-05-23 DIAGNOSIS — M21612 Bunion of left foot: Secondary | ICD-10-CM | POA: Diagnosis not present

## 2017-05-23 DIAGNOSIS — I83025 Varicose veins of left lower extremity with ulcer other part of foot: Secondary | ICD-10-CM | POA: Diagnosis not present

## 2017-05-23 DIAGNOSIS — L97529 Non-pressure chronic ulcer of other part of left foot with unspecified severity: Secondary | ICD-10-CM | POA: Diagnosis not present

## 2017-05-23 DIAGNOSIS — L97522 Non-pressure chronic ulcer of other part of left foot with fat layer exposed: Secondary | ICD-10-CM | POA: Diagnosis not present

## 2017-05-23 DIAGNOSIS — I10 Essential (primary) hypertension: Secondary | ICD-10-CM | POA: Diagnosis not present

## 2017-05-23 DIAGNOSIS — I739 Peripheral vascular disease, unspecified: Secondary | ICD-10-CM | POA: Diagnosis not present

## 2017-05-24 DIAGNOSIS — M21612 Bunion of left foot: Secondary | ICD-10-CM | POA: Diagnosis not present

## 2017-05-24 DIAGNOSIS — L97529 Non-pressure chronic ulcer of other part of left foot with unspecified severity: Secondary | ICD-10-CM | POA: Diagnosis not present

## 2017-05-24 DIAGNOSIS — I739 Peripheral vascular disease, unspecified: Secondary | ICD-10-CM | POA: Diagnosis not present

## 2017-05-24 DIAGNOSIS — L97522 Non-pressure chronic ulcer of other part of left foot with fat layer exposed: Secondary | ICD-10-CM | POA: Diagnosis not present

## 2017-05-24 DIAGNOSIS — I10 Essential (primary) hypertension: Secondary | ICD-10-CM | POA: Diagnosis not present

## 2017-05-24 DIAGNOSIS — I83025 Varicose veins of left lower extremity with ulcer other part of foot: Secondary | ICD-10-CM | POA: Diagnosis not present

## 2017-05-27 DIAGNOSIS — M21612 Bunion of left foot: Secondary | ICD-10-CM | POA: Diagnosis not present

## 2017-05-27 DIAGNOSIS — I739 Peripheral vascular disease, unspecified: Secondary | ICD-10-CM | POA: Diagnosis not present

## 2017-05-27 DIAGNOSIS — I10 Essential (primary) hypertension: Secondary | ICD-10-CM | POA: Diagnosis not present

## 2017-05-27 DIAGNOSIS — I83025 Varicose veins of left lower extremity with ulcer other part of foot: Secondary | ICD-10-CM | POA: Diagnosis not present

## 2017-05-27 DIAGNOSIS — L97529 Non-pressure chronic ulcer of other part of left foot with unspecified severity: Secondary | ICD-10-CM | POA: Diagnosis not present

## 2017-05-27 DIAGNOSIS — L97522 Non-pressure chronic ulcer of other part of left foot with fat layer exposed: Secondary | ICD-10-CM | POA: Diagnosis not present

## 2017-05-28 DIAGNOSIS — M21612 Bunion of left foot: Secondary | ICD-10-CM | POA: Diagnosis not present

## 2017-05-28 DIAGNOSIS — I10 Essential (primary) hypertension: Secondary | ICD-10-CM | POA: Diagnosis not present

## 2017-05-28 DIAGNOSIS — I83025 Varicose veins of left lower extremity with ulcer other part of foot: Secondary | ICD-10-CM | POA: Diagnosis not present

## 2017-05-28 DIAGNOSIS — L97522 Non-pressure chronic ulcer of other part of left foot with fat layer exposed: Secondary | ICD-10-CM | POA: Diagnosis not present

## 2017-05-28 DIAGNOSIS — I739 Peripheral vascular disease, unspecified: Secondary | ICD-10-CM | POA: Diagnosis not present

## 2017-05-29 DIAGNOSIS — L97522 Non-pressure chronic ulcer of other part of left foot with fat layer exposed: Secondary | ICD-10-CM | POA: Diagnosis not present

## 2017-05-29 DIAGNOSIS — M21612 Bunion of left foot: Secondary | ICD-10-CM | POA: Diagnosis not present

## 2017-05-29 DIAGNOSIS — I10 Essential (primary) hypertension: Secondary | ICD-10-CM | POA: Diagnosis not present

## 2017-05-29 DIAGNOSIS — I739 Peripheral vascular disease, unspecified: Secondary | ICD-10-CM | POA: Diagnosis not present

## 2017-05-29 DIAGNOSIS — I83025 Varicose veins of left lower extremity with ulcer other part of foot: Secondary | ICD-10-CM | POA: Diagnosis not present

## 2017-05-31 DIAGNOSIS — I10 Essential (primary) hypertension: Secondary | ICD-10-CM | POA: Diagnosis not present

## 2017-05-31 DIAGNOSIS — M21612 Bunion of left foot: Secondary | ICD-10-CM | POA: Diagnosis not present

## 2017-05-31 DIAGNOSIS — I739 Peripheral vascular disease, unspecified: Secondary | ICD-10-CM | POA: Diagnosis not present

## 2017-05-31 DIAGNOSIS — L97522 Non-pressure chronic ulcer of other part of left foot with fat layer exposed: Secondary | ICD-10-CM | POA: Diagnosis not present

## 2017-05-31 DIAGNOSIS — I83025 Varicose veins of left lower extremity with ulcer other part of foot: Secondary | ICD-10-CM | POA: Diagnosis not present

## 2017-06-01 ENCOUNTER — Inpatient Hospital Stay
Admission: EM | Admit: 2017-06-01 | Discharge: 2017-06-06 | DRG: 386 | Disposition: A | Discharge status: Home Health | Payer: Medicare Other | Attending: Internal Medicine | Admitting: Internal Medicine

## 2017-06-01 ENCOUNTER — Emergency Department: Payer: Medicare Other

## 2017-06-01 DIAGNOSIS — M87859 Other osteonecrosis, unspecified femur: Secondary | ICD-10-CM | POA: Diagnosis not present

## 2017-06-01 DIAGNOSIS — Z90710 Acquired absence of both cervix and uterus: Secondary | ICD-10-CM

## 2017-06-01 DIAGNOSIS — K5090 Crohn's disease, unspecified, without complications: Secondary | ICD-10-CM | POA: Diagnosis not present

## 2017-06-01 DIAGNOSIS — J449 Chronic obstructive pulmonary disease, unspecified: Secondary | ICD-10-CM | POA: Diagnosis not present

## 2017-06-01 DIAGNOSIS — R059 Cough, unspecified: Secondary | ICD-10-CM

## 2017-06-01 DIAGNOSIS — R05 Cough: Secondary | ICD-10-CM

## 2017-06-01 DIAGNOSIS — Z79899 Other long term (current) drug therapy: Secondary | ICD-10-CM

## 2017-06-01 DIAGNOSIS — E785 Hyperlipidemia, unspecified: Secondary | ICD-10-CM | POA: Diagnosis present

## 2017-06-01 DIAGNOSIS — Z7902 Long term (current) use of antithrombotics/antiplatelets: Secondary | ICD-10-CM

## 2017-06-01 DIAGNOSIS — J9811 Atelectasis: Secondary | ICD-10-CM | POA: Diagnosis not present

## 2017-06-01 DIAGNOSIS — K633 Ulcer of intestine: Secondary | ICD-10-CM | POA: Diagnosis not present

## 2017-06-01 DIAGNOSIS — F17210 Nicotine dependence, cigarettes, uncomplicated: Secondary | ICD-10-CM | POA: Diagnosis present

## 2017-06-01 DIAGNOSIS — K56699 Other intestinal obstruction unspecified as to partial versus complete obstruction: Secondary | ICD-10-CM | POA: Diagnosis present

## 2017-06-01 DIAGNOSIS — J441 Chronic obstructive pulmonary disease with (acute) exacerbation: Secondary | ICD-10-CM | POA: Diagnosis present

## 2017-06-01 DIAGNOSIS — Z720 Tobacco use: Secondary | ICD-10-CM | POA: Diagnosis not present

## 2017-06-01 DIAGNOSIS — K219 Gastro-esophageal reflux disease without esophagitis: Secondary | ICD-10-CM | POA: Diagnosis present

## 2017-06-01 DIAGNOSIS — I10 Essential (primary) hypertension: Secondary | ICD-10-CM | POA: Diagnosis present

## 2017-06-01 DIAGNOSIS — K5190 Ulcerative colitis, unspecified, without complications: Secondary | ICD-10-CM | POA: Diagnosis not present

## 2017-06-01 DIAGNOSIS — K922 Gastrointestinal hemorrhage, unspecified: Secondary | ICD-10-CM | POA: Diagnosis present

## 2017-06-01 DIAGNOSIS — Z716 Tobacco abuse counseling: Secondary | ICD-10-CM

## 2017-06-01 DIAGNOSIS — K625 Hemorrhage of anus and rectum: Secondary | ICD-10-CM | POA: Diagnosis present

## 2017-06-01 DIAGNOSIS — G629 Polyneuropathy, unspecified: Secondary | ICD-10-CM | POA: Diagnosis present

## 2017-06-01 DIAGNOSIS — Z66 Do not resuscitate: Secondary | ICD-10-CM | POA: Diagnosis present

## 2017-06-01 DIAGNOSIS — D649 Anemia, unspecified: Secondary | ICD-10-CM | POA: Diagnosis present

## 2017-06-01 LAB — HEMOGLOBIN: HEMOGLOBIN: 11.2 g/dL — AB (ref 12.0–16.0)

## 2017-06-01 LAB — CBC WITH DIFFERENTIAL/PLATELET
BASOS ABS: 0 10*3/uL (ref 0–0.1)
BASOS PCT: 0 %
Eosinophils Absolute: 0.1 10*3/uL (ref 0–0.7)
Eosinophils Relative: 1 %
HEMATOCRIT: 33.2 % — AB (ref 35.0–47.0)
HEMOGLOBIN: 11.2 g/dL — AB (ref 12.0–16.0)
LYMPHS PCT: 14 %
Lymphs Abs: 0.9 10*3/uL — ABNORMAL LOW (ref 1.0–3.6)
MCH: 31.2 pg (ref 26.0–34.0)
MCHC: 33.6 g/dL (ref 32.0–36.0)
MCV: 92.9 fL (ref 80.0–100.0)
MONO ABS: 0.5 10*3/uL (ref 0.2–0.9)
Monocytes Relative: 8 %
Neutro Abs: 5.2 10*3/uL (ref 1.4–6.5)
Neutrophils Relative %: 77 %
Platelets: 309 10*3/uL (ref 150–440)
RBC: 3.57 MIL/uL — AB (ref 3.80–5.20)
RDW: 14.9 % — ABNORMAL HIGH (ref 11.5–14.5)
WBC: 6.8 10*3/uL (ref 3.6–11.0)

## 2017-06-01 LAB — COMPREHENSIVE METABOLIC PANEL
ALBUMIN: 3.3 g/dL — AB (ref 3.5–5.0)
ALK PHOS: 49 U/L (ref 38–126)
ALT: 6 U/L — AB (ref 14–54)
ANION GAP: 6 (ref 5–15)
AST: 18 U/L (ref 15–41)
BILIRUBIN TOTAL: 0.3 mg/dL (ref 0.3–1.2)
BUN: 28 mg/dL — AB (ref 6–20)
CALCIUM: 9.5 mg/dL (ref 8.9–10.3)
CO2: 32 mmol/L (ref 22–32)
CREATININE: 1.07 mg/dL — AB (ref 0.44–1.00)
Chloride: 100 mmol/L — ABNORMAL LOW (ref 101–111)
GFR calc Af Amer: 53 mL/min — ABNORMAL LOW (ref >60)
GFR calc non Af Amer: 45 mL/min — ABNORMAL LOW (ref >60)
GLUCOSE: 98 mg/dL (ref 65–99)
Potassium: 3.8 mmol/L (ref 3.5–5.1)
Sodium: 138 mmol/L (ref 135–145)
TOTAL PROTEIN: 6.5 g/dL (ref 6.5–8.1)

## 2017-06-01 LAB — URINALYSIS, COMPLETE (UACMP) WITH MICROSCOPIC
Bacteria, UA: NONE SEEN
Bilirubin Urine: NEGATIVE
GLUCOSE, UA: NEGATIVE mg/dL
Hgb urine dipstick: NEGATIVE
Ketones, ur: NEGATIVE mg/dL
Nitrite: NEGATIVE
PH: 6 (ref 5.0–8.0)
Protein, ur: NEGATIVE mg/dL
SPECIFIC GRAVITY, URINE: 1.008 (ref 1.005–1.030)
SQUAMOUS EPITHELIAL / LPF: NONE SEEN

## 2017-06-01 LAB — PROTIME-INR
INR: 0.99
Prothrombin Time: 13.1 seconds (ref 11.4–15.2)

## 2017-06-01 LAB — TYPE AND SCREEN
ABO/RH(D): A NEG
Antibody Screen: NEGATIVE

## 2017-06-01 LAB — LIPASE, BLOOD: Lipase: 42 U/L (ref 11–51)

## 2017-06-01 MED ORDER — GABAPENTIN 100 MG PO CAPS
200.0000 mg | ORAL_CAPSULE | Freq: Every day | ORAL | Status: DC
  Administered 2017-06-01 – 2017-06-05 (×5): 200 mg via ORAL
  Filled 2017-06-01 (×5): qty 2

## 2017-06-01 MED ORDER — ZOLPIDEM TARTRATE 5 MG PO TABS: 10.0000 mg | ORAL_TABLET | Freq: Every evening | ORAL | Status: DC | PRN

## 2017-06-01 MED ORDER — FERROUS SULFATE 325 (65 FE) MG PO TABS
325.0000 mg | ORAL_TABLET | Freq: Every day | ORAL | Status: DC
  Administered 2017-06-02 – 2017-06-06 (×5): 325 mg via ORAL
  Filled 2017-06-01 (×6): qty 1

## 2017-06-01 MED ORDER — ACETAMINOPHEN 650 MG RE SUPP: 650.0000 mg | Freq: Four times a day (QID) | RECTAL | Status: DC | PRN

## 2017-06-01 MED ORDER — ADULT MULTIVITAMIN W/MINERALS CH
1.0000 | ORAL_TABLET | Freq: Every day | ORAL | Status: DC
  Administered 2017-06-02 – 2017-06-06 (×5): 1 via ORAL
  Filled 2017-06-01 (×5): qty 1

## 2017-06-01 MED ORDER — LORATADINE 10 MG PO TABS: 10.0000 mg | ORAL_TABLET | Freq: Every day | ORAL | Status: DC | PRN

## 2017-06-01 MED ORDER — GABAPENTIN 100 MG PO CAPS: 100.0000 mg | ORAL_CAPSULE | ORAL | Status: DC

## 2017-06-01 MED ORDER — IPRATROPIUM-ALBUTEROL 0.5-2.5 (3) MG/3ML IN SOLN
3.0000 mL | Freq: Four times a day (QID) | RESPIRATORY_TRACT | Status: DC
  Administered 2017-06-01 – 2017-06-02 (×5): 3 mL via RESPIRATORY_TRACT
  Filled 2017-06-01 (×4): qty 3

## 2017-06-01 MED ORDER — OMEGA-3-ACID ETHYL ESTERS 1 G PO CAPS
1.0000 g | ORAL_CAPSULE | Freq: Every day | ORAL | Status: DC
  Administered 2017-06-02 – 2017-06-06 (×5): 1 g via ORAL
  Filled 2017-06-01 (×5): qty 1

## 2017-06-01 MED ORDER — FOLIC ACID 1 MG PO TABS
1.0000 mg | ORAL_TABLET | Freq: Every day | ORAL | Status: DC
  Administered 2017-06-02 – 2017-06-06 (×5): 1 mg via ORAL
  Filled 2017-06-01 (×5): qty 1

## 2017-06-01 MED ORDER — CALCIUM CARBONATE-VITAMIN D 500-200 MG-UNIT PO TABS
1.0000 | ORAL_TABLET | Freq: Two times a day (BID) | ORAL | Status: DC
  Administered 2017-06-01 – 2017-06-06 (×9): 1 via ORAL
  Filled 2017-06-01 (×9): qty 1

## 2017-06-01 MED ORDER — MESALAMINE 1.2 G PO TBEC
2.4000 g | DELAYED_RELEASE_TABLET | Freq: Every day | ORAL | Status: DC
  Administered 2017-06-02 – 2017-06-03 (×2): 2.4 g via ORAL
  Filled 2017-06-01 (×2): qty 2

## 2017-06-01 MED ORDER — PREDNISONE 20 MG PO TABS
40.0000 mg | ORAL_TABLET | Freq: Every day | ORAL | Status: DC
  Administered 2017-06-02: 40 mg via ORAL
  Filled 2017-06-01: qty 2

## 2017-06-01 MED ORDER — SODIUM CHLORIDE 0.9 % IV SOLN
INTRAVENOUS | Status: DC
  Administered 2017-06-01: 17:00:00 via INTRAVENOUS

## 2017-06-01 MED ORDER — LORATADINE 10 MG PO TABS
10.0000 mg | ORAL_TABLET | Freq: Every day | ORAL | Status: DC
  Administered 2017-06-02 – 2017-06-06 (×5): 10 mg via ORAL
  Filled 2017-06-01 (×5): qty 1

## 2017-06-01 MED ORDER — ZOLPIDEM TARTRATE 5 MG PO TABS
5.0000 mg | ORAL_TABLET | Freq: Every evening | ORAL | Status: DC | PRN
  Administered 2017-06-01 – 2017-06-05 (×5): 5 mg via ORAL
  Filled 2017-06-01 (×5): qty 1

## 2017-06-01 MED ORDER — PANTOPRAZOLE SODIUM 40 MG PO TBEC
40.0000 mg | DELAYED_RELEASE_TABLET | Freq: Every day | ORAL | Status: DC
  Administered 2017-06-02 – 2017-06-06 (×5): 40 mg via ORAL
  Filled 2017-06-01 (×5): qty 1

## 2017-06-01 MED ORDER — GABAPENTIN 100 MG PO CAPS
100.0000 mg | ORAL_CAPSULE | Freq: Every morning | ORAL | Status: DC
  Administered 2017-06-02 – 2017-06-06 (×4): 100 mg via ORAL
  Filled 2017-06-01 (×4): qty 1

## 2017-06-01 MED ORDER — ACETAMINOPHEN 325 MG PO TABS: 650.0000 mg | ORAL_TABLET | Freq: Four times a day (QID) | ORAL | Status: DC | PRN

## 2017-06-01 NOTE — ED Triage Notes (Signed)
Pt came to ED via EMS from home c/o rectal bleeding since yesterday with bowel movements. Pt has history of colitis and reports bleeding when it flares up. No pain.

## 2017-06-01 NOTE — ED Provider Notes (Signed)
[facility] Emergency Department Provider Note  ____________________________________________   I have reviewed the triage vital signs and the nursing notes.   HISTORY  Chief Complaint Rectal Bleeding    HPI Lindsey Hobbs is a 83 y.o. female who presents today complaining of rectal bleeding. She has had this in the past. She has no pain. She does have a history of ulcerative colitis and she states sometimes causes bleeding. She has not had significant diarrhea. She states that she has had several episodes of bright red painless bleeding per rectum. She denies any fever chills cough shortness of breath. She does have a history of COPD. She denies taking any anticoagulation medication at this time. She has had this in the past. Started the day before yesterday, no bleeding yesterday but. Multiple different episodes since then.       Past Medical History:  Diagnosis Date  . Anemia   . Avascular necrosis of bone of hip ([place])   . COPD (chronic obstructive pulmonary disease) ([place])   . Guillain Barr syndrome ([place])   . Hyperlipidemia   . Hypertension   . Ulcerative colitis [facility])     Patient Active Problem List   Diagnosis Date Noted  . GI bleed 07/05/2015    Past Surgical History:  Procedure Laterality Date  . ABDOMINAL HYSTERECTOMY    . COLONOSCOPY WITH PROPOFOL Left 07/07/2015   Procedure: COLONOSCOPY WITH PROPOFOL;  Surgeon: Hulen Luster, MD;  Location: [facility] ENDOSCOPY;  Service: Endoscopy;  Laterality: Left;    Prior to Admission medications   Medication Sig Start Date End Date Taking? Authorizing Provider  Calcium Carbonate-Vitamin D (CALCIUM 600+D) 600-400 MG-UNIT per tablet Take 1 tablet by mouth 2 (two) times daily.   Yes [provider]  clopidogrel (PLAVIX) 75 MG tablet Take 75 mg by mouth daily. 05/30/17  Yes [provider]  ferrous sulfate 325 (65 FE) MG tablet Take 1 tablet (325 mg total) by mouth 2 (two) times daily with a  meal. Patient taking differently: Take 325 mg by mouth daily with breakfast.  07/08/15  Yes Sudini, Alveta Heimlich, MD  fexofenadine (ALLEGRA) 180 MG tablet Take 180 mg by mouth at bedtime.   Yes [provider]  folic acid (FOLVITE) 1 MG tablet Take 1 mg by mouth daily.   Yes [provider]  gabapentin (NEURONTIN) 100 MG capsule Take 100-300 mg by mouth See admin instructions. One in the morning and 2 at night   Yes [provider]  loratadine (CLARITIN) 10 MG tablet Take 10 mg by mouth daily as needed for allergies.   Yes [provider]  mesalamine (LIALDA) 1.2 g EC tablet Take 2.4 g by mouth daily with breakfast.   Yes [provider]  Multiple Vitamin (MULTIVITAMIN WITH MINERALS) TABS tablet Take 1 tablet by mouth daily.   Yes [provider]  Omega-3 Fatty Acids (FISH OIL PO) Take 1 capsule by mouth 3 (three) times daily.   Yes [provider]  pantoprazole (PROTONIX) 40 MG tablet Take 40 mg by mouth daily. 05/13/17  Yes [provider]  zolpidem (AMBIEN) 10 MG tablet Take 10 mg by mouth at bedtime as needed for sleep.   Yes [provider]    Allergies Patient has no known allergies.  Family History  Problem Relation Age of Onset  . Breast cancer Mother     Social History Social History  Substance Use Topics  . Smoking status: Current Every Day Smoker  Packs/day: 0.50  . Smokeless tobacco: Never Used  . Alcohol use No    Review of Systems Constitutional: No fever/chills Eyes: No visual changes. ENT: No sore throat. No stiff neck no neck pain Cardiovascular: Denies chest pain. Respiratory: Denies shortness of breath. Gastrointestinal:   no vomiting.  No diarrhea.  No constipation. Genitourinary: Negative for dysuria. Musculoskeletal: Negative lower extremity swelling Skin: Negative for rash. Neurological: Negative for severe headaches, focal weakness or  numbness.   ____________________________________________   PHYSICAL EXAM:  VITAL SIGNS: ED Triage Vitals  Enc Vitals Group     BP --      Pulse --      Resp --      Temp --      Temp src --      SpO2 --      Weight 06/01/17 1033 127 lb (57.6 kg)     Height 06/01/17 1033 5\' 4"  (1.626 m)     Head Circumference --      Peak Flow --      Pain Score 06/01/17 1120 2     Pain Loc --      Pain Edu? --      Excl. in [place]? --     Constitutional: Alert and oriented. Well appearing and in no acute distress. Eyes: Conjunctivae are normal Head: Atraumatic HEENT: No congestion/rhinnorhea. Mucous membranes are moist.  Oropharynx non-erythematous Neck:   Nontender with no meningismus, no masses, no stridor Cardiovascular: Normal rate, regular rhythm. Grossly normal heart sounds.  Good peripheral circulation. Respiratory: Normal respiratory effort.  No retractions. Lungs CTAB. Abdominal: Soft and nontender. No distention. No guarding no rebound Back:  There is no focal tenderness or step off.  there is no midline tenderness there are no lesions noted. there is no CVA tenderness Rectal exam: Female chaperone present, guaiac positive brown/slightly reddish stool Musculoskeletal: No lower extremity tenderness, no upper extremity tenderness. No joint effusions, no DVT signs strong distal pulses no edema Neurologic:  Normal speech and language. No gross focal neurologic deficits are appreciated.  Skin:  Skin is warm, dry and intact. No rash noted. Psychiatric: Mood and affect are normal. Speech and behavior are normal.  ____________________________________________   LABS (all labs ordered are listed, but only abnormal results are displayed)  Labs Reviewed  COMPREHENSIVE METABOLIC PANEL - Abnormal; Notable for the following:       Result Value   Chloride 100 (*)    BUN 28 (*)    Creatinine, Ser 1.07 (*)    Albumin 3.3 (*)    ALT 6 (*)    GFR calc non Af Amer 45 (*)    GFR calc Af Amer  53 (*)    All other components within normal limits  CBC WITH DIFFERENTIAL/PLATELET - Abnormal; Notable for the following:    RBC 3.57 (*)    Hemoglobin 11.2 (*)    HCT 33.2 (*)    RDW 14.9 (*)    Lymphs Abs 0.9 (*)    All other components within normal limits  LIPASE, BLOOD  URINALYSIS, COMPLETE (UACMP) WITH MICROSCOPIC  TYPE AND SCREEN   ____________________________________________  EKG  I personally interpreted any EKGs ordered by me or triage  ____________________________________________  RADIOLOGY  I reviewed any imaging ordered by me or triage that were performed during my shift and, if possible, patient and/or family made aware of any abnormal findings. ____________________________________________   PROCEDURES  Procedure(s) performed: None  Procedures  Critical Care performed: CRITICAL CARE Performed  by: Kang Ishida A Presley Gora   Total critical care time: 42 minutes  Critical care time was exclusive of separately billable procedures and treating other patients.  Critical care was necessary to treat or prevent imminent or life-threatening deterioration.  Critical care was time spent personally by me on the following activities: development of treatment plan with patient and/or surrogate as well as nursing, discussions with consultants, evaluation of patient's response to treatment, examination of patient, obtaining history from patient or surrogate, ordering and performing treatments and interventions, ordering and review of laboratory studies, ordering and review of radiographic studies, pulse oximetry and re-evaluation of patient's condition.   ____________________________________________   INITIAL IMPRESSION / ASSESSMENT AND PLAN / ED COURSE  Pertinent labs & imaging results that were available during my care of the patient were reviewed by me and considered in my medical decision making (see chart for details).  Patient here with rectal bleeding hemoglobin and  creatinine are reassuring, she will need admission and possible transfusion if she continues to bleed. We are reassured by her flank. She does have COPD, lungs are clear but has borderline low sats. We'll place her on 2 L of oxygen this is likely her baseline however. We will also obtain chest x-ray is a precaution. Admitted to the hospitalist service.    ____________________________________________   FINAL CLINICAL IMPRESSION(S) / ED DIAGNOSES  Final diagnoses:  Rectal bleeding      This chart was dictated using voice recognition software.  Despite best efforts to proofread,  errors can occur which can change meaning.      Schuyler Amor, MD 06/01/17 1236

## 2017-06-01 NOTE — Progress Notes (Signed)
Per P&T Committee, reduced zolpidem 10mg  qhs prn to zolpidem 5mg  qhs prn due to patient >81 years old.   [place] Resident  06/01/17

## 2017-06-01 NOTE — H&P (Signed)
Lindsey Hobbs is an 83 y.o. female.   Chief Complaint: Bright red blood per rectum HPI: This 83 year old female who has a history of ulcerative colitis. She had a GI bleed from a flareup couple years ago. She does spike red blood per rectum on several bowel movements today. She's had no abdominal tenderness or cramping pain. We will admit her for further evaluation  Past Medical History:  Diagnosis Date  . Anemia   . Avascular necrosis of bone of hip ([place])   . COPD (chronic obstructive pulmonary disease) ([place])   . Guillain Barr syndrome ([place])   . Hyperlipidemia   . Hypertension   . Ulcerative colitis [facility])     Past Surgical History:  Procedure Laterality Date  . ABDOMINAL HYSTERECTOMY    . COLONOSCOPY WITH PROPOFOL Left 07/07/2015   Procedure: COLONOSCOPY WITH PROPOFOL;  Surgeon: Hulen Luster, MD;  [facility] ENDOSCOPY;  Service: Endoscopy;  Laterality: Left;    Family History  Problem Relation Age of Onset  . Breast cancer Mother    Social History:  reports that she has been smoking.  She has been smoking about 0.50 packs per day. She has never used smokeless tobacco. She reports that she does not drink alcohol. Her drug history is not on file.  Allergies: No Known Allergies   (Not in a hospital admission)  Results for orders placed or performed during the hospital encounter of 06/01/17 (from the past 48 hour(s))  Comprehensive metabolic panel     Status: Abnormal   Collection Time: 06/01/17 11:09 AM  Result Value Ref Range   Sodium 138 135 - 145 mmol/L   Potassium 3.8 3.5 - 5.1 mmol/L   Chloride 100 (L) 101 - 111 mmol/L   CO2 32 22 - 32 mmol/L   Glucose, Bld 98 65 - 99 mg/dL   BUN 28 (H) 6 - 20 mg/dL   Creatinine, Ser 1.07 (H) 0.44 - 1.00 mg/dL   Calcium 9.5 8.9 - 10.3 mg/dL   Total Protein 6.5 6.5 - 8.1 g/dL   Albumin 3.3 (L) 3.5 - 5.0 g/dL   AST 18 15 - 41 U/L   ALT 6 (L) 14 - 54 U/L   Alkaline Phosphatase 49 38 - 126 U/L   Total Bilirubin 0.3 0.3 - 1.2 mg/dL   GFR calc non Af Amer 45 (L) >60 mL/min   GFR calc Af Amer 53 (L) >60 mL/min    Comment: (NOTE) The eGFR has been calculated using the CKD EPI equation. This calculation has not been validated in all clinical situations. eGFR's persistently <60 mL/min signify possible Chronic Kidney Disease.    Anion gap 6 5 - 15  Lipase, blood     Status: None   Collection Time: 06/01/17 11:09 AM  Result Value Ref Range   Lipase 42 11 - 51 U/L  CBC with Differential     Status: Abnormal   Collection Time: 06/01/17 11:09 AM  Result Value Ref Range   WBC 6.8 3.6 - 11.0 K/uL   RBC 3.57 (L) 3.80 - 5.20 MIL/uL   Hemoglobin 11.2 (L) 12.0 - 16.0 g/dL   HCT 33.2 (L) 35.0 - 47.0 %   MCV 92.9 80.0 - 100.0 fL   MCH 31.2 26.0 - 34.0 pg   MCHC 33.6 32.0 - 36.0 g/dL   RDW 14.9 (H) 11.5 - 14.5 %   Platelets 309 150 - 440 K/uL   Neutrophils Relative % 77 %   Neutro Abs 5.2 1.4 -  6.5 K/uL   Lymphocytes Relative 14 %   Lymphs Abs 0.9 (L) 1.0 - 3.6 K/uL   Monocytes Relative 8 %   Monocytes Absolute 0.5 0.2 - 0.9 K/uL   Eosinophils Relative 1 %   Eosinophils Absolute 0.1 0 - 0.7 K/uL   Basophils Relative 0 %   Basophils Absolute 0.0 0 - 0.1 K/uL  Type and screen [facility]     Status: None   Collection Time: 06/01/17 11:10 AM  Result Value Ref Range   ABO/RH(D) A NEG    Antibody Screen NEG    Sample Expiration 06/04/2017   Protime-INR     Status: None   Collection Time: 06/01/17 12:42 PM  Result Value Ref Range   Prothrombin Time 13.1 11.4 - 15.2 seconds   INR 0.99    Dg Chest 2 View  Result Date: 06/01/2017 CLINICAL DATA:  Low O2 sats EXAM: CHEST  2 VIEW COMPARISON:  02/28/2011 FINDINGS: There is hyperinflation of the lungs compatible with COPD. Atelectasis in the left mid lung. No confluent opacity on the right. No effusions. Heart is borderline in size. IMPRESSION: COPD.  Left perihilar atelectasis.  Borderline heart size. Electronically Signed   By: Rolm Baptise M.D.   On:  06/01/2017 12:36    Review of Systems  Constitutional: Negative for chills and fever.  HENT: Negative for hearing loss.   Eyes: Negative for blurred vision.  Respiratory: Positive for wheezing. Negative for cough.   Cardiovascular: Negative for chest pain.  Gastrointestinal: Positive for blood in stool.  Genitourinary: Negative for dysuria.  Musculoskeletal: Negative for myalgias.  Skin: Negative for rash.  Neurological: Negative for dizziness.    Height _0  (1.626 m), weight 57.6 kg (127 lb). Physical Exam  Constitutional: She is oriented to person, place, and time. She appears well-developed and well-nourished. No distress.  HENT:  Head: Normocephalic and atraumatic.  Mouth/Throat: Oropharynx is clear and moist. No oropharyngeal exudate.  Eyes: Pupils are equal, round, and reactive to light. EOM are normal. Left eye exhibits no discharge.  Neck: Neck supple. No JVD present. No tracheal deviation present. No thyromegaly present.  Cardiovascular: Normal rate and regular rhythm.   No murmur heard. Respiratory: Breath sounds normal. No respiratory distress. She has no wheezes. She has no rales. She exhibits no tenderness.  GI: Soft. Bowel sounds are normal. She exhibits no distension. There is no tenderness. There is no rebound and no guarding.  Musculoskeletal: Normal range of motion. She exhibits no edema.  Lymphadenopathy:    She has no cervical adenopathy.  Neurological: She is alert and oriented to person, place, and time. No cranial nerve deficit.  Skin: Skin is warm and dry.     Assessment/Plan 1. GI bleed. Likely lower GI and may be secondary from an early flare of ulcerative colitis. We will go ahead and check frequent hemoglobins. Give her clear liquid diet for now. Consult GI for possible colonoscopy. She does have Plavix listed on her medication list however she says she does not think she's taking it according to pharmacy was filled this month. I cannot find  documentation of this medication on her medical records or why she would be on this type medication. Obviously will hold it for now. 2. Ulcerative colitis. She may be having early flare that caused this GI bleeding. She's not having any symptoms of tenderness and hasn't had diarrhea until she had bloody stools today. I'll go ahead and empirically start her on some prednisone.  3. COPD exacerbation. She is audibly wheezing. She does continue to smoke. Go ahead and put her on some bronchodilator therapy and also some steroids. 4. Tobacco abuse. Counseled her on smoking cessation.  Total time spent 50 minutes Baxter Hire, MD 06/01/2017, 1:14 PM

## 2017-06-01 NOTE — Progress Notes (Signed)
Placed patient back on oxygen at 2l after neb treatment. Patients sats were 98 after neb.

## 2017-06-02 ENCOUNTER — Encounter: Payer: Self-pay | Admitting: Gastroenterology

## 2017-06-02 DIAGNOSIS — K625 Hemorrhage of anus and rectum: Secondary | ICD-10-CM | POA: Diagnosis present

## 2017-06-02 DIAGNOSIS — K5100 Ulcerative (chronic) pancolitis without complications: Secondary | ICD-10-CM

## 2017-06-02 DIAGNOSIS — J449 Chronic obstructive pulmonary disease, unspecified: Secondary | ICD-10-CM | POA: Diagnosis not present

## 2017-06-02 DIAGNOSIS — K922 Gastrointestinal hemorrhage, unspecified: Secondary | ICD-10-CM

## 2017-06-02 DIAGNOSIS — K5190 Ulcerative colitis, unspecified, without complications: Secondary | ICD-10-CM | POA: Diagnosis not present

## 2017-06-02 DIAGNOSIS — Z720 Tobacco use: Secondary | ICD-10-CM | POA: Diagnosis not present

## 2017-06-02 LAB — CBC
HCT: 31.1 % — ABNORMAL LOW (ref 35.0–47.0)
Hemoglobin: 10.3 g/dL — ABNORMAL LOW (ref 12.0–16.0)
MCH: 30.9 pg (ref 26.0–34.0)
MCHC: 33.3 g/dL (ref 32.0–36.0)
MCV: 92.9 fL (ref 80.0–100.0)
PLATELETS: 279 10*3/uL (ref 150–440)
RBC: 3.34 MIL/uL — AB (ref 3.80–5.20)
RDW: 15.3 % — AB (ref 11.5–14.5)
WBC: 5.1 10*3/uL (ref 3.6–11.0)

## 2017-06-02 MED ORDER — IPRATROPIUM-ALBUTEROL 0.5-2.5 (3) MG/3ML IN SOLN: 3.0000 mL | Freq: Four times a day (QID) | RESPIRATORY_TRACT | Status: DC | PRN

## 2017-06-02 MED ORDER — POLYVINYL ALCOHOL 1.4 % OP SOLN
1.0000 [drp] | Freq: Four times a day (QID) | OPHTHALMIC | Status: DC | PRN
  Administered 2017-06-02 – 2017-06-03 (×2): 1 [drp] via OPHTHALMIC
  Filled 2017-06-02: qty 15

## 2017-06-02 MED ORDER — BUDESONIDE 3 MG PO CPEP
3.0000 mg | ORAL_CAPSULE | Freq: Every day | ORAL | Status: DC
  Administered 2017-06-02 – 2017-06-03 (×2): 3 mg via ORAL
  Filled 2017-06-02 (×2): qty 1

## 2017-06-02 NOTE — Progress Notes (Signed)
[facility] [place] NAME: Lindsey Hobbs    MR#:  [id number]  DATE OF BIRTH:  1927/10/25  SUBJECTIVE:  CHIEF COMPLAINT:   Chief Complaint  Patient presents with  . Rectal Bleeding   -Known history of ulcerative colitis. No further rectal bleeding here. -Slight drop in hemoglobin noted  REVIEW OF SYSTEMS:  Review of Systems  Constitutional: Negative for chills, fever and malaise/fatigue.  HENT: Negative for congestion, ear discharge, hearing loss and nosebleeds.   Eyes: Negative for blurred vision and double vision.  Respiratory: Negative for cough, shortness of breath and wheezing.   Cardiovascular: Negative for chest pain, palpitations and leg swelling.  Gastrointestinal: Positive for blood in stool. Negative for abdominal pain, constipation, diarrhea, nausea and vomiting.  Genitourinary: Negative for dysuria.  Musculoskeletal: Negative for myalgias.  Neurological: Negative for dizziness, speech change, focal weakness, seizures and headaches.  Psychiatric/Behavioral: Negative for depression.    DRUG ALLERGIES:  No Known Allergies  VITALS:  Blood pressure (!) 113/45, pulse 77, temperature (!) 97.5 F (36.4 C), temperature source Oral, resp. rate 18, height 5\' 4"  (1.626 m), weight 53.4 kg (117 lb 12.8 oz), SpO2 92 %.  PHYSICAL EXAMINATION:  Physical Exam  GENERAL:  83 y.o.-year-old Elderly patient lying in the bed with no acute distress.  EYES: Pupils equal, round, reactive to light and accommodation. No scleral icterus. Extraocular muscles intact.  HEENT: Head atraumatic, normocephalic. Oropharynx and nasopharynx clear.  NECK:  Supple, no jugular venous distention. No thyroid enlargement, no tenderness.  LUNGS: Normal breath sounds bilaterally, no wheezing, rales,rhonchi or crepitation. No use of accessory muscles of respiration. Decreased bibasilar breath sounds CARDIOVASCULAR: S1, S2 normal. No rubs, or gallops. 2/6 systolic  murmur is present ABDOMEN: Soft, nontender, nondistended. Bowel sounds present. No organomegaly or mass.  EXTREMITIES: No pedal edema, cyanosis, or clubbing.  NEUROLOGIC: Cranial nerves II through XII are intact. Muscle strength 5/5 in all extremities. Sensation intact. Gait not checked.  PSYCHIATRIC: The patient is alert and oriented x 3.  SKIN: No obvious rash, lesion, or ulcer.    LABORATORY PANEL:   CBC  Recent Labs Lab 06/02/17 0311  WBC 5.1  HGB 10.3*  HCT 31.1*  PLT 279   ------------------------------------------------------------------------------------------------------------------  Chemistries   Recent Labs Lab 06/01/17 1109  NA 138  K 3.8  CL 100*  CO2 32  GLUCOSE 98  BUN 28*  CREATININE 1.07*  CALCIUM 9.5  AST 18  ALT 6*  ALKPHOS 49  BILITOT 0.3   ------------------------------------------------------------------------------------------------------------------  Cardiac Enzymes No results for input(s): TROPONINI in the last 168 hours. ------------------------------------------------------------------------------------------------------------------  RADIOLOGY:  Dg Chest 2 View  Result Date: 06/01/2017 CLINICAL DATA:  Low O2 sats EXAM: CHEST  2 VIEW COMPARISON:  02/28/2011 FINDINGS: There is hyperinflation of the lungs compatible with COPD. Atelectasis in the left mid lung. No confluent opacity on the right. No effusions. Heart is borderline in size. IMPRESSION: COPD.  Left perihilar atelectasis.  Borderline heart size. Electronically Signed   By: Rolm Baptise M.D.   On: 06/01/2017 12:36    EKG:   Orders placed or performed during the hospital encounter of 06/01/17  . EKG 12-Lead  . EKG 12-Lead    ASSESSMENT AND PLAN:   83 year old female with past medical history significant for ulcerative colitis, hypertension, COPD not on home oxygen presents to hospital secondary to worsening bright red blood per rectum.  #1 GI bleed-lower GI bleed with  bright red blood per rectum. -  Known history of ulcerative colitis. Colonoscopy done in 2016 showing friable ulcerative mucosa in the colon. -No further bleeding today. Slight drop in hemoglobin noted. Recheck hemoglobin again later today. -Advance diet. Not sure if she will need a repeat colon study again. -Started on Entocort. Continue mesalamine  #2 COPD- stable, wean o2 as tolerated continue inhalers  #3 peripheral neuropathy-on gabapentin  #4 GERD-on Protonix  #5 DVT prophylaxis-Ted's and SCDs at this time.  Encourage ambulation   All the records are reviewed and case discussed with Care Management/Social Workerr. Management plans discussed with the patient, family and they are in agreement.  CODE STATUS: Full code  TOTAL TIME TAKING CARE OF THIS PATIENT: 37 minutes.   POSSIBLE D/C tomorrow, DEPENDING ON CLINICAL CONDITION.   Brently Voorhis M.D on 06/02/2017 at 9:33 AM  Between 7am to 6pm - Pager - [phone]  After 6pm go to [email or web address] - password [email or web address] [facility]  Office  [phone]  CC: Primary care physician; Cletis Athens, MD

## 2017-06-02 NOTE — Care Management CC44 (Signed)
Condition Code 44 Documentation Completed  Patient Details  Name: Lindsey Hobbs MRN: [id number] Date of Birth: [birth year]   Condition Code 44 given:  Yes Patient signature on Condition Code 44 notice:  Yes Documentation of 2 MD's agreement:  Yes Code 44 added to claim:  Yes    Seaborn Nakama A, RN 06/02/2017, 3:29 PM

## 2017-06-02 NOTE — Consult Note (Addendum)
Reason for Consult:Rectal bleeding Referring Physician: Dr. Haskel Schroeder is an 83 y.o. female.  HPI: Seen in consultation at the request of Dr. Tressia Miners for further evaluation of rectal bleeding. The history is obtained through the patient and review of EPIC.   She was admitted through the ED with several days of BRBPR. She became concerned this this was another ulcerative colitis flare and presented for further evaluation. There has been no abdominal pain, nausea, or vomiting. No extra-GI manifestations of IBD. Her appetite and energy were normal prior to the onset of symptoms. No other associated symptoms. No identified exacerbating or relieving features. She was started on  Prednisone which was converted to budesonide earlier today. She has had no further bleeding today, is tolerating a regular diet, and would like to go home.   She was diagnosed with UC several years. She has been maintained on mesalamine as an outpatient. Last colonoscopy 07/21/15 with Dr. Candace Cruise showed moderately active pancolonic UC. The patient does not remember her symptoms at the time of that flare or prior flares.   She is convinced that her bleeding is related to Crohns and has no interest in any additional endoscopy at this time. She wants to go home.   Past Medical History:  Diagnosis Date  . Anemia   . Avascular necrosis of bone of hip ([place])   . COPD (chronic obstructive pulmonary disease) ([place])   . Guillain Barr syndrome ([place])   . Hyperlipidemia   . Hypertension   . Ulcerative colitis [facility])     Past Surgical History:  Procedure Laterality Date  . ABDOMINAL HYSTERECTOMY    . COLONOSCOPY WITH PROPOFOL Left 07/07/2015   Procedure: COLONOSCOPY WITH PROPOFOL;  Surgeon: Hulen Luster, MD;  Location: [facility] ENDOSCOPY;  Service: Endoscopy;  Laterality: Left;    Family History  Problem Relation Age of Onset  . Breast cancer Mother     Social History:  reports that she has been smoking.  She has been  smoking about 0.50 packs per day. She has never used smokeless tobacco. She reports that she does not drink alcohol. Her drug history is not on file.  Allergies: No Known Allergies  Medications:  I have reviewed the patient's current medications. Prior to Admission:  Prescriptions Prior to Admission  Medication Sig Dispense Refill Last Dose  . Calcium Carbonate-Vitamin D (CALCIUM 600+D) 600-400 MG-UNIT per tablet Take 1 tablet by mouth 2 (two) times daily.   06/01/2017 at am  . clopidogrel (PLAVIX) 75 MG tablet Take 75 mg by mouth daily.   unknown at unknown  . ferrous sulfate 325 (65 FE) MG tablet Take 1 tablet (325 mg total) by mouth 2 (two) times daily with a meal. (Patient taking differently: Take 325 mg by mouth daily with breakfast. ) 180 tablet 0 06/01/2017 at am  . fexofenadine (ALLEGRA) 180 MG tablet Take 180 mg by mouth at bedtime.   1/47/8295 at qhs  . folic acid (FOLVITE) 1 MG tablet Take 1 mg by mouth daily.   06/01/2017 at am  . gabapentin (NEURONTIN) 100 MG capsule Take 100-300 mg by mouth See admin instructions. One in the morning and 2 at night   06/01/2017 at am  . loratadine (CLARITIN) 10 MG tablet Take 10 mg by mouth daily as needed for allergies.   prn at prn  . mesalamine (LIALDA) 1.2 g EC tablet Take 2.4 g by mouth daily with breakfast.   06/01/2017 at am  . Multiple Vitamin (MULTIVITAMIN  WITH MINERALS) TABS tablet Take 1 tablet by mouth daily.   06/01/2017 at am  . Omega-3 Fatty Acids (FISH OIL PO) Take 1 capsule by mouth 3 (three) times daily.   06/01/2017 at am  . pantoprazole (PROTONIX) 40 MG tablet Take 40 mg by mouth daily.   unknown at unknown  . zolpidem (AMBIEN) 10 MG tablet Take 10 mg by mouth at bedtime as needed for sleep.   prn at prn   Scheduled: . budesonide  3 mg Oral Daily  . calcium-vitamin D  1 tablet Oral BID  . ferrous sulfate  325 mg Oral Q breakfast  . folic acid  1 mg Oral Daily  . gabapentin  100 mg Oral q morning - 10a  . gabapentin  200 mg Oral  QHS  . ipratropium-albuterol  3 mL Nebulization Q6H  . loratadine  10 mg Oral Daily  . mesalamine  2.4 g Oral Q breakfast  . multivitamin with minerals  1 tablet Oral Daily  . omega-3 acid ethyl esters  1 g Oral Daily  . pantoprazole  40 mg Oral Daily   Continuous:  RCV:[id number] **OR** acetaminophen, loratadine, polyvinyl alcohol, zolpidem  Results for orders placed or performed during the hospital encounter of 06/01/17 (from the past 48 hour(s))  Comprehensive metabolic panel     Status: Abnormal   Collection Time: 06/01/17 11:09 AM  Result Value Ref Range   Sodium 138 135 - 145 mmol/L   Potassium 3.8 3.5 - 5.1 mmol/L   Chloride 100 (L) 101 - 111 mmol/L   CO2 32 22 - 32 mmol/L   Glucose, Bld 98 65 - 99 mg/dL   BUN 28 (H) 6 - 20 mg/dL   Creatinine, Ser 1.07 (H) 0.44 - 1.00 mg/dL   Calcium 9.5 8.9 - 10.3 mg/dL   Total Protein 6.5 6.5 - 8.1 g/dL   Albumin 3.3 (L) 3.5 - 5.0 g/dL   AST 18 15 - 41 U/L   ALT 6 (L) 14 - 54 U/L   Alkaline Phosphatase 49 38 - 126 U/L   Total Bilirubin 0.3 0.3 - 1.2 mg/dL   GFR calc non Af Amer 45 (L) >60 mL/min   GFR calc Af Amer 53 (L) >60 mL/min    Comment: (NOTE) The eGFR has been calculated using the CKD EPI equation. This calculation has not been validated in all clinical situations. eGFR's persistently <60 mL/min signify possible Chronic Kidney Disease.    Anion gap 6 5 - 15  Lipase, blood     Status: None   Collection Time: 06/01/17 11:09 AM  Result Value Ref Range   Lipase 42 11 - 51 U/L  CBC with Differential     Status: Abnormal   Collection Time: 06/01/17 11:09 AM  Result Value Ref Range   WBC 6.8 3.6 - 11.0 K/uL   RBC 3.57 (L) 3.80 - 5.20 MIL/uL   Hemoglobin 11.2 (L) 12.0 - 16.0 g/dL   HCT 33.2 (L) 35.0 - 47.0 %   MCV 92.9 80.0 - 100.0 fL   MCH 31.2 26.0 - 34.0 pg   MCHC 33.6 32.0 - 36.0 g/dL   RDW 14.9 (H) 11.5 - 14.5 %   Platelets 309 150 - 440 K/uL   Neutrophils Relative % 77 %   Neutro Abs 5.2 1.4 - 6.5 K/uL    Lymphocytes Relative 14 %   Lymphs Abs 0.9 (L) 1.0 - 3.6 K/uL   Monocytes Relative 8 %   Monocytes Absolute 0.5 0.2 -  0.9 K/uL   Eosinophils Relative 1 %   Eosinophils Absolute 0.1 0 - 0.7 K/uL   Basophils Relative 0 %   Basophils Absolute 0.0 0 - 0.1 K/uL  Type and screen [facility]     Status: None   Collection Time: 06/01/17 11:10 AM  Result Value Ref Range   ABO/RH(D) A NEG    Antibody Screen NEG    Sample Expiration 06/04/2017   Protime-INR     Status: None   Collection Time: 06/01/17 12:42 PM  Result Value Ref Range   Prothrombin Time 13.1 11.4 - 15.2 seconds   INR 0.99   Urinalysis, Complete w Microscopic     Status: Abnormal   Collection Time: 06/01/17  1:47 PM  Result Value Ref Range   Color, Urine YELLOW (A) YELLOW   APPearance CLEAR (A) CLEAR   Specific Gravity, Urine 1.008 1.005 - 1.030   pH 6.0 5.0 - 8.0   Glucose, UA NEGATIVE NEGATIVE mg/dL   Hgb urine dipstick NEGATIVE NEGATIVE   Bilirubin Urine NEGATIVE NEGATIVE   Ketones, ur NEGATIVE NEGATIVE mg/dL   Protein, ur NEGATIVE NEGATIVE mg/dL   Nitrite NEGATIVE NEGATIVE   Leukocytes, UA TRACE (A) NEGATIVE   RBC / HPF 0-5 0 - 5 RBC/hpf   WBC, UA 0-5 0 - 5 WBC/hpf   Bacteria, UA NONE SEEN NONE SEEN   Squamous Epithelial / LPF NONE SEEN NONE SEEN  Hemoglobin     Status: Abnormal   Collection Time: 06/01/17  8:31 PM  Result Value Ref Range   Hemoglobin 11.2 (L) 12.0 - 16.0 g/dL  CBC     Status: Abnormal   Collection Time: 06/02/17  3:11 AM  Result Value Ref Range   WBC 5.1 3.6 - 11.0 K/uL   RBC 3.34 (L) 3.80 - 5.20 MIL/uL   Hemoglobin 10.3 (L) 12.0 - 16.0 g/dL   HCT 31.1 (L) 35.0 - 47.0 %   MCV 92.9 80.0 - 100.0 fL   MCH 30.9 26.0 - 34.0 pg   MCHC 33.3 32.0 - 36.0 g/dL   RDW 15.3 (H) 11.5 - 14.5 %   Platelets 279 150 - 440 K/uL    Dg Chest 2 View  Result Date: 06/01/2017 CLINICAL DATA:  Low O2 sats EXAM: CHEST  2 VIEW COMPARISON:  02/28/2011 FINDINGS: There is hyperinflation of the  lungs compatible with COPD. Atelectasis in the left mid lung. No confluent opacity on the right. No effusions. Heart is borderline in size. IMPRESSION: COPD.  Left perihilar atelectasis.  Borderline heart size. Electronically Signed   By: Rolm Baptise M.D.   On: 06/01/2017 12:36    Review of Systems  Constitutional: Negative for chills, fever and weight loss.  HENT: Negative for hearing loss and tinnitus.   Eyes: Negative for blurred vision and double vision.  Respiratory: Negative for cough and hemoptysis.   Cardiovascular: Negative for chest pain and palpitations.  Gastrointestinal: Positive for blood in stool. Negative for abdominal pain, constipation, diarrhea, heartburn, melena, nausea and vomiting.  Genitourinary: Negative for dysuria and urgency.  Musculoskeletal: Negative for back pain, joint pain, myalgias and neck pain.  Skin: Negative for itching and rash.  Neurological: Negative for dizziness and headaches.  Endo/Heme/Allergies: Negative for polydipsia. Does not bruise/bleed easily.  Psychiatric/Behavioral: Negative for depression and suicidal ideas.   Blood pressure (!) 111/47, pulse (!) 103, temperature 99.1 F (37.3 C), temperature source Oral, resp. rate 16, height 5' 4"  (1.626 m), weight 117 lb 12.8 oz (53.4  kg), SpO2 92 %. Physical Exam  Constitutional: She is oriented to person, place, and time. She appears well-developed and well-nourished.  HENT:  Head: Normocephalic and atraumatic.  Mouth/Throat: No oropharyngeal exudate.  Eyes: Conjunctivae are normal. No scleral icterus.  Neck: Neck supple. No thyromegaly present.  Cardiovascular: Normal rate and regular rhythm.   Respiratory: Effort normal and breath sounds normal.  GI: Soft. Bowel sounds are normal.  Musculoskeletal: Normal range of motion. She exhibits no edema.  Lymphadenopathy:       Right: No inguinal adenopathy present.       Left: No inguinal adenopathy present.  No umbilical lymphadenopathy   Neurological: She is alert and oriented to person, place, and time.  Skin: Skin is dry. No rash noted.  Psychiatric: She has a normal mood and affect. Thought content normal.  Rectal: no external hemorrhoids or fissure  Assessment/Plan: Pancolonic UC - stable on mesalamine since 2016 BRBPR Slight decline in hemoglobin from 11.2 to 10.3 Normal platelets and albumin  Possible UC flare - although unusual to have rectal bleeding without any other symptoms. Differential also includes outlet bleeding and less likely infectious colitis, ischemic colitis, polyp or mass. The bleeding has stopped with empiric steroids. The patient does not want to pursue endoscopy at this time.  Budesonide is unlikely to control her pancolonic UC. However, the Cortiment formulation of budesonide would be helpful and a great alternative to systemic steroids if it is available. I recommend a short trial of prednisone 40 mg daily in additional to her daily Lialda as well as close follow-up with the GI team at the [facility] to facilitate a short taper if Cortiment is not available.  Thank you for allowing me to participate in Mrs. Macha's care. Dr. Vicente Males will see the patient tomorrow if needed. Otherwise, please call with any questions or concerns.    [place] 06/02/2017, 4:37 PM

## 2017-06-02 NOTE — Care Management Obs Status (Signed)
Energy NOTIFICATION   Patient Details  Name: CELSEY ASSELIN MRN: [id number] Date of Birth: 12/01/1926   Medicare Observation Status Notification Given:  Yes    Leshay Desaulniers A, RN 06/02/2017, 3:28 PM

## 2017-06-03 DIAGNOSIS — K5190 Ulcerative colitis, unspecified, without complications: Secondary | ICD-10-CM | POA: Diagnosis not present

## 2017-06-03 DIAGNOSIS — Z720 Tobacco use: Secondary | ICD-10-CM | POA: Diagnosis not present

## 2017-06-03 DIAGNOSIS — K922 Gastrointestinal hemorrhage, unspecified: Secondary | ICD-10-CM | POA: Diagnosis not present

## 2017-06-03 DIAGNOSIS — J449 Chronic obstructive pulmonary disease, unspecified: Secondary | ICD-10-CM | POA: Diagnosis not present

## 2017-06-03 DIAGNOSIS — K625 Hemorrhage of anus and rectum: Secondary | ICD-10-CM

## 2017-06-03 LAB — CBC
HEMATOCRIT: 25.2 % — AB (ref 35.0–47.0)
HEMOGLOBIN: 8.3 g/dL — AB (ref 12.0–16.0)
MCH: 30.7 pg (ref 26.0–34.0)
MCHC: 32.8 g/dL (ref 32.0–36.0)
MCV: 93.7 fL (ref 80.0–100.0)
Platelets: 280 10*3/uL (ref 150–440)
RBC: 2.69 MIL/uL — ABNORMAL LOW (ref 3.80–5.20)
RDW: 15.5 % — ABNORMAL HIGH (ref 11.5–14.5)
WBC: 6.6 10*3/uL (ref 3.6–11.0)

## 2017-06-03 LAB — C-REACTIVE PROTEIN: CRP: 1 mg/dL — AB (ref <1.0)

## 2017-06-03 LAB — HEMOGLOBIN
HEMOGLOBIN: 9.2 g/dL — AB (ref 12.0–16.0)
Hemoglobin: 9.3 g/dL — ABNORMAL LOW (ref 12.0–16.0)

## 2017-06-03 MED ORDER — PREDNISONE 20 MG PO TABS
40.0000 mg | ORAL_TABLET | Freq: Every day | ORAL | Status: DC
  Administered 2017-06-03 – 2017-06-04 (×2): 40 mg via ORAL
  Filled 2017-06-03 (×2): qty 2

## 2017-06-03 MED ORDER — MESALAMINE 4 G RE ENEM
4.0000 g | ENEMA | Freq: Every day | RECTAL | Status: DC
  Administered 2017-06-03 – 2017-06-04 (×2): 4 g via RECTAL
  Filled 2017-06-03 (×3): qty 60

## 2017-06-03 NOTE — Progress Notes (Signed)
Jonathon Bellows MD, MRCP(U.K) [address]  [place], [place] [address]  Main: [phone]    Lindsey Hobbs is being followed for ulcerative colitis  Day 1 of follow up   Subjective:   Overnight had 1-2 bloody bowel movements , no abdominal pain , denies any NSAID use  Objective: Vital signs in last 24 hours:  Vitals:   06/02/17 1940 06/02/17 2051 06/02/17 2053 06/03/17 0537  BP:  (!) 129/39 (!) 128/49 (!) 104/49  Pulse:  (!) 101 89 80  Resp:  20  18  Temp:  98.6 F (37 C)  97.9 F (36.6 C)  TempSrc:  Oral  Oral  SpO2: 94% 96%  99%  Weight:      Height:       Weight change:   Intake/Output Summary (Last 24 hours) at 06/03/17 1105 Last data filed at 06/03/17 0537  Gross per 24 hour  Intake              240 ml  Output              150 ml  Net               90 ml     Exam: Heart:: Regular rate and rhythm, S1S2 present or without murmur or extra heart sounds Lungs: normal, clear to auscultation and clear to auscultation and percussion Abdomen: soft, nontender, normal bowel sounds   Lab Results: CBC Latest Ref Rng & Units 06/03/2017 06/02/2017 06/01/2017  WBC 3.6 - 11.0 K/uL 6.6 5.1 -  Hemoglobin 12.0 - 16.0 g/dL 8.3(L) 10.3(L) 11.2(L)  Hematocrit 35.0 - 47.0 % 25.2(L) 31.1(L) -  Platelets 150 - 440 K/uL 280 279 -    Micro Results: No results found for this or any previous visit (from the past 240 hour(s)). Studies/Results: Dg Chest 2 View  Result Date: 06/01/2017 CLINICAL DATA:  Low O2 sats EXAM: CHEST  2 VIEW COMPARISON:  02/28/2011 FINDINGS: There is hyperinflation of the lungs compatible with COPD. Atelectasis in the left mid lung. No confluent opacity on the right. No effusions. Heart is borderline in size. IMPRESSION: COPD.  Left perihilar atelectasis.  Borderline heart size. Electronically Signed   By: Rolm Baptise M.D.   On: 06/01/2017 12:36   Medications: I have reviewed the patient's current medications. Scheduled Meds: . budesonide  3  mg Oral Daily  . calcium-vitamin D  1 tablet Oral BID  . ferrous sulfate  325 mg Oral Q breakfast  . folic acid  1 mg Oral Daily  . gabapentin  100 mg Oral q morning - 10a  . gabapentin  200 mg Oral QHS  . loratadine  10 mg Oral Daily  . mesalamine  2.4 g Oral Q breakfast  . multivitamin with minerals  1 tablet Oral Daily  . omega-3 acid ethyl esters  1 g Oral Daily  . pantoprazole  40 mg Oral Daily   Continuous Infusions: PRN Meds:.acetaminophen **OR** acetaminophen, ipratropium-albuterol, loratadine, polyvinyl alcohol, zolpidem   Assessment: Active Problems:   GI bleed   Rectal bleed  Lindsey Hobbs 83 y.o. female admitted with rectal bleeding with a history of prior ulcerative colitis who has been on ASA as an outpatient . Commenced on prednisone 40 mg yesterday , overnight 2 grams drop in hemoglobin   Plan: 1. Although I see a plan to commence on prednisone yesterday  , I do not see prednisone on her MAR.  2. If has diarrhea  would need to r/o C diff and obtain a GI stool PCR- presently denies diarrhea  3. Avoid any NSAID's 4. If no better on steroids after 24-48 or worsening of symptoms then would need a flexible sigmoidoscopy and if still does not improve in 72-96 hours will need to commence on Infliximab as an inpatient.  5. Add rowasa enema 6. No contraindication to Lovenox prophlactic dose which actually helps reduce the GI bleed by preventing microthrombosis which occurs in the setting of UC.  7. Obtain baseline CRP   LOS: 1 day   Roniesha Hollingshead 06/03/2017, 11:05 AM

## 2017-06-03 NOTE — Progress Notes (Signed)
[place] [facility] [place] NAME: Lindsey Hobbs    MR#:  [id number]  DATE OF BIRTH:  06/20/1927  SUBJECTIVE:  CHIEF COMPLAINT:   Chief Complaint  Patient presents with  . Rectal Bleeding   -had an episode of bloody stool last night. Hemoglobin dropped 2 points this morning. -Denies any further diarrhea today. No fevers or chills. No abdominal pain  REVIEW OF SYSTEMS:  Review of Systems  Constitutional: Negative for chills, fever and malaise/fatigue.  HENT: Negative for congestion, ear discharge, hearing loss and nosebleeds.   Eyes: Negative for blurred vision and double vision.  Respiratory: Negative for cough, shortness of breath and wheezing.   Cardiovascular: Negative for chest pain, palpitations and leg swelling.  Gastrointestinal: Positive for blood in stool. Negative for abdominal pain, constipation, diarrhea, nausea and vomiting.  Genitourinary: Negative for dysuria.  Musculoskeletal: Negative for myalgias.  Neurological: Negative for dizziness, speech change, focal weakness, seizures and headaches.  Psychiatric/Behavioral: Negative for depression.    DRUG ALLERGIES:  No Known Allergies  VITALS:  Blood pressure (!) 104/49, pulse 80, temperature 97.9 F (36.6 C), temperature source Oral, resp. rate 18, height 5\' 4"  (1.626 m), weight 53.4 kg (117 lb 12.8 oz), SpO2 99 %.  PHYSICAL EXAMINATION:  Physical Exam  GENERAL:  83 y.o.-year-old Elderly patient lying in the bed with no acute distress.  EYES: Pupils equal, round, reactive to light and accommodation. No scleral icterus. Extraocular muscles intact.  HEENT: Head atraumatic, normocephalic. Oropharynx and nasopharynx clear.  NECK:  Supple, no jugular venous distention. No thyroid enlargement, no tenderness.  LUNGS: Normal breath sounds bilaterally, no wheezing, rales,rhonchi or crepitation. No use of accessory muscles of respiration. Decreased bibasilar breath  sounds CARDIOVASCULAR: S1, S2 normal. No rubs, or gallops. 2/6 systolic murmur is present ABDOMEN: Soft, nontender, nondistended. Bowel sounds present. No organomegaly or mass.  EXTREMITIES: No pedal edema, cyanosis, or clubbing.  NEUROLOGIC: Cranial nerves II through XII are intact. Muscle strength 5/5 in all extremities. Sensation intact. Gait not checked.  PSYCHIATRIC: The patient is alert and oriented x 3.  SKIN: No obvious rash, lesion, or ulcer.    LABORATORY PANEL:   CBC  Recent Labs Lab 06/03/17 0329  WBC 6.6  HGB 8.3*  HCT 25.2*  PLT 280   ------------------------------------------------------------------------------------------------------------------  Chemistries   Recent Labs Lab 06/01/17 1109  NA 138  K 3.8  CL 100*  CO2 32  GLUCOSE 98  BUN 28*  CREATININE 1.07*  CALCIUM 9.5  AST 18  ALT 6*  ALKPHOS 49  BILITOT 0.3   ------------------------------------------------------------------------------------------------------------------  Cardiac Enzymes No results for input(s): TROPONINI in the last 168 hours. ------------------------------------------------------------------------------------------------------------------  RADIOLOGY:  Dg Chest 2 View  Result Date: 06/01/2017 CLINICAL DATA:  Low O2 sats EXAM: CHEST  2 VIEW COMPARISON:  02/28/2011 FINDINGS: There is hyperinflation of the lungs compatible with COPD. Atelectasis in the left mid lung. No confluent opacity on the right. No effusions. Heart is borderline in size. IMPRESSION: COPD.  Left perihilar atelectasis.  Borderline heart size. Electronically Signed   By: Rolm Baptise M.D.   On: 06/01/2017 12:36    EKG:   Orders placed or performed during the hospital encounter of 06/01/17  . EKG 12-Lead  . EKG 12-Lead    ASSESSMENT AND PLAN:   83 year old female with past medical history significant for ulcerative colitis, hypertension, COPD not on home oxygen presents to hospital secondary to  worsening bright red blood per rectum.  #1  GI bleed-lower GI bleed with bright red blood per rectum. -Known history of ulcerative colitis.  -Colonoscopy done in 2016 showing friable ulcerative mucosa in the colon. -bleeding last night, hb drop noted- recheck every 6-8hrs -Appreciate GI consult. If hemoglobin drops less than 7, will need transfusion. -If has diarrhea, check C. difficile. Further rectal bleed-will need GI bleeding scan. -Monitor to see if she needs any sigmoidoscopy tomorrow. -On prednisone orally and also mesalamine. -Started on clear liquids today  #2 COPD- stable, wean o2 as tolerated continue inhalers  #3 peripheral neuropathy-on gabapentin  #4 GERD-on Protonix  #5 DVT prophylaxis-Ted's and SCDs at this time.  Encourage ambulation   All the records are reviewed and case discussed with Care Management/Social Workerr. Management plans discussed with the patient, family and they are in agreement.  CODE STATUS: Full code  TOTAL TIME TAKING CARE OF THIS PATIENT: 37 minutes.   POSSIBLE D/C in 1-2 days, DEPENDING ON CLINICAL CONDITION.   Roux Brandy M.D on 06/03/2017 at 12:09 PM  Between 7am to 6pm - Pager - [phone]  After 6pm go to [email or web address] - password [email or web address] [facility]  Office  [phone]  CC: Primary care physician; Cletis Athens, MD

## 2017-06-04 ENCOUNTER — Ambulatory Visit: Payer: Medicare Other | Admitting: Internal Medicine

## 2017-06-04 DIAGNOSIS — F17210 Nicotine dependence, cigarettes, uncomplicated: Secondary | ICD-10-CM | POA: Diagnosis present

## 2017-06-04 DIAGNOSIS — M87859 Other osteonecrosis, unspecified femur: Secondary | ICD-10-CM | POA: Diagnosis present

## 2017-06-04 DIAGNOSIS — K922 Gastrointestinal hemorrhage, unspecified: Secondary | ICD-10-CM | POA: Diagnosis not present

## 2017-06-04 DIAGNOSIS — Z90710 Acquired absence of both cervix and uterus: Secondary | ICD-10-CM | POA: Diagnosis not present

## 2017-06-04 DIAGNOSIS — Z720 Tobacco use: Secondary | ICD-10-CM | POA: Diagnosis not present

## 2017-06-04 DIAGNOSIS — D649 Anemia, unspecified: Secondary | ICD-10-CM | POA: Diagnosis present

## 2017-06-04 DIAGNOSIS — R05 Cough: Secondary | ICD-10-CM | POA: Diagnosis not present

## 2017-06-04 DIAGNOSIS — Z7902 Long term (current) use of antithrombotics/antiplatelets: Secondary | ICD-10-CM | POA: Diagnosis not present

## 2017-06-04 DIAGNOSIS — K56699 Other intestinal obstruction unspecified as to partial versus complete obstruction: Secondary | ICD-10-CM | POA: Diagnosis not present

## 2017-06-04 DIAGNOSIS — K219 Gastro-esophageal reflux disease without esophagitis: Secondary | ICD-10-CM | POA: Diagnosis present

## 2017-06-04 DIAGNOSIS — K5090 Crohn's disease, unspecified, without complications: Secondary | ICD-10-CM | POA: Diagnosis present

## 2017-06-04 DIAGNOSIS — Z716 Tobacco abuse counseling: Secondary | ICD-10-CM | POA: Diagnosis not present

## 2017-06-04 DIAGNOSIS — J441 Chronic obstructive pulmonary disease with (acute) exacerbation: Secondary | ICD-10-CM | POA: Diagnosis present

## 2017-06-04 DIAGNOSIS — G629 Polyneuropathy, unspecified: Secondary | ICD-10-CM | POA: Diagnosis present

## 2017-06-04 DIAGNOSIS — Z79899 Other long term (current) drug therapy: Secondary | ICD-10-CM | POA: Diagnosis not present

## 2017-06-04 DIAGNOSIS — K633 Ulcer of intestine: Secondary | ICD-10-CM | POA: Diagnosis not present

## 2017-06-04 DIAGNOSIS — J449 Chronic obstructive pulmonary disease, unspecified: Secondary | ICD-10-CM | POA: Diagnosis not present

## 2017-06-04 DIAGNOSIS — K625 Hemorrhage of anus and rectum: Secondary | ICD-10-CM | POA: Diagnosis not present

## 2017-06-04 DIAGNOSIS — I10 Essential (primary) hypertension: Secondary | ICD-10-CM | POA: Diagnosis present

## 2017-06-04 DIAGNOSIS — Z66 Do not resuscitate: Secondary | ICD-10-CM | POA: Diagnosis present

## 2017-06-04 DIAGNOSIS — K5190 Ulcerative colitis, unspecified, without complications: Secondary | ICD-10-CM | POA: Diagnosis not present

## 2017-06-04 DIAGNOSIS — E785 Hyperlipidemia, unspecified: Secondary | ICD-10-CM | POA: Diagnosis present

## 2017-06-04 LAB — CBC
HEMATOCRIT: 25.2 % — AB (ref 35.0–47.0)
HEMOGLOBIN: 8.2 g/dL — AB (ref 12.0–16.0)
MCH: 30.4 pg (ref 26.0–34.0)
MCHC: 32.6 g/dL (ref 32.0–36.0)
MCV: 93.4 fL (ref 80.0–100.0)
Platelets: 288 10*3/uL (ref 150–440)
RBC: 2.7 MIL/uL — AB (ref 3.80–5.20)
RDW: 15.3 % — ABNORMAL HIGH (ref 11.5–14.5)
WBC: 5.2 10*3/uL (ref 3.6–11.0)

## 2017-06-04 LAB — HEMOGLOBIN: HEMOGLOBIN: 8.4 g/dL — AB (ref 12.0–16.0)

## 2017-06-04 MED ORDER — NICOTINE 21 MG/24HR TD PT24
21.0000 mg | MEDICATED_PATCH | Freq: Every day | TRANSDERMAL | Status: DC
  Administered 2017-06-04 – 2017-06-06 (×3): 21 mg via TRANSDERMAL
  Filled 2017-06-04 (×3): qty 1

## 2017-06-04 MED ORDER — HYDROCORTISONE NA SUCCINATE PF 100 MG IJ SOLR
100.0000 mg | Freq: Three times a day (TID) | INTRAMUSCULAR | Status: DC
  Administered 2017-06-04 – 2017-06-06 (×6): 100 mg via INTRAVENOUS
  Filled 2017-06-04 (×6): qty 2

## 2017-06-04 MED ORDER — SODIUM CHLORIDE 0.9 % IV SOLN
INTRAVENOUS | Status: DC
  Administered 2017-06-04 – 2017-06-05 (×2): via INTRAVENOUS

## 2017-06-04 MED ORDER — IPRATROPIUM-ALBUTEROL 0.5-2.5 (3) MG/3ML IN SOLN
3.0000 mL | Freq: Once | RESPIRATORY_TRACT | Status: AC
  Administered 2017-06-04: 3 mL via RESPIRATORY_TRACT
  Filled 2017-06-04: qty 3

## 2017-06-04 MED ORDER — ENOXAPARIN SODIUM 40 MG/0.4ML ~~LOC~~ SOLN
40.0000 mg | SUBCUTANEOUS | Status: DC
  Administered 2017-06-04: 40 mg via SUBCUTANEOUS
  Filled 2017-06-04 (×2): qty 0.4

## 2017-06-04 MED ORDER — MOMETASONE FURO-FORMOTEROL FUM 200-5 MCG/ACT IN AERO
2.0000 | INHALATION_SPRAY | Freq: Two times a day (BID) | RESPIRATORY_TRACT | Status: DC
  Administered 2017-06-04 – 2017-06-06 (×5): 2 via RESPIRATORY_TRACT
  Filled 2017-06-04: qty 8.8

## 2017-06-04 MED ORDER — FLEET ENEMA 7-19 GM/118ML RE ENEM
1.0000 | ENEMA | Freq: Once | RECTAL | Status: AC
  Administered 2017-06-04: 1 via RECTAL

## 2017-06-04 NOTE — Progress Notes (Signed)
SATURATION QUALIFICATIONS: (This note is used to comply with regulatory documentation for home oxygen)  Patient Saturations on Room Air at Rest = 94%  Patient Saturations on Room Air while Ambulating = 87%  Patient Saturations on 2 Liters of oxygen while Ambulating = 93%  Please briefly explain why patient needs home oxygen: 

## 2017-06-04 NOTE — Progress Notes (Signed)
[facility] [place] NAME: Lindsey Hobbs    MR#:  [id number]  DATE OF BIRTH:  1927/07/31  SUBJECTIVE:  CHIEF COMPLAINT:   Chief Complaint  Patient presents with  . Rectal Bleeding   - bloody stools- twice again last night - feels about the same, slightly dyspneic - hb at 8.2  REVIEW OF SYSTEMS:  Review of Systems  Constitutional: Positive for malaise/fatigue. Negative for chills and fever.  HENT: Negative for congestion, ear discharge, hearing loss and nosebleeds.   Eyes: Negative for blurred vision and double vision.  Respiratory: Positive for shortness of breath. Negative for cough and wheezing.   Cardiovascular: Negative for chest pain, palpitations and leg swelling.  Gastrointestinal: Positive for blood in stool. Negative for abdominal pain, constipation, diarrhea, nausea and vomiting.  Genitourinary: Negative for dysuria.  Musculoskeletal: Negative for myalgias.  Neurological: Negative for dizziness, speech change, focal weakness, seizures and headaches.  Psychiatric/Behavioral: Negative for depression.    DRUG ALLERGIES:  No Known Allergies  VITALS:  Blood pressure (!) 111/41, pulse 76, temperature 98.8 F (37.1 C), resp. rate 18, height 5\' 4"  (1.626 m), weight 53.4 kg (117 lb 12.8 oz), SpO2 93 %.  PHYSICAL EXAMINATION:  Physical Exam  GENERAL:  83 y.o.-year-old Elderly patient lying in the bed with no acute distress.  EYES: Pupils equal, round, reactive to light and accommodation. No scleral icterus. Extraocular muscles intact.  HEENT: Head atraumatic, normocephalic. Oropharynx and nasopharynx clear.  NECK:  Supple, no jugular venous distention. No thyroid enlargement, no tenderness.  LUNGS: decreased bibasilar breath sounds, scattered end inspiratory wheezing, no rales,rhonchi or crepitation. No use of accessory muscles of respiration. Decreased bibasilar breath sounds CARDIOVASCULAR: S1, S2 normal. No rubs, or gallops.  2/6 systolic murmur is present ABDOMEN: Soft, nontender, nondistended. Bowel sounds present. No organomegaly or mass.  EXTREMITIES: No pedal edema, cyanosis, or clubbing.  NEUROLOGIC: Cranial nerves II through XII are intact. Muscle strength 5/5 in all extremities. Sensation intact. Gait not checked.  PSYCHIATRIC: The patient is alert and oriented x 3.  SKIN: No obvious rash, lesion, or ulcer.    LABORATORY PANEL:   CBC  Recent Labs Lab 06/04/17 0413  WBC 5.2  HGB 8.2*  HCT 25.2*  PLT 288   ------------------------------------------------------------------------------------------------------------------  Chemistries   Recent Labs Lab 06/01/17 1109  NA 138  K 3.8  CL 100*  CO2 32  GLUCOSE 98  BUN 28*  CREATININE 1.07*  CALCIUM 9.5  AST 18  ALT 6*  ALKPHOS 49  BILITOT 0.3   ------------------------------------------------------------------------------------------------------------------  Cardiac Enzymes No results for input(s): TROPONINI in the last 168 hours. ------------------------------------------------------------------------------------------------------------------  RADIOLOGY:  No results found.  EKG:   Orders placed or performed during the hospital encounter of 06/01/17  . EKG 12-Lead  . EKG 12-Lead    ASSESSMENT AND PLAN:   83 year old female with past medical history significant for ulcerative colitis, hypertension, COPD not on home oxygen presents to hospital secondary to worsening bright red blood per rectum.  #1 GI bleed-lower GI bleed with bright red blood per rectum. secondary to colitis flare -Known history of ulcerative colitis.  -Colonoscopy done in 2016 showing friable ulcerative mucosa in the colon. -continues to have bloody stools and hb dropped to 8.2 -Appreciate GI consult. If hemoglobin drops less than 7, will need transfusion. -If has diarrhea, check C. Difficile. - for flexible sigmoidoscopy tomorrow. -On prednisone orally  and mesalamine enema- will be changed to IV steroids by GI -  on clear liquids today  #2 COPD- scattered wheeze today, anyways on steroids. Add nebs -, wean o2 as tolerated, continues to smoke as outpatient, add nicotine patch continue inhalers  #3 peripheral neuropathy-on gabapentin  #4 GERD-on Protonix  #5 DVT prophylaxis-lovenox added per GI recommendations  Encourage ambulation Check ambulatory sats   All the records are reviewed and case discussed with Care Management/Social Workerr. Management plans discussed with the patient, family and they are in agreement.  CODE STATUS: DNR  TOTAL TIME TAKING CARE OF THIS PATIENT: 38 minutes.   POSSIBLE D/C in 1-2 days, DEPENDING ON CLINICAL CONDITION.   Gladstone Lighter M.D on 06/04/2017 at 10:12 AM  Between 7am to 6pm - Pager - [phone]  After 6pm go to [email or web address] - password [email or web address] [facility]  Office  [phone]  CC: Primary care physician; Cletis Athens, MD

## 2017-06-04 NOTE — Evaluation (Signed)
Physical Therapy Evaluation Patient Details Name: Lindsey Hobbs MRN: [id number] DOB: 1927/05/02 Today's Date: 06/04/2017   History of Present Illness  Patient is an 83 y/o female that presents with BRBPR, has also had some dyspnea and shortness of breath.   Clinical Impression  Patient is a pleasant 83 y/o female that presents with generalized weakness after coming to ED with BRBPR. She is noted to have O2 sats of 87% on room air at baseline, increased to 93% at rest with 2L of O2. She is noted to require furniture "cruising" or use of contralateral UE while attempting to ambulate in room with [facility], however appropriate gait speed and mechanics with RW once PT opted to switch. No decrease in O2 sats during ambulation 94% afterwards. No loss of balance noted, though given her physical deconditioning she would benefit from HHPT and use of RW at home as discussed with patient.     Follow Up Recommendations Home health PT    Equipment Recommendations  Rolling walker with 5" wheels    Recommendations for Other Services       Precautions / Restrictions Precautions Precautions: Fall Restrictions Weight Bearing Restrictions: No      Mobility  Bed Mobility Overal bed mobility: Independent             General bed mobility comments: No assistance required for transitions.   Transfers Overall transfer level: Modified independent Equipment used: Straight cane             General transfer comment: Patient requires anterior trunk lean and prolonged time to complete, but no loss of balance noted.   Ambulation/Gait Ambulation/Gait assistance: Min guard Ambulation Distance (Feet): 200 Feet Assistive device: Rolling walker (2 wheeled) Gait Pattern/deviations: WFL(Within Functional Limits) Gait velocity: Gait was likely at her baseline velocity.  Gait velocity interpretation: <1.8 ft/sec, indicative of risk for recurrent falls General Gait Details: noted to have slight anterior  trunk lean, appropriate use of RW. With cane only she was grabbing for railings and solid surfaces for assistance.   Stairs            Wheelchair Mobility    Modified Rankin (Stroke Patients Only)       Balance Overall balance assessment: Needs assistance Sitting-balance support: No upper extremity supported Sitting balance-Leahy Scale: Good     Standing balance support: Bilateral upper extremity supported Standing balance-Leahy Scale: Good                               Pertinent Vitals/Pain Pain Assessment: No/denies pain    Home Living Family/patient expects to be discharged to:: Private residence Living Arrangements: Alone Available Help at Discharge: Available PRN/intermittently Type of Home: House Home Access: Stairs to enter Entrance Stairs-Rails:  (Reports she has rails but does not specify which side(s)) Entrance Stairs-Number of Steps: Reports 6-7 steps into condo and 12 steps to upstairs  Home Layout: Two level Home Equipment: Walker - 2 wheels;Cane - single point Additional Comments: Patient reports no difficulty with ascending steps, denies any falls.     Prior Function Level of Independence: Independent with assistive device(s)         Comments: Patient reports she typically uses a SPC and has not had any falls at home.      Hand Dominance        Extremity/Trunk Assessment   Upper Extremity Assessment Upper Extremity Assessment: Overall WFL for tasks assessed    Lower  Extremity Assessment Lower Extremity Assessment: Overall WFL for tasks assessed       Communication   Communication: No difficulties  Cognition Arousal/Alertness: Awake/alert Behavior During Therapy: WFL for tasks assessed/performed Overall Cognitive Status: Within Functional Limits for tasks assessed                                        General Comments      Exercises     Assessment/Plan    PT Assessment Patient needs continued  PT services  PT Problem List Decreased strength;Decreased mobility;Decreased activity tolerance;Cardiopulmonary status limiting activity;Decreased balance       PT Treatment Interventions      PT Goals (Current goals can be found in the Care Plan section)  Acute Rehab PT Goals Patient Stated Goal: To return home safely  PT Goal Formulation: With patient Time For Goal Achievement: 06/18/17 Potential to Achieve Goals: Good    Frequency Min 2X/week   Barriers to discharge Decreased caregiver support      Co-evaluation               AM-PAC PT "6 Clicks" Daily Activity  Outcome Measure Difficulty turning over in bed (including adjusting bedclothes, sheets and blankets)?: None Difficulty moving from lying on back to sitting on the side of the bed? : None Difficulty sitting down on and standing up from a chair with arms (e.g., wheelchair, bedside commode, etc,.)?: A Little Help needed moving to and from a bed to chair (including a wheelchair)?: A Little Help needed walking in hospital room?: A Little Help needed climbing 3-5 steps with a railing? : A Little 6 Click Score: 20    End of Session Equipment Utilized During Treatment: Gait belt;Oxygen Activity Tolerance: Patient tolerated treatment well Patient left: in bed;with bed alarm set;with call bell/phone within reach Nurse Communication: Mobility status (O2 sats) PT Visit Diagnosis: Muscle weakness (generalized) (M62.81);Difficulty in walking, not elsewhere classified (R26.2)    Time: 2202-5427 PT Time Calculation (min) (ACUTE ONLY): 16 min   Charges:   PT Evaluation $PT Eval Moderate Complexity: 1 Mod     PT G Codes:   PT G-Codes **NOT FOR INPATIENT CLASS** Functional Assessment Tool Used: AM-PAC 6 Clicks Basic Mobility Functional Limitation: Mobility: Walking and moving around Mobility: Walking and Moving Around Current Status (C6237): At least 1 percent but less than 20 percent impaired, limited or  restricted Mobility: Walking and Moving Around Goal Status [phone]): At least 1 percent but less than 20 percent impaired, limited or restricted    Royce Macadamia PT, DPT, CSCS    06/04/2017, 11:01 AM

## 2017-06-04 NOTE — Progress Notes (Signed)
Jonathon Bellows MD, MRCP(U.K) [place]  [place], [place] [address]  Main: [phone]    Lindsey Hobbs is being followed for colitis    Subjective: Had further bloody bowel movements yesterday    Objective: Vital signs in last 24 hours: Vitals:   06/03/17 2022 06/03/17 2257 06/04/17 0429 06/04/17 0510  BP: (!) 126/110 (!) 126/43 (!) 113/35 (!) 111/41  Pulse: 76 86 81 75  Resp: 20  18   Temp: 98.4 F (36.9 C)  98.8 F (37.1 C)   TempSrc: Oral     SpO2: 93%  93%   Weight:      Height:       Weight change:   Intake/Output Summary (Last 24 hours) at 06/04/17 0845 Last data filed at 06/03/17 1300  Gross per 24 hour  Intake              300 ml  Output                0 ml  Net              300 ml     Exam: Heart:: Regular rate and rhythm, S1S2 present or without murmur or extra heart sounds Lungs: normal, clear to auscultation and clear to auscultation and percussion Abdomen: soft, nontender, normal bowel sounds   Lab Results: CBC Latest Ref Rng & Units 06/04/2017 06/03/2017 06/03/2017  WBC 3.6 - 11.0 K/uL 5.2 - -  Hemoglobin 12.0 - 16.0 g/dL 8.2(L) 9.2(L) 9.3(L)  Hematocrit 35.0 - 47.0 % 25.2(L) - -  Platelets 150 - 440 K/uL 288 - -   CMP Latest Ref Rng & Units 06/01/2017 07/06/2015 07/05/2015  Glucose 65 - 99 mg/dL 98 100(H) 111(H)  BUN 6 - 20 mg/dL 28(H) 21(H) 32(H)  Creatinine 0.44 - 1.00 mg/dL 1.07(H) 0.91 1.09(H)  Sodium 135 - 145 mmol/L 138 146(H) 145  Potassium 3.5 - 5.1 mmol/L 3.8 3.8 4.2  Chloride 101 - 111 mmol/L 100(L) 108 103  CO2 22 - 32 mmol/L 32 31 33(H)  Calcium 8.9 - 10.3 mg/dL 9.5 9.3 10.4(H)  Total Protein 6.5 - 8.1 g/dL 6.5 - 6.6  Total Bilirubin 0.3 - 1.2 mg/dL 0.3 - 0.4  Alkaline Phos 38 - 126 U/L 49 - 55  AST 15 - 41 U/L 18 - 16  ALT 14 - 54 U/L 6(L) - 8(L)    Micro Results: No results found for this or any previous visit (from the past 240 hour(s)). Studies/Results: No results found. Medications: I have reviewed  the patient's current medications. Scheduled Meds: . calcium-vitamin D  1 tablet Oral BID  . enoxaparin (LOVENOX) injection  40 mg Subcutaneous Q24H  . ferrous sulfate  325 mg Oral Q breakfast  . folic acid  1 mg Oral Daily  . gabapentin  100 mg Oral q morning - 10a  . gabapentin  200 mg Oral QHS  . loratadine  10 mg Oral Daily  . mesalamine  4 g Rectal QHS  . multivitamin with minerals  1 tablet Oral Daily  . omega-3 acid ethyl esters  1 g Oral Daily  . pantoprazole  40 mg Oral Daily  . predniSONE  40 mg Oral Daily   Continuous Infusions: PRN Meds:.acetaminophen **OR** acetaminophen, ipratropium-albuterol, polyvinyl alcohol, zolpidem   Assessment: Active Problems:   GI bleed   Rectal bleed  Lindsey Hobbs 83 y.o. female admitted with rectal bleeding with a history of prior ulcerative colitis who  has been on ASA as an outpatient . Commenced on prednisone 40 mg yesterday , still having rectal bleeding   Plan: 1. Change from prednisone to hydrocortisone 100 mg TID  2. If has diarrhea would need to r/o C diff and obtain a GI stool PCR- presently denies diarrhea  3. Avoid any NSAID's 4. Flexible sigmoidoscopy tomorrow   5. Continue  rowasa enema  I have discussed alternative options, risks & benefits,  which include, but are not limited to, bleeding, infection, perforation,respiratory complication & drug reaction.  The patient agrees with this plan & written consent will be obtained.       LOS: 1 day   Devanee Hobbs 06/04/2017, 8:45 AM

## 2017-06-04 NOTE — Progress Notes (Signed)
SATURATION QUALIFICATIONS: (This note is used to comply with regulatory documentation for home oxygen)  Patient Saturations on Room Air at Rest = 87%  Patient Saturations on Room Air while Ambulating = %  Patient Saturations on  Liters of oxygen while Ambulating = %  Please briefly explain why patient needs home oxygen: 

## 2017-06-05 ENCOUNTER — Inpatient Hospital Stay: Payer: Medicare Other

## 2017-06-05 ENCOUNTER — Encounter
Admission: EM | Disposition: A | Discharge status: Home Health | Payer: Self-pay | Source: Home / Self Care | Attending: Internal Medicine

## 2017-06-05 ENCOUNTER — Encounter: Payer: Self-pay | Admitting: *Deleted

## 2017-06-05 DIAGNOSIS — K56699 Other intestinal obstruction unspecified as to partial versus complete obstruction: Secondary | ICD-10-CM

## 2017-06-05 DIAGNOSIS — I82409 Acute embolism and thrombosis of unspecified deep veins of unspecified lower extremity: Secondary | ICD-10-CM

## 2017-06-05 DIAGNOSIS — K633 Ulcer of intestine: Secondary | ICD-10-CM

## 2017-06-05 HISTORY — PX: FLEXIBLE SIGMOIDOSCOPY: SHX5431

## 2017-06-05 HISTORY — DX: Acute embolism and thrombosis of unspecified deep veins of unspecified lower extremity: I82.409

## 2017-06-05 LAB — BASIC METABOLIC PANEL
Anion gap: 5 (ref 5–15)
BUN: 24 mg/dL — ABNORMAL HIGH (ref 6–20)
CHLORIDE: 110 mmol/L (ref 101–111)
CO2: 31 mmol/L (ref 22–32)
CREATININE: 0.76 mg/dL (ref 0.44–1.00)
Calcium: 8.4 mg/dL — ABNORMAL LOW (ref 8.9–10.3)
GFR calc non Af Amer: >60 mL/min (ref >60)
Glucose, Bld: 134 mg/dL — ABNORMAL HIGH (ref 65–99)
POTASSIUM: 3.5 mmol/L (ref 3.5–5.1)
SODIUM: 146 mmol/L — AB (ref 135–145)

## 2017-06-05 LAB — PREPARE RBC (CROSSMATCH)

## 2017-06-05 LAB — CBC
HCT: 21.5 % — ABNORMAL LOW (ref 35.0–47.0)
HEMOGLOBIN: 7.1 g/dL — AB (ref 12.0–16.0)
MCH: 31.3 pg (ref 26.0–34.0)
MCHC: 32.9 g/dL (ref 32.0–36.0)
MCV: 95.3 fL (ref 80.0–100.0)
Platelets: 290 10*3/uL (ref 150–440)
RBC: 2.26 MIL/uL — AB (ref 3.80–5.20)
RDW: 15.8 % — ABNORMAL HIGH (ref 11.5–14.5)
WBC: 6.3 10*3/uL (ref 3.6–11.0)

## 2017-06-05 LAB — HEMOGLOBIN AND HEMATOCRIT, BLOOD
HEMATOCRIT: 26.4 % — AB (ref 35.0–47.0)
Hemoglobin: 8.8 g/dL — ABNORMAL LOW (ref 12.0–16.0)

## 2017-06-05 SURGERY — SIGMOIDOSCOPY, FLEXIBLE
Anesthesia: General

## 2017-06-05 MED ORDER — SODIUM CHLORIDE 0.9 % IV SOLN: INTRAVENOUS | Status: DC

## 2017-06-05 MED ORDER — GUAIFENESIN ER 600 MG PO TB12
600.0000 mg | ORAL_TABLET | Freq: Two times a day (BID) | ORAL | Status: DC
  Administered 2017-06-05 – 2017-06-06 (×3): 600 mg via ORAL
  Filled 2017-06-05 (×3): qty 1

## 2017-06-05 MED ORDER — FENTANYL CITRATE (PF) 100 MCG/2ML IJ SOLN
INTRAMUSCULAR | Status: AC
  Filled 2017-06-05: qty 2

## 2017-06-05 MED ORDER — MIDAZOLAM HCL 5 MG/5ML IJ SOLN
INTRAMUSCULAR | Status: AC
  Filled 2017-06-05: qty 5

## 2017-06-05 MED ORDER — MIDAZOLAM HCL 2 MG/2ML IJ SOLN
INTRAMUSCULAR | Status: DC | PRN
Start: 2017-06-05 — End: 2017-06-05
  Administered 2017-06-05 (×2): 1 mg via INTRAVENOUS

## 2017-06-05 MED ORDER — LIDOCAINE HCL (PF) 1 % IJ SOLN: 2.0000 mL | Freq: Once | INTRAMUSCULAR | Status: DC

## 2017-06-05 MED ORDER — SODIUM CHLORIDE 0.9 % IV SOLN
Freq: Once | INTRAVENOUS | Status: AC
  Administered 2017-06-05: 09:00:00 via INTRAVENOUS

## 2017-06-05 MED ORDER — LIDOCAINE HCL (PF) 1 % IJ SOLN
INTRAMUSCULAR | Status: AC
  Filled 2017-06-05: qty 2

## 2017-06-05 NOTE — H&P (Signed)
Jonathon Bellows MD [address] [place], [place] [address] Phone: [phone] Fax : [phone]  Primary Care Physician:  Cletis Athens, MD Primary Gastroenterologist:  Dr. Jonathon Bellows   Pre-Procedure History & Physical: HPI:  Lindsey Hobbs is a 83 y.o. female is here for an flexible sigmoidoscopy.   Past Medical History:  Diagnosis Date  . Anemia   . Avascular necrosis of bone of hip ([place])   . COPD (chronic obstructive pulmonary disease) ([place])   . Guillain Barr syndrome ([place])   . Hyperlipidemia   . Hypertension   . Ulcerative colitis [facility])     Past Surgical History:  Procedure Laterality Date  . ABDOMINAL HYSTERECTOMY    . COLONOSCOPY WITH PROPOFOL Left 07/07/2015   Procedure: COLONOSCOPY WITH PROPOFOL;  Surgeon: Hulen Luster, MD;  Location: [facility] ENDOSCOPY;  Service: Endoscopy;  Laterality: Left;    Prior to Admission medications   Medication Sig Start Date End Date Taking? Authorizing Provider  Calcium Carbonate-Vitamin D (CALCIUM 600+D) 600-400 MG-UNIT per tablet Take 1 tablet by mouth 2 (two) times daily.   Yes [provider]  clopidogrel (PLAVIX) 75 MG tablet Take 75 mg by mouth daily. 05/30/17  Yes [provider]  ferrous sulfate 325 (65 FE) MG tablet Take 1 tablet (325 mg total) by mouth 2 (two) times daily with a meal. Patient taking differently: Take 325 mg by mouth daily with breakfast.  07/08/15  Yes Sudini, Alveta Heimlich, MD  fexofenadine (ALLEGRA) 180 MG tablet Take 180 mg by mouth at bedtime.   Yes [provider]  folic acid (FOLVITE) 1 MG tablet Take 1 mg by mouth daily.   Yes [provider]  gabapentin (NEURONTIN) 100 MG capsule Take 100-300 mg by mouth See admin instructions. One in the morning and 2 at night   Yes [provider]  loratadine (CLARITIN) 10 MG tablet Take 10 mg by mouth daily as needed for allergies.   Yes [provider]  mesalamine (LIALDA) 1.2 g EC tablet Take 2.4 g by mouth daily with  breakfast.   Yes [provider]  Multiple Vitamin (MULTIVITAMIN WITH MINERALS) TABS tablet Take 1 tablet by mouth daily.   Yes [provider]  Omega-3 Fatty Acids (FISH OIL PO) Take 1 capsule by mouth 3 (three) times daily.   Yes [provider]  pantoprazole (PROTONIX) 40 MG tablet Take 40 mg by mouth daily. 05/13/17  Yes [provider]  zolpidem (AMBIEN) 10 MG tablet Take 10 mg by mouth at bedtime as needed for sleep.   Yes [provider]    Allergies as of 06/01/2017  . (No Known Allergies)    Family History  Problem Relation Age of Onset  . Breast cancer Mother     Social History   Social History  . Marital status: Widowed    Spouse name: N/A  . Number of children: N/A  . Years of education: N/A   Occupational History  . Not on file.   Social History Main Topics  . Smoking status: Current Every Day Smoker    Packs/day: 0.50  . Smokeless tobacco: Never Used  . Alcohol use No  . Drug use: Unknown  . Sexual activity: Not on file   Other Topics Concern  . Not on file   Social History Narrative  . No narrative on file    Review of Systems: See HPI, otherwise negative ROS  Physical Exam: BP (!) 114/44   Pulse 71  Temp (!) 96.5 F (35.8 C) (Tympanic)   Resp 20   Ht 5\' 4"  (1.626 m)   Wt 117 lb 12.8 oz (53.4 kg)   SpO2 100%   BMI 20.22 kg/m  General:   Alert,  pleasant and cooperative in NAD Head:  Normocephalic and atraumatic. Neck:  Supple; no masses or thyromegaly. Lungs:  Clear throughout to auscultation.    Heart:  Regular rate and rhythm. Abdomen:  Soft, nontender and nondistended. Normal bowel sounds, without guarding, and without rebound.   Neurologic:  Alert and  oriented x4;  grossly normal neurologically.  Impression/Plan: Lindsey Hobbs is here for an flexible sigmoidoscopy to be performed for rectal bleeding   Risks, benefits, limitations, and alternatives regarding  flexible sigmoidoscopy have  been reviewed with the patient.  Questions have been answered.  All parties agreeable.   Jonathon Bellows, MD  06/05/2017, 11:26 AM

## 2017-06-05 NOTE — Progress Notes (Signed)
Blood was initiated at 0959. RN noticed that blood was leaking on the bed at 1010. Blood was paused while we regained IV access. Blood was restarted at 1040.

## 2017-06-05 NOTE — Progress Notes (Addendum)
[place] [facility] [place] NAME: Roseanna Koplin    MR#:  [id number]  DATE OF BIRTH:  1927/04/11  SUBJECTIVE:  CHIEF COMPLAINT:   Chief Complaint  Patient presents with  . Rectal Bleeding   - Continues to have bloody stools, though decreased in frequency. -Hemoglobin drop noted. Will get 1 unit transfusion today -Flexible sigmoidoscopy this afternoon  REVIEW OF SYSTEMS:  Review of Systems  Constitutional: Positive for malaise/fatigue. Negative for chills and fever.  HENT: Negative for congestion, ear discharge, hearing loss and nosebleeds.   Eyes: Negative for blurred vision and double vision.  Respiratory: Positive for cough and shortness of breath. Negative for wheezing.   Cardiovascular: Negative for chest pain, palpitations and leg swelling.  Gastrointestinal: Positive for blood in stool. Negative for abdominal pain, constipation, diarrhea, nausea and vomiting.  Genitourinary: Negative for dysuria.  Musculoskeletal: Negative for myalgias.  Neurological: Negative for dizziness, speech change, focal weakness, seizures and headaches.  Psychiatric/Behavioral: Negative for depression.    DRUG ALLERGIES:  No Known Allergies  VITALS:  Blood pressure (!) 121/45, pulse 89, temperature 98.1 F (36.7 C), resp. rate 17, height 5\' 4"  (1.626 m), weight 53.4 kg (117 lb 12.8 oz), SpO2 99 %.  PHYSICAL EXAMINATION:  Physical Exam  GENERAL:  83 y.o.-year-old Elderly patient lying in the bed with no acute distress.  EYES: Pupils equal, round, reactive to light and accommodation. No scleral icterus. Extraocular muscles intact.  HEENT: Head atraumatic, normocephalic. Oropharynx and nasopharynx clear.  NECK:  Supple, no jugular venous distention. No thyroid enlargement, no tenderness.  LUNGS: decreased bibasilar breath sounds, scattered end inspiratory wheezing, no rales or crepitation. No use of accessory muscles of respiration. Decreased bibasilar  breath soundsWith some rhonchi noted today CARDIOVASCULAR: S1, S2 normal. No rubs, or gallops. 2/6 systolic murmur is present ABDOMEN: Soft, nontender, nondistended. Bowel sounds present. No organomegaly or mass.  EXTREMITIES: No pedal edema, cyanosis, or clubbing.  NEUROLOGIC: Cranial nerves II through XII are intact. Muscle strength 5/5 in all extremities. Sensation intact. Gait not checked.  PSYCHIATRIC: The patient is alert and oriented x 3.  SKIN: No obvious rash, lesion, or ulcer.    LABORATORY PANEL:   CBC  Recent Labs Lab 06/05/17 0401  WBC 6.3  HGB 7.1*  HCT 21.5*  PLT 290   ------------------------------------------------------------------------------------------------------------------  Chemistries   Recent Labs Lab 06/01/17 1109 06/05/17 0401  NA 138 146*  K 3.8 3.5  CL 100* 110  CO2 32 31  GLUCOSE 98 134*  BUN 28* 24*  CREATININE 1.07* 0.76  CALCIUM 9.5 8.4*  AST 18  --   ALT 6*  --   ALKPHOS 49  --   BILITOT 0.3  --    ------------------------------------------------------------------------------------------------------------------  Cardiac Enzymes No results for input(s): TROPONINI in the last 168 hours. ------------------------------------------------------------------------------------------------------------------  RADIOLOGY:  No results found.  EKG:   Orders placed or performed during the hospital encounter of 06/01/17  . EKG 12-Lead  . EKG 12-Lead    ASSESSMENT AND PLAN:   83 year old female with past medical history significant for ulcerative colitis, hypertension, COPD not on home oxygen presents to hospital secondary to worsening bright red blood per rectum.  #1 GI bleed-lower GI bleed with bright red blood per rectum. secondary to colitis flare -Known history of ulcerative colitis.  -Colonoscopy done in 2016 showing friable ulcerative mucosa in the colon. -continues to have bloody stools and hb dropped to 7-will get 1 unit  transfusion today -If  has diarrhea, check C. Difficile. - for flexible sigmoidoscopy this afternoon. -On IV steroids and mesalamine enema -Has been on liquid diet. If colitis looks bad, will need Infliximab infusion as inpatient  #2 COPD- with mild exacerbation yesterday, on steroids anyways. -, wean o2 as tolerated, continues to smoke as outpatient, on nicotine patch continue inhalers -Chest x-ray today due to cough, Mucinex added  #3 peripheral neuropathy-on gabapentin  #4 GERD-on Protonix  #5 DVT prophylaxis-lovenox added per GI recommendations  Encourage ambulation Updated son over the phone   All the records are reviewed and case discussed with Care Management/Social Workerr. Management plans discussed with the patient, family and they are in agreement.  CODE STATUS: DNR  TOTAL TIME TAKING CARE OF THIS PATIENT: 38 minutes.   POSSIBLE D/C in 1-2 days, DEPENDING ON CLINICAL CONDITION.   Elanora Quin M.D on 06/05/2017 at 9:47 AM  Between 7am to 6pm - Pager - [phone]  After 6pm go to [email or web address] - password [email or web address] [facility]  Office  [phone]  CC: Primary care physician; Cletis Athens, MD

## 2017-06-05 NOTE — Op Note (Signed)
[facility] Gastroenterology Patient Name: Lindsey Hobbs Procedure Date: 06/05/2017 10:56 AM MRN: [id number] Account #: [id number] Date of Birth: [birth year] Admit Type: Inpatient Age: 83 Room: [facility] ENDO ROOM 2 Gender: Female Note Status: Finalized Procedure:            Colonoscopy Indications:          Rectal bleeding Providers:            Jonathon Bellows MD, MD Referring MD:         Cletis Athens, MD (Referring MD) Medicines:            Monitored Anesthesia Care Complications:        No immediate complications. Procedure:            Pre-Anesthesia Assessment:                       - Prior to the procedure, a History and Physical was                        performed, and patient medications, allergies and                        sensitivities were reviewed. The patient's tolerance of                        previous anesthesia was reviewed.                       - The risks and benefits of the procedure and the                        sedation options and risks were discussed with the                        patient. All questions were answered and informed                        consent was obtained.                       - ASA Grade Assessment: III - A patient with severe                        systemic disease.                       After obtaining informed consent, the colonoscope was                        passed under direct vision. Throughout the procedure,                        the patient's blood pressure, pulse, and oxygen                        saturations were monitored continuously. The                        Colonoscope was introduced through the anus and                        advanced to  the the terminal ileum. The colonoscopy was                        performed with moderate difficulty due to restricted                        mobility of the colon. The patient tolerated the                        procedure well. The quality of the bowel preparation                   was adequate. Findings:      liquid stool yellow in color was found in the distal ileum .      Discontinuous areas of nonbleeding ulcerated mucosa with no stigmata of       recent bleeding were present in the sigmoid colon and in the ascending       colon. No biopsies were taken due to large qty of balck stool seen ,       Didnt want to confuse the picture if she were to bleed after the       procedue.      A benign-appearing, intrinsic severe stenosis measuring 2 cm (in length)       x 1.2 cm (inner diameter) was found in the sigmoid colon and was       traversed only after a pediatric colnoscope was changed to an upper       adult endoscope.      Hematin (altered blood/coffee-ground-like material) was found in the       entire colon.      Initially I intended to do only a flexible sigmoidoscopy but due to the       large qty of black colored stool felt it a necessity and urgent to       determine if bleeding was from the colon or higher up and hence       proceeded to do a colonoscopy.      Multiple small-mouthed diverticula were found in the entire colon.      The exam was otherwise without abnormality on direct and retroflexion       views. Impression:           - Stool in the distal ileum.                       - Mucosal ulceration.                       - Stricture in the sigmoid colon.                       - Blood in the entire examined colon.                       - No specimens collected. Recommendation:       - Return patient to hospital ward for ongoing care.                       - Advance diet as tolerated.                       - Continue present medications.                       -  1. Bleeding likely diverticular or from ulcers likely                        secondary to Crohns disease                       2. Change to oral steroids tomorrow if has no further                        bleeding                       3. Repeat Colonoscopy as an outpatient  with upper                        endoscope to take biopsies of ulcers seen at the                        strictures to r/o malignancy . She sees Dr Tiffany Kocher as an                        out patient                       4. This patient has Crohns disease and not ulcerative                        colitis.                       5. No blood seen in the terminal ileum and clear bile                        noted hence not an upper GI bleed or a small bowel                        bleed. Procedure Code(s):    --- Professional ---                       [phone], Colonoscopy, flexible; diagnostic, including                        collection of specimen(s) by brushing or washing, when                        performed (separate procedure) Diagnosis Code(s):    --- Professional ---                       K63.3, Ulcer of intestine                       K56.69, Other intestinal obstruction                       K92.2, Gastrointestinal hemorrhage, unspecified                       K62.5, Hemorrhage of anus and rectum CPT copyright 2016 American Medical Association. All rights reserved. The codes documented in this report are preliminary and upon coder review may  be revised to meet current compliance requirements. Jonathon Bellows, MD Jonathon Bellows  MD, MD 06/05/2017 12:14:32 PM This report has been signed electronically. Number of Addenda: 0 Note Initiated On: 06/05/2017 10:56 AM Total Procedure Duration: 0 hours 18 minutes 0 seconds       [facility]

## 2017-06-06 ENCOUNTER — Encounter: Payer: Self-pay | Admitting: Gastroenterology

## 2017-06-06 DIAGNOSIS — K5190 Ulcerative colitis, unspecified, without complications: Secondary | ICD-10-CM | POA: Diagnosis present

## 2017-06-06 LAB — BPAM RBC
Blood Product Expiration Date: 201808132359
ISSUE DATE / TIME: 201808010948
Unit Type and Rh: 600

## 2017-06-06 LAB — TYPE AND SCREEN
ABO/RH(D): A NEG
ANTIBODY SCREEN: NEGATIVE
Unit division: 0

## 2017-06-06 LAB — CBC
HCT: 25.1 % — ABNORMAL LOW (ref 35.0–47.0)
HEMOGLOBIN: 8.3 g/dL — AB (ref 12.0–16.0)
MCH: 30.2 pg (ref 26.0–34.0)
MCHC: 33.2 g/dL (ref 32.0–36.0)
MCV: 91 fL (ref 80.0–100.0)
PLATELETS: 260 10*3/uL (ref 150–440)
RBC: 2.76 MIL/uL — ABNORMAL LOW (ref 3.80–5.20)
RDW: 20.1 % — AB (ref 11.5–14.5)
WBC: 5.7 10*3/uL (ref 3.6–11.0)

## 2017-06-06 MED ORDER — GUAIFENESIN ER 600 MG PO TB12: 600.0000 mg | ORAL_TABLET | Freq: Two times a day (BID) | ORAL | 0 refills | Status: DC

## 2017-06-06 MED ORDER — IPRATROPIUM-ALBUTEROL 0.5-2.5 (3) MG/3ML IN SOLN: 3.0000 mL | Freq: Four times a day (QID) | RESPIRATORY_TRACT | 0 refills | Status: AC | PRN

## 2017-06-06 MED ORDER — PREDNISONE 10 MG PO TABS: ORAL_TABLET | ORAL | 0 refills | Status: DC

## 2017-06-06 MED ORDER — BACITRACIN-NEOMYCIN-POLYMYXIN OINTMENT TUBE
TOPICAL_OINTMENT | Freq: Two times a day (BID) | CUTANEOUS | Status: DC
  Administered 2017-06-06: 10:00:00 via TOPICAL
  Filled 2017-06-06: qty 14.17

## 2017-06-06 MED ORDER — AZITHROMYCIN 250 MG PO TABS
ORAL_TABLET | ORAL | 0 refills | Status: AC
Start: 2017-06-06 — End: 2017-06-11

## 2017-06-06 NOTE — Progress Notes (Signed)
IV was removed. Discharge instructions, follow-up appointments, and prescriptions were provided to the pt. The pt is waiting for son to transport home.

## 2017-06-06 NOTE — Progress Notes (Signed)
Physical Therapy Treatment Patient Details Name: Lindsey Hobbs MRN: [id number] DOB: 10/24/1927 Today's Date: 06/06/2017    History of Present Illness Patient is an 83 y/o female that presents with BRBPR, has also had some dyspnea and shortness of breath.     PT Comments    In bed on 1 lpm Sats 95%.  Discussed with nurse and aware of orders to wean.  O2 removed.  Remained in mid 90's .  To bathroom on room air where O2 dipped briefly to 89% but quickly returned to low 90's. Pt with large amount of blood in commode.  Priamary nurse notified and in to check.  Pt stated she felt well and wanted to ambulate.  She walked around unit x 1 with walker and min guard.  Upon return to room, Sats 94% and no SOB noted.  O2 left off.     Follow Up Recommendations  Home health PT     Equipment Recommendations  Rolling walker with 5" wheels    Recommendations for Other Services       Precautions / Restrictions Precautions Precautions: Fall Restrictions Weight Bearing Restrictions: No    Mobility  Bed Mobility Overal bed mobility: Independent                Transfers Overall transfer level: Modified independent Equipment used: Rolling walker (2 wheeled)                Ambulation/Gait Ambulation/Gait assistance: Min guard Ambulation Distance (Feet): 200 Feet Assistive device: Rolling walker (2 wheeled) Gait Pattern/deviations: Step-through pattern   Gait velocity interpretation: <1.8 ft/sec, indicative of risk for recurrent falls General Gait Details: generally steady with walker   Stairs            Wheelchair Mobility    Modified Rankin (Stroke Patients Only)       Balance Overall balance assessment: Needs assistance Sitting-balance support: No upper extremity supported;Feet supported Sitting balance-Leahy Scale: Good     Standing balance support: Bilateral upper extremity supported Standing balance-Leahy Scale: Good                               Cognition Arousal/Alertness: Awake/alert Behavior During Therapy: WFL for tasks assessed/performed Overall Cognitive Status: Within Functional Limits for tasks assessed                                        Exercises Other Exercises Other Exercises: to commode with large amount of blood.  Primary nurse Lindsey Hobbs notified and in to check.      General Comments        Pertinent Vitals/Pain Pain Assessment: No/denies pain    Home Living                      Prior Function            PT Goals (current goals can now be found in the care plan section) Progress towards PT goals: Progressing toward goals    Frequency    Min 2X/week      PT Plan Current plan remains appropriate    Co-evaluation              AM-PAC PT "6 Clicks" Daily Activity  Outcome Measure  Difficulty turning over in bed (including adjusting bedclothes, sheets and blankets)?: None Difficulty moving from  lying on back to sitting on the side of the bed? : None Difficulty sitting down on and standing up from a chair with arms (e.g., wheelchair, bedside commode, etc,.)?: A Little Help needed moving to and from a bed to chair (including a wheelchair)?: A Little Help needed walking in hospital room?: A Little Help needed climbing 3-5 steps with a railing? : A Little 6 Click Score: 20    End of Session Equipment Utilized During Treatment: Gait belt Activity Tolerance: Patient tolerated treatment well Patient left: in bed;with bed alarm set;with call bell/phone within reach         Time: 0957-1021 PT Time Calculation (min) (ACUTE ONLY): 24 min  Charges:  $Gait Training: 8-22 mins $Therapeutic Activity: 8-22 mins                    G Codes:       Chesley Noon, PTA 06/06/17, 10:32 AM

## 2017-06-06 NOTE — Care Management Important Message (Signed)
Important Message  Patient Details  Name: Lindsey Hobbs MRN: [id number] Date of Birth: [birth year]   Medicare Important Message Given:  Yes    Beverly Sessions, RN 06/06/2017, 2:16 PM

## 2017-06-06 NOTE — Discharge Summary (Signed)
[facility] [place] NAME: Lindsey Hobbs    MR#:  [id number]  DATE OF BIRTH:  11/21/1926  DATE OF ADMISSION:  06/01/2017   ADMITTING PHYSICIAN: Baxter Hire, MD  DATE OF DISCHARGE:  06/06/17  PRIMARY CARE PHYSICIAN: Cletis Athens, MD   ADMISSION DIAGNOSIS:   Rectal bleeding [K62.5]  DISCHARGE DIAGNOSIS:   Active Problems:   GI bleed   Rectal bleed   Anemia   SECONDARY DIAGNOSIS:   Past Medical History:  Diagnosis Date  . Anemia   . Avascular necrosis of bone of hip ([place])   . COPD (chronic obstructive pulmonary disease) ([place])   . Crohn's disease ([place])   . Guillain Barr syndrome ([place])   . Hyperlipidemia   . Hypertension     HOSPITAL COURSE:   83 year old female with past medical history significant for ulcerative colitis, hypertension, COPD not on home oxygen presents to hospital secondary to worsening bright red blood per rectum.  #1 GI bleed-lower GI bleed with bright red blood per rectum. secondary to Crohn's disease flare flare -Used to think and all documentation supported that she has ulcerative colitis, but flex sigmoidoscopy shows a lot of mucosal ulcers with strictures consistent with Crohn's disease. -Colonoscopy done in 2016 showing friable ulcerative mucosa in the colon. -Appreciate GI consult. Patient is status post flex sigmoidoscopy that showed mucosal ulcerations and a stricture. She'll need an outpatient colonoscopy for biopsy of that stricture to rule out malignancy. -Had a lot of old blood in the colon that was cleaned out, no active bleeding was noted. -Received one unit blood transfusion yesterday and hemoglobin is stable and greater than 8 at this time - Advance diet today. Patient feeling very stable and wants to get discharged today -Continue mesalamine. Change IV steroids to prednisone for a month taper. -Outpatient GI follow-up in 1-2 weeks  #2 COPD- with mild exacerbation, on steroids  anyways. -She has had COPD for several years, with ongoing smoking. Likely will need oxygen at discharge as she has failed treatment only with steroids and nebulizers here. -We'll continue to wean as tolerated and check qualifying O2 sats. -Chest x-ray showing COPD changeswith small pleural effusions and atelectasis versus early infiltrate. Will add azithromycin. Cardiomegaly noted without pulmonary vascular congestion.  #3 peripheral neuropathy-on gabapentin  #4 GERD-on Protonix  Anticipate discharge today  DISCHARGE CONDITIONS:   Guarded  CONSULTS OBTAINED:   Treatment Team:  Thornton Park, MD  DRUG ALLERGIES:   No Known Allergies DISCHARGE MEDICATIONS:   Allergies as of 06/06/2017   No Known Allergies     Medication List    STOP taking these medications   clopidogrel 75 MG tablet Commonly known as:  PLAVIX     TAKE these medications   azithromycin 250 MG tablet Commonly known as:  ZITHROMAX Z-PAK Take 2 tablets (500 mg) on  Day 1,  followed by 1 tablet (250 mg) once daily on Days 2 through 5.   CALCIUM 600+D 600-400 MG-UNIT tablet Generic drug:  Calcium Carbonate-Vitamin D Take 1 tablet by mouth 2 (two) times daily.   ferrous sulfate 325 (65 FE) MG tablet Take 1 tablet (325 mg total) by mouth 2 (two) times daily with a meal. What changed:  when to take this   fexofenadine 180 MG tablet Commonly known as:  ALLEGRA Take 180 mg by mouth at bedtime.   FISH OIL PO Take 1 capsule by mouth 3 (three) times daily.   folic acid 1  MG tablet Commonly known as:  FOLVITE Take 1 mg by mouth daily.   gabapentin 100 MG capsule Commonly known as:  NEURONTIN Take 100-300 mg by mouth See admin instructions. One in the morning and 2 at night   guaiFENesin 600 MG 12 hr tablet Commonly known as:  MUCINEX Take 1 tablet (600 mg total) by mouth 2 (two) times daily.   ipratropium-albuterol 0.5-2.5 (3) MG/3ML Soln Commonly known as:  DUONEB Take 3 mLs by nebulization  every 6 (six) hours as needed.   loratadine 10 MG tablet Commonly known as:  CLARITIN Take 10 mg by mouth daily as needed for allergies.   mesalamine 1.2 g EC tablet Commonly known as:  LIALDA Take 2.4 g by mouth daily with breakfast.   multivitamin with minerals Tabs tablet Take 1 tablet by mouth daily.   pantoprazole 40 MG tablet Commonly known as:  PROTONIX Take 40 mg by mouth daily.   predniSONE 10 MG tablet Commonly known as:  DELTASONE Take 5 tablets q daily for 5 days and then 4 tablets q daily for 5 days and 3 tablets q daily for 5 days and 2 tablets q daily for 5 days and then 1 tablet q daily for 5 days and stop   zolpidem 10 MG tablet Commonly known as:  AMBIEN Take 10 mg by mouth at bedtime as needed for sleep.            Durable Medical Equipment        Start     Ordered   06/06/17 [phone]  For home use only DME Nebulizer machine  Once    Question:  Patient needs a nebulizer to treat with the following condition  Answer:  COPD (chronic obstructive pulmonary disease) ([place])   06/06/17 0941       DISCHARGE INSTRUCTIONS:   1. PCP f/u in 1-2 weeks 2. GI f/u in 1-2 weeks  DIET:   Cardiac diet  ACTIVITY:   Activity as tolerated  OXYGEN:   Home Oxygen: Yes.    Oxygen Delivery: 2 liters/min via Patient connected to nasal cannula oxygen  DISCHARGE LOCATION:   home   If you experience worsening of your admission symptoms, develop shortness of breath, life threatening emergency, suicidal or homicidal thoughts you must seek medical attention immediately by calling 911 or calling your MD immediately  if symptoms less severe.  You Must read complete instructions/literature along with all the possible adverse reactions/side effects for all the Medicines you take and that have been prescribed to you. Take any new Medicines after you have completely understood and accpet all the possible adverse reactions/side effects.   Please note  You were cared for by  a hospitalist during your hospital stay. If you have any questions about your discharge medications or the care you received while you were in the hospital after you are discharged, you can call the unit and asked to speak with the hospitalist on call if the hospitalist that took care of you is not available. Once you are discharged, your primary care physician will handle any further medical issues. Please note that NO REFILLS for any discharge medications will be authorized once you are discharged, as it is imperative that you return to your primary care physician (or establish a relationship with a primary care physician if you do not have one) for your aftercare needs so that they can reassess your need for medications and monitor your lab values.    On the day of  Discharge:  VITAL SIGNS:   Blood pressure (!) 119/49, pulse 72, temperature 98.8 F (37.1 C), resp. rate 16, height 5\' 4"  (1.626 m), weight 53.4 kg (117 lb 12.8 oz), SpO2 99 %.  PHYSICAL EXAMINATION:    GENERAL:  83 y.o.-year-old Elderly patient lying in the bed with no acute distress.  EYES: Pupils equal, round, reactive to light and accommodation. No scleral icterus. Extraocular muscles intact.  HEENT: Head atraumatic, normocephalic. Oropharynx and nasopharynx clear.  NECK:  Supple, no jugular venous distention. No thyroid enlargement, no tenderness.  LUNGS: decreased bibasilar breath sounds, scattered end inspiratory wheezing, no rales or crepitation. No use of accessory muscles of respiration. Decreased bibasilar breath soundsWith some rhonchi noted today CARDIOVASCULAR: S1, S2 normal. No rubs, or gallops. 2/6 systolic murmur is present ABDOMEN: Soft, nontender, nondistended. Bowel sounds present. No organomegaly or mass.  EXTREMITIES: No pedal edema, cyanosis, or clubbing.  NEUROLOGIC: Cranial nerves II through XII are intact. Muscle strength 5/5 in all extremities. Sensation intact. Gait not checked.  PSYCHIATRIC: The  patient is alert and oriented x 3.  SKIN: No obvious rash, lesion, or ulcer.   DATA REVIEW:   CBC  Recent Labs Lab 06/06/17 0537  WBC 5.7  HGB 8.3*  HCT 25.1*  PLT 260    Chemistries   Recent Labs Lab 06/01/17 1109 06/05/17 0401  NA 138 146*  K 3.8 3.5  CL 100* 110  CO2 32 31  GLUCOSE 98 134*  BUN 28* 24*  CREATININE 1.07* 0.76  CALCIUM 9.5 8.4*  AST 18  --   ALT 6*  --   ALKPHOS 49  --   BILITOT 0.3  --      Microbiology Results  No results found for this or any previous visit.  RADIOLOGY:  Dg Chest 2 View  Result Date: 06/05/2017 CLINICAL DATA:  Cough. History of COPD and hypertension. Current smoker. EXAM: CHEST  2 VIEW COMPARISON:  PA and lateral chest x-ray of June 01, 2017 FINDINGS: The lungs are mildly hyperinflated. There is patchy increased density in the lingula. There are new small bilateral pleural effusions layering posteriorly. The cardiac silhouette is mildly enlarged. The pulmonary vascularity is not clearly engorged. There is calcification in the wall of the mitral valvular annulus and in the aortic arch. There is chronic wedge compression of T9. IMPRESSION: COPD. New small bilateral pleural effusions layering posteriorly. No discrete pneumonia is demonstrated but patchy lingular atelectasis or early infiltrate is suspected. Followup PA and lateral chest X-ray is recommended in 3-4 weeks following trial of antibiotic therapy to ensure resolution and exclude underlying malignancy. Cardiomegaly without pulmonary vascular congestion. Thoracic aortic atherosclerosis. Electronically Signed   By: David  Martinique M.D.   On: 06/05/2017 14:41     Management plans discussed with the patient, family and they are in agreement.  CODE STATUS:     Code Status Orders        Start     Ordered   06/01/17 1501  Do not attempt resuscitation (DNR)  Continuous    Question Answer Comment  In the event of cardiac or respiratory ARREST Do not call a "code blue"   In  the event of cardiac or respiratory ARREST Do not perform Intubation, CPR, defibrillation or ACLS   In the event of cardiac or respiratory ARREST Use medication by any route, position, wound care, and other measures to relive pain and suffering. May use oxygen, suction and manual treatment of airway obstruction as needed for comfort.  06/01/17 1500    Code Status History    Date Active Date Inactive Code Status Order ID Comments User Context   07/05/2015  5:33 PM 07/08/2015  8:17 PM DNR [id number]  Vaughan Basta, MD Inpatient    Advance Directive Documentation     Most Recent Value  Type of Advance Directive  Living will, Healthcare Power of Attorney  Pre-existing out of facility DNR order (yellow form or pink MOST form)  -  "MOST" Form in Place?  -      TOTAL TIME TAKING CARE OF THIS PATIENT: 37 minutes.    Marquarius Lofton M.D on 06/06/2017 at 9:41 AM  Between 7am to 6pm - Pager - [phone]  After 6pm go to [email or web address] - Proofreader  [facility]  Office  [phone]  CC: Primary care physician; Cletis Athens, MD   Note: This dictation was prepared with Dragon dictation along with smaller phrase technology. Any transcriptional errors that result from this process are unintentional.

## 2017-06-06 NOTE — Care Management (Signed)
Patient admitted from home with GI bleed.  Patient lives at home alone.  Son lives locally for support.  Son provides transportation.  PCP [facility].  Pharmacy [place].  PT has assessed patient and recommends home health PT.  Patient has RW, and cane in the home for ambulation, however she prefers her cane.  Patient states that she has life alert services, as well as [facility] that come out once a week.  Patient agreeable to home health services.  Patient offered home health agency preference. Patient states that she does not have a preference.  Referral made to [facility].  Home nebulizer delivered by [facility] from [place] prior to discharge.  RNCM signing off.

## 2017-06-08 DIAGNOSIS — M21612 Bunion of left foot: Secondary | ICD-10-CM | POA: Diagnosis not present

## 2017-06-08 DIAGNOSIS — I83025 Varicose veins of left lower extremity with ulcer other part of foot: Secondary | ICD-10-CM | POA: Diagnosis not present

## 2017-06-08 DIAGNOSIS — I739 Peripheral vascular disease, unspecified: Secondary | ICD-10-CM | POA: Diagnosis not present

## 2017-06-08 DIAGNOSIS — L97522 Non-pressure chronic ulcer of other part of left foot with fat layer exposed: Secondary | ICD-10-CM | POA: Diagnosis not present

## 2017-06-08 DIAGNOSIS — I10 Essential (primary) hypertension: Secondary | ICD-10-CM | POA: Diagnosis not present

## 2017-06-10 DIAGNOSIS — I83025 Varicose veins of left lower extremity with ulcer other part of foot: Secondary | ICD-10-CM | POA: Diagnosis not present

## 2017-06-10 DIAGNOSIS — I10 Essential (primary) hypertension: Secondary | ICD-10-CM | POA: Diagnosis not present

## 2017-06-10 DIAGNOSIS — L97522 Non-pressure chronic ulcer of other part of left foot with fat layer exposed: Secondary | ICD-10-CM | POA: Diagnosis not present

## 2017-06-10 DIAGNOSIS — M21612 Bunion of left foot: Secondary | ICD-10-CM | POA: Diagnosis not present

## 2017-06-10 DIAGNOSIS — I739 Peripheral vascular disease, unspecified: Secondary | ICD-10-CM | POA: Diagnosis not present

## 2017-06-11 ENCOUNTER — Ambulatory Visit: Payer: Medicare Other | Admitting: Podiatry

## 2017-06-12 DIAGNOSIS — M21612 Bunion of left foot: Secondary | ICD-10-CM | POA: Diagnosis not present

## 2017-06-12 DIAGNOSIS — I83025 Varicose veins of left lower extremity with ulcer other part of foot: Secondary | ICD-10-CM | POA: Diagnosis not present

## 2017-06-12 DIAGNOSIS — I739 Peripheral vascular disease, unspecified: Secondary | ICD-10-CM | POA: Diagnosis not present

## 2017-06-12 DIAGNOSIS — I10 Essential (primary) hypertension: Secondary | ICD-10-CM | POA: Diagnosis not present

## 2017-06-12 DIAGNOSIS — L97522 Non-pressure chronic ulcer of other part of left foot with fat layer exposed: Secondary | ICD-10-CM | POA: Diagnosis not present

## 2017-06-14 DIAGNOSIS — I739 Peripheral vascular disease, unspecified: Secondary | ICD-10-CM | POA: Diagnosis not present

## 2017-06-14 DIAGNOSIS — M21612 Bunion of left foot: Secondary | ICD-10-CM | POA: Diagnosis not present

## 2017-06-14 DIAGNOSIS — L97522 Non-pressure chronic ulcer of other part of left foot with fat layer exposed: Secondary | ICD-10-CM | POA: Diagnosis not present

## 2017-06-14 DIAGNOSIS — I83025 Varicose veins of left lower extremity with ulcer other part of foot: Secondary | ICD-10-CM | POA: Diagnosis not present

## 2017-06-14 DIAGNOSIS — I10 Essential (primary) hypertension: Secondary | ICD-10-CM | POA: Diagnosis not present

## 2017-06-17 DIAGNOSIS — L97522 Non-pressure chronic ulcer of other part of left foot with fat layer exposed: Secondary | ICD-10-CM | POA: Diagnosis not present

## 2017-06-17 DIAGNOSIS — I83025 Varicose veins of left lower extremity with ulcer other part of foot: Secondary | ICD-10-CM | POA: Diagnosis not present

## 2017-06-17 DIAGNOSIS — I10 Essential (primary) hypertension: Secondary | ICD-10-CM | POA: Diagnosis not present

## 2017-06-17 DIAGNOSIS — M21612 Bunion of left foot: Secondary | ICD-10-CM | POA: Diagnosis not present

## 2017-06-17 DIAGNOSIS — I739 Peripheral vascular disease, unspecified: Secondary | ICD-10-CM | POA: Diagnosis not present

## 2017-06-18 ENCOUNTER — Emergency Department: Payer: Medicare Other

## 2017-06-18 ENCOUNTER — Inpatient Hospital Stay
Admit: 2017-06-18 | Discharge: 2017-06-18 | Disposition: A | Discharge status: Home / Self Care | Payer: Medicare Other | Attending: Internal Medicine | Admitting: Internal Medicine

## 2017-06-18 ENCOUNTER — Inpatient Hospital Stay: Payer: Medicare Other

## 2017-06-18 ENCOUNTER — Inpatient Hospital Stay
Admission: EM | Admit: 2017-06-18 | Discharge: 2017-06-24 | DRG: 291 | Disposition: A | Discharge status: Skilled Nursing Facility | Payer: Medicare Other | Attending: Internal Medicine | Admitting: Internal Medicine

## 2017-06-18 ENCOUNTER — Encounter: Payer: Self-pay | Admitting: Emergency Medicine

## 2017-06-18 DIAGNOSIS — E870 Hyperosmolality and hypernatremia: Secondary | ICD-10-CM | POA: Diagnosis present

## 2017-06-18 DIAGNOSIS — I82401 Acute embolism and thrombosis of unspecified deep veins of right lower extremity: Secondary | ICD-10-CM | POA: Diagnosis not present

## 2017-06-18 DIAGNOSIS — Z7982 Long term (current) use of aspirin: Secondary | ICD-10-CM | POA: Diagnosis not present

## 2017-06-18 DIAGNOSIS — I10 Essential (primary) hypertension: Secondary | ICD-10-CM | POA: Diagnosis not present

## 2017-06-18 DIAGNOSIS — D649 Anemia, unspecified: Secondary | ICD-10-CM | POA: Diagnosis not present

## 2017-06-18 DIAGNOSIS — R0600 Dyspnea, unspecified: Secondary | ICD-10-CM | POA: Diagnosis not present

## 2017-06-18 DIAGNOSIS — I2510 Atherosclerotic heart disease of native coronary artery without angina pectoris: Secondary | ICD-10-CM | POA: Diagnosis not present

## 2017-06-18 DIAGNOSIS — G4700 Insomnia, unspecified: Secondary | ICD-10-CM | POA: Diagnosis present

## 2017-06-18 DIAGNOSIS — I82541 Chronic embolism and thrombosis of right tibial vein: Secondary | ICD-10-CM | POA: Diagnosis not present

## 2017-06-18 DIAGNOSIS — N289 Disorder of kidney and ureter, unspecified: Secondary | ICD-10-CM | POA: Diagnosis not present

## 2017-06-18 DIAGNOSIS — J9601 Acute respiratory failure with hypoxia: Secondary | ICD-10-CM | POA: Diagnosis present

## 2017-06-18 DIAGNOSIS — Z515 Encounter for palliative care: Secondary | ICD-10-CM

## 2017-06-18 DIAGNOSIS — I509 Heart failure, unspecified: Secondary | ICD-10-CM

## 2017-06-18 DIAGNOSIS — I420 Dilated cardiomyopathy: Secondary | ICD-10-CM | POA: Diagnosis present

## 2017-06-18 DIAGNOSIS — Z7401 Bed confinement status: Secondary | ICD-10-CM | POA: Diagnosis not present

## 2017-06-18 DIAGNOSIS — F17210 Nicotine dependence, cigarettes, uncomplicated: Secondary | ICD-10-CM | POA: Diagnosis present

## 2017-06-18 DIAGNOSIS — I82491 Acute embolism and thrombosis of other specified deep vein of right lower extremity: Secondary | ICD-10-CM | POA: Diagnosis not present

## 2017-06-18 DIAGNOSIS — I82441 Acute embolism and thrombosis of right tibial vein: Secondary | ICD-10-CM | POA: Diagnosis present

## 2017-06-18 DIAGNOSIS — E8770 Fluid overload, unspecified: Secondary | ICD-10-CM | POA: Diagnosis not present

## 2017-06-18 DIAGNOSIS — I5023 Acute on chronic systolic (congestive) heart failure: Secondary | ICD-10-CM | POA: Diagnosis not present

## 2017-06-18 DIAGNOSIS — E860 Dehydration: Secondary | ICD-10-CM | POA: Diagnosis present

## 2017-06-18 DIAGNOSIS — Z716 Tobacco abuse counseling: Secondary | ICD-10-CM | POA: Diagnosis not present

## 2017-06-18 DIAGNOSIS — Z821 Family history of blindness and visual loss: Secondary | ICD-10-CM | POA: Diagnosis not present

## 2017-06-18 DIAGNOSIS — Z7189 Other specified counseling: Secondary | ICD-10-CM

## 2017-06-18 DIAGNOSIS — I248 Other forms of acute ischemic heart disease: Secondary | ICD-10-CM | POA: Diagnosis not present

## 2017-06-18 DIAGNOSIS — R0603 Acute respiratory distress: Secondary | ICD-10-CM | POA: Diagnosis not present

## 2017-06-18 DIAGNOSIS — Z90710 Acquired absence of both cervix and uterus: Secondary | ICD-10-CM

## 2017-06-18 DIAGNOSIS — I5020 Unspecified systolic (congestive) heart failure: Secondary | ICD-10-CM | POA: Diagnosis not present

## 2017-06-18 DIAGNOSIS — I82501 Chronic embolism and thrombosis of unspecified deep veins of right lower extremity: Secondary | ICD-10-CM

## 2017-06-18 DIAGNOSIS — Z803 Family history of malignant neoplasm of breast: Secondary | ICD-10-CM

## 2017-06-18 DIAGNOSIS — I110 Hypertensive heart disease with heart failure: Secondary | ICD-10-CM | POA: Diagnosis not present

## 2017-06-18 DIAGNOSIS — K50919 Crohn's disease, unspecified, with unspecified complications: Secondary | ICD-10-CM | POA: Diagnosis not present

## 2017-06-18 DIAGNOSIS — J441 Chronic obstructive pulmonary disease with (acute) exacerbation: Secondary | ICD-10-CM | POA: Diagnosis present

## 2017-06-18 DIAGNOSIS — N179 Acute kidney failure, unspecified: Secondary | ICD-10-CM | POA: Diagnosis present

## 2017-06-18 DIAGNOSIS — I5021 Acute systolic (congestive) heart failure: Secondary | ICD-10-CM | POA: Diagnosis not present

## 2017-06-18 DIAGNOSIS — I82409 Acute embolism and thrombosis of unspecified deep veins of unspecified lower extremity: Secondary | ICD-10-CM | POA: Diagnosis not present

## 2017-06-18 DIAGNOSIS — D62 Acute posthemorrhagic anemia: Secondary | ICD-10-CM | POA: Diagnosis present

## 2017-06-18 DIAGNOSIS — M7989 Other specified soft tissue disorders: Secondary | ICD-10-CM | POA: Diagnosis not present

## 2017-06-18 DIAGNOSIS — I5031 Acute diastolic (congestive) heart failure: Secondary | ICD-10-CM | POA: Diagnosis not present

## 2017-06-18 DIAGNOSIS — I081 Rheumatic disorders of both mitral and tricuspid valves: Secondary | ICD-10-CM | POA: Diagnosis present

## 2017-06-18 DIAGNOSIS — K50911 Crohn's disease, unspecified, with rectal bleeding: Secondary | ICD-10-CM | POA: Diagnosis present

## 2017-06-18 DIAGNOSIS — E785 Hyperlipidemia, unspecified: Secondary | ICD-10-CM | POA: Diagnosis present

## 2017-06-18 DIAGNOSIS — R0602 Shortness of breath: Secondary | ICD-10-CM | POA: Diagnosis not present

## 2017-06-18 DIAGNOSIS — G610 Guillain-Barre syndrome: Secondary | ICD-10-CM | POA: Diagnosis present

## 2017-06-18 DIAGNOSIS — Z66 Do not resuscitate: Secondary | ICD-10-CM | POA: Diagnosis present

## 2017-06-18 DIAGNOSIS — I255 Ischemic cardiomyopathy: Secondary | ICD-10-CM | POA: Diagnosis not present

## 2017-06-18 DIAGNOSIS — K922 Gastrointestinal hemorrhage, unspecified: Secondary | ICD-10-CM | POA: Diagnosis not present

## 2017-06-18 DIAGNOSIS — R600 Localized edema: Secondary | ICD-10-CM | POA: Diagnosis not present

## 2017-06-18 DIAGNOSIS — J449 Chronic obstructive pulmonary disease, unspecified: Secondary | ICD-10-CM | POA: Diagnosis not present

## 2017-06-18 DIAGNOSIS — F17200 Nicotine dependence, unspecified, uncomplicated: Secondary | ICD-10-CM | POA: Diagnosis not present

## 2017-06-18 HISTORY — DX: Heart failure, unspecified: I50.9

## 2017-06-18 HISTORY — DX: Acute embolism and thrombosis of unspecified deep veins of unspecified lower extremity: I82.409

## 2017-06-18 LAB — URINALYSIS, COMPLETE (UACMP) WITH MICROSCOPIC
BACTERIA UA: NONE SEEN
BILIRUBIN URINE: NEGATIVE
GLUCOSE, UA: NEGATIVE mg/dL
HGB URINE DIPSTICK: NEGATIVE
Ketones, ur: NEGATIVE mg/dL
Nitrite: NEGATIVE
PROTEIN: NEGATIVE mg/dL
SPECIFIC GRAVITY, URINE: 1.006 (ref 1.005–1.030)
SQUAMOUS EPITHELIAL / LPF: NONE SEEN
pH: 5 (ref 5.0–8.0)

## 2017-06-18 LAB — TSH: TSH: 2.047 u[IU]/mL (ref 0.350–4.500)

## 2017-06-18 LAB — CBC
HEMATOCRIT: 30 % — AB (ref 35.0–47.0)
Hemoglobin: 9.8 g/dL — ABNORMAL LOW (ref 12.0–16.0)
MCH: 32 pg (ref 26.0–34.0)
MCHC: 32.7 g/dL (ref 32.0–36.0)
MCV: 97.7 fL (ref 80.0–100.0)
PLATELETS: 392 10*3/uL (ref 150–440)
RBC: 3.07 MIL/uL — ABNORMAL LOW (ref 3.80–5.20)
RDW: 22.5 % — ABNORMAL HIGH (ref 11.5–14.5)
WBC: 6.6 10*3/uL (ref 3.6–11.0)

## 2017-06-18 LAB — BASIC METABOLIC PANEL
Anion gap: 6 (ref 5–15)
BUN: 10 mg/dL (ref 6–20)
CO2: 27 mmol/L (ref 22–32)
Calcium: 9.1 mg/dL (ref 8.9–10.3)
Chloride: 108 mmol/L (ref 101–111)
Creatinine, Ser: 1.06 mg/dL — ABNORMAL HIGH (ref 0.44–1.00)
GFR calc Af Amer: 53 mL/min — ABNORMAL LOW (ref >60)
GFR, EST NON AFRICAN AMERICAN: 46 mL/min — AB (ref >60)
GLUCOSE: 113 mg/dL — AB (ref 65–99)
POTASSIUM: 4.8 mmol/L (ref 3.5–5.1)
Sodium: 141 mmol/L (ref 135–145)

## 2017-06-18 LAB — TROPONIN I
TROPONIN I: 0.24 ng/mL — AB (ref <0.03)
TROPONIN I: 0.23 ng/mL — AB (ref <0.03)
Troponin I: 0.2 ng/mL (ref <0.03)

## 2017-06-18 LAB — PROTIME-INR
INR: 1.03
Prothrombin Time: 13.5 seconds (ref 11.4–15.2)

## 2017-06-18 LAB — BRAIN NATRIURETIC PEPTIDE: B Natriuretic Peptide: 2670 pg/mL — ABNORMAL HIGH (ref 0.0–100.0)

## 2017-06-18 MED ORDER — ONDANSETRON HCL 4 MG/2ML IJ SOLN: 4.0000 mg | Freq: Four times a day (QID) | INTRAMUSCULAR | Status: DC | PRN

## 2017-06-18 MED ORDER — SODIUM CHLORIDE 0.9% FLUSH: 3.0000 mL | INTRAVENOUS | Status: DC | PRN

## 2017-06-18 MED ORDER — IPRATROPIUM-ALBUTEROL 0.5-2.5 (3) MG/3ML IN SOLN
3.0000 mL | Freq: Once | RESPIRATORY_TRACT | Status: AC
  Administered 2017-06-18: 3 mL via RESPIRATORY_TRACT
  Filled 2017-06-18: qty 3

## 2017-06-18 MED ORDER — FUROSEMIDE 10 MG/ML IJ SOLN
40.0000 mg | Freq: Once | INTRAMUSCULAR | Status: AC
  Administered 2017-06-18: 40 mg via INTRAVENOUS
  Filled 2017-06-18: qty 4

## 2017-06-18 MED ORDER — PANTOPRAZOLE SODIUM 40 MG PO TBEC: 40.0000 mg | DELAYED_RELEASE_TABLET | Freq: Every day | ORAL | Status: DC | PRN

## 2017-06-18 MED ORDER — ORAL CARE MOUTH RINSE
15.0000 mL | Freq: Two times a day (BID) | OROMUCOSAL | Status: DC
  Administered 2017-06-18 – 2017-06-23 (×9): 15 mL via OROMUCOSAL

## 2017-06-18 MED ORDER — BUDESONIDE 0.25 MG/2ML IN SUSP
0.2500 mg | Freq: Two times a day (BID) | RESPIRATORY_TRACT | Status: DC
  Administered 2017-06-19 – 2017-06-24 (×11): 0.25 mg via RESPIRATORY_TRACT
  Filled 2017-06-18 (×12): qty 2

## 2017-06-18 MED ORDER — FUROSEMIDE 10 MG/ML IJ SOLN
20.0000 mg | Freq: Every day | INTRAMUSCULAR | Status: DC
  Administered 2017-06-19: 20 mg via INTRAVENOUS
  Filled 2017-06-18: qty 2

## 2017-06-18 MED ORDER — OMEGA-3-ACID ETHYL ESTERS 1 G PO CAPS
1.0000 g | ORAL_CAPSULE | Freq: Every day | ORAL | Status: DC
  Administered 2017-06-19 – 2017-06-22 (×4): 1 g via ORAL
  Filled 2017-06-18 (×4): qty 1

## 2017-06-18 MED ORDER — ADULT MULTIVITAMIN W/MINERALS CH
1.0000 | ORAL_TABLET | Freq: Every day | ORAL | Status: DC
  Administered 2017-06-19 – 2017-06-24 (×6): 1 via ORAL
  Filled 2017-06-18 (×6): qty 1

## 2017-06-18 MED ORDER — IPRATROPIUM-ALBUTEROL 0.5-2.5 (3) MG/3ML IN SOLN
3.0000 mL | RESPIRATORY_TRACT | Status: DC | PRN
  Administered 2017-06-21: 3 mL via RESPIRATORY_TRACT
  Filled 2017-06-18 (×2): qty 3

## 2017-06-18 MED ORDER — LORATADINE 10 MG PO TABS: 10.0000 mg | ORAL_TABLET | Freq: Every day | ORAL | Status: DC | PRN

## 2017-06-18 MED ORDER — DOCUSATE SODIUM 100 MG PO CAPS
100.0000 mg | ORAL_CAPSULE | Freq: Two times a day (BID) | ORAL | Status: DC
  Administered 2017-06-18 – 2017-06-23 (×9): 100 mg via ORAL
  Filled 2017-06-18 (×9): qty 1

## 2017-06-18 MED ORDER — TECHNETIUM TO 99M ALBUMIN AGGREGATED
4.3700 | Freq: Once | INTRAVENOUS | Status: AC | PRN
  Administered 2017-06-18: 4.37 via INTRAVENOUS

## 2017-06-18 MED ORDER — ONDANSETRON HCL 4 MG PO TABS: 4.0000 mg | ORAL_TABLET | Freq: Four times a day (QID) | ORAL | Status: DC | PRN

## 2017-06-18 MED ORDER — GABAPENTIN 100 MG PO CAPS
200.0000 mg | ORAL_CAPSULE | Freq: Every day | ORAL | Status: DC
  Administered 2017-06-18 – 2017-06-23 (×6): 200 mg via ORAL
  Filled 2017-06-18 (×6): qty 2

## 2017-06-18 MED ORDER — SODIUM CHLORIDE 0.9% FLUSH
3.0000 mL | Freq: Two times a day (BID) | INTRAVENOUS | Status: DC
  Administered 2017-06-18 – 2017-06-20 (×4): 3 mL via INTRAVENOUS

## 2017-06-18 MED ORDER — METHYLPREDNISOLONE SODIUM SUCC 125 MG IJ SOLR
125.0000 mg | Freq: Once | INTRAMUSCULAR | Status: AC
  Administered 2017-06-18: 125 mg via INTRAVENOUS
  Filled 2017-06-18: qty 2

## 2017-06-18 MED ORDER — MESALAMINE 1.2 G PO TBEC
2.4000 g | DELAYED_RELEASE_TABLET | Freq: Every day | ORAL | Status: DC
  Administered 2017-06-19 – 2017-06-24 (×5): 2.4 g via ORAL
  Filled 2017-06-18 (×7): qty 2

## 2017-06-18 MED ORDER — GUAIFENESIN ER 600 MG PO TB12
600.0000 mg | ORAL_TABLET | Freq: Two times a day (BID) | ORAL | Status: DC
  Administered 2017-06-18 – 2017-06-20 (×5): 600 mg via ORAL
  Filled 2017-06-18 (×5): qty 1

## 2017-06-18 MED ORDER — NICOTINE 21 MG/24HR TD PT24
21.0000 mg | MEDICATED_PATCH | Freq: Every day | TRANSDERMAL | Status: DC
  Administered 2017-06-18 – 2017-06-24 (×7): 21 mg via TRANSDERMAL
  Filled 2017-06-18 (×7): qty 1

## 2017-06-18 MED ORDER — IPRATROPIUM-ALBUTEROL 0.5-2.5 (3) MG/3ML IN SOLN
3.0000 mL | Freq: Four times a day (QID) | RESPIRATORY_TRACT | Status: DC
  Administered 2017-06-19 – 2017-06-20 (×8): 3 mL via RESPIRATORY_TRACT
  Filled 2017-06-18 (×9): qty 3

## 2017-06-18 MED ORDER — IPRATROPIUM-ALBUTEROL 0.5-2.5 (3) MG/3ML IN SOLN: 3.0000 mL | Freq: Four times a day (QID) | RESPIRATORY_TRACT | Status: DC

## 2017-06-18 MED ORDER — FOLIC ACID 1 MG PO TABS
1.0000 mg | ORAL_TABLET | Freq: Every day | ORAL | Status: DC
  Administered 2017-06-19 – 2017-06-24 (×6): 1 mg via ORAL
  Filled 2017-06-18 (×6): qty 1

## 2017-06-18 MED ORDER — ACETAMINOPHEN 650 MG RE SUPP: 650.0000 mg | Freq: Four times a day (QID) | RECTAL | Status: DC | PRN

## 2017-06-18 MED ORDER — ACETAMINOPHEN 325 MG PO TABS
650.0000 mg | ORAL_TABLET | Freq: Four times a day (QID) | ORAL | Status: DC | PRN
  Administered 2017-06-19 – 2017-06-20 (×2): 650 mg via ORAL
  Filled 2017-06-18 (×2): qty 2

## 2017-06-18 MED ORDER — FERROUS SULFATE 325 (65 FE) MG PO TABS
325.0000 mg | ORAL_TABLET | Freq: Every day | ORAL | Status: DC
  Administered 2017-06-19 – 2017-06-24 (×5): 325 mg via ORAL
  Filled 2017-06-18 (×6): qty 1

## 2017-06-18 MED ORDER — IPRATROPIUM-ALBUTEROL 0.5-2.5 (3) MG/3ML IN SOLN: 3.0000 mL | RESPIRATORY_TRACT | Status: DC

## 2017-06-18 MED ORDER — SODIUM CHLORIDE 0.9 % IV SOLN: 250.0000 mL | INTRAVENOUS | Status: DC | PRN

## 2017-06-18 MED ORDER — NITROGLYCERIN 2 % TD OINT
0.5000 [in_us] | TOPICAL_OINTMENT | Freq: Three times a day (TID) | TRANSDERMAL | Status: DC
  Administered 2017-06-18 – 2017-06-19 (×4): 0.5 [in_us] via TOPICAL
  Filled 2017-06-18 (×5): qty 1

## 2017-06-18 MED ORDER — GABAPENTIN 100 MG PO CAPS
100.0000 mg | ORAL_CAPSULE | Freq: Every day | ORAL | Status: DC
  Administered 2017-06-19 – 2017-06-24 (×5): 100 mg via ORAL
  Filled 2017-06-18 (×6): qty 1

## 2017-06-18 MED ORDER — ZOLPIDEM TARTRATE 5 MG PO TABS
5.0000 mg | ORAL_TABLET | Freq: Every evening | ORAL | Status: DC | PRN
  Administered 2017-06-18 – 2017-06-23 (×5): 5 mg via ORAL
  Filled 2017-06-18 (×5): qty 1

## 2017-06-18 MED ORDER — SENNA 8.6 MG PO TABS
1.0000 | ORAL_TABLET | Freq: Two times a day (BID) | ORAL | Status: DC
  Administered 2017-06-18 – 2017-06-24 (×9): 8.6 mg via ORAL
  Filled 2017-06-18 (×9): qty 1

## 2017-06-18 MED ORDER — METOPROLOL TARTRATE 25 MG PO TABS
12.5000 mg | ORAL_TABLET | Freq: Two times a day (BID) | ORAL | Status: DC
  Administered 2017-06-18 – 2017-06-22 (×5): 12.5 mg via ORAL
  Filled 2017-06-18 (×8): qty 1

## 2017-06-18 MED ORDER — TECHNETIUM TC 99M DIETHYLENETRIAME-PENTAACETIC ACID: 32.2500 | Freq: Once | INTRAVENOUS | Status: DC | PRN

## 2017-06-18 NOTE — ED Notes (Signed)
Pt placed on bedpan. Stated she takes a while to urinate. Given call bell for when she is finished.

## 2017-06-18 NOTE — ED Provider Notes (Signed)
[facility] Emergency Department Provider Note  ____________________________________________  Time seen: Approximately 12:31 PM  I have reviewed the triage vital signs and the nursing notes.   HISTORY  Chief Complaint Shortness of Breath and Leg Swelling   HPI Lindsey Hobbs is a 83 y.o. female with a history of Crohn's disease, COPD, smoking, and hypertension who presents for evaluation of shortness of breath. Patient was discharged a week ago after GI. For the last 2-3 days patient has had progressively worsening shortness of breath, orthopnea, and bilateral lower extremity edema. No prior history of heart failure. Shortness of breath is worse with minimal exertion and laying flat. Patient denies chest pain.Denies prior history of DVT. She is not on any blood thinners due to recent GI bleed. She reports that her shortness of breath is moderate at rest and severe with minimal exertion.  Past Medical History:  Diagnosis Date  . Anemia   . Avascular necrosis of bone of hip ([place])   . COPD (chronic obstructive pulmonary disease) ([place])   . Crohn's disease ([place])   . Guillain Barr syndrome ([place])   . Hyperlipidemia   . Hypertension     Patient Active Problem List   Diagnosis Date Noted  . Ulcerative colitis ([place])   . Anemia 06/04/2017  . Rectal bleed 06/02/2017  . GI bleed 07/05/2015    Past Surgical History:  Procedure Laterality Date  . ABDOMINAL HYSTERECTOMY    . COLONOSCOPY WITH PROPOFOL Left 07/07/2015   Procedure: COLONOSCOPY WITH PROPOFOL;  Surgeon: Hulen Luster, MD;  Location: [facility] ENDOSCOPY;  Service: Endoscopy;  Laterality: Left;  . FLEXIBLE SIGMOIDOSCOPY N/A 06/05/2017   Procedure: FLEXIBLE SIGMOIDOSCOPY;  Surgeon: Jonathon Bellows, MD;  Location: [facility] ENDOSCOPY;  Service: Gastroenterology;  Laterality: N/A;    Prior to Admission medications   Medication Sig Start Date End Date Taking? Authorizing Provider  Calcium Carbonate-Vitamin D (CALCIUM  600+D) 600-400 MG-UNIT per tablet Take 1 tablet by mouth 2 (two) times daily.   Yes [provider]  ferrous sulfate 325 (65 FE) MG tablet Take 1 tablet (325 mg total) by mouth 2 (two) times daily with a meal. Patient taking differently: Take 325 mg by mouth daily with breakfast.  07/08/15  Yes Sudini, Alveta Heimlich, MD  fexofenadine (ALLEGRA) 180 MG tablet Take 180 mg by mouth at bedtime.   Yes [provider]  folic acid (FOLVITE) 1 MG tablet Take 1 mg by mouth daily.   Yes [provider]  gabapentin (NEURONTIN) 100 MG capsule Take 100-300 mg by mouth See admin instructions. One in the morning and 2 at night   Yes [provider]  ipratropium-albuterol (DUONEB) 0.5-2.5 (3) MG/3ML SOLN Take 3 mLs by nebulization every 6 (six) hours as needed. 06/06/17  Yes Gladstone Lighter, MD  loratadine (CLARITIN) 10 MG tablet Take 10 mg by mouth daily as needed for allergies.   Yes [provider]  mesalamine (LIALDA) 1.2 g EC tablet Take 2.4 g by mouth daily with breakfast.   Yes [provider]  Multiple Vitamin (MULTIVITAMIN WITH MINERALS) TABS tablet Take 1 tablet by mouth daily.   Yes [provider]  Omega-3 Fatty Acids (FISH OIL PO) Take 1 capsule by mouth 3 (three) times daily.   Yes [provider]  pantoprazole (PROTONIX) 40 MG tablet Take 40 mg by mouth daily. 05/13/17  Yes [provider]  zolpidem (AMBIEN) 10 MG tablet Take 10 mg by mouth at bedtime as needed for sleep.  Yes [provider]  guaiFENesin (MUCINEX) 600 MG 12 hr tablet Take 1 tablet (600 mg total) by mouth 2 (two) times daily. Patient not taking: Reported on 06/18/2017 06/06/17   Gladstone Lighter, MD  predniSONE (DELTASONE) 10 MG tablet Take 5 tablets q daily for 5 days and then 4 tablets q daily for 5 days and 3 tablets q daily for 5 days and 2 tablets q daily for 5 days and then 1 tablet q daily for 5 days and stop Patient not taking: Reported on 06/18/2017  06/06/17   Gladstone Lighter, MD    Allergies Patient has no known allergies.  Family History  Problem Relation Age of Onset  . Breast cancer Mother     Social History Social History  Substance Use Topics  . Smoking status: Current Every Day Smoker    Packs/day: 0.50  . Smokeless tobacco: Never Used  . Alcohol use No    Review of Systems  Constitutional: Negative for fever. Eyes: Negative for visual changes. ENT: Negative for sore throat. Neck: No neck pain  Cardiovascular: Negative for chest pain. Respiratory: + shortness of breath. Gastrointestinal: Negative for abdominal pain, vomiting or diarrhea. Genitourinary: Negative for dysuria. Musculoskeletal: Negative for back pain. + b/l leg swelling Skin: Negative for rash. Neurological: Negative for headaches, weakness or numbness. Psych: No SI or HI  ____________________________________________   PHYSICAL EXAM:  VITAL SIGNS: ED Triage Vitals  Enc Vitals Group     BP 06/18/17 1133 (!) 122/51     Pulse Rate 06/18/17 1133 86     Resp 06/18/17 1133 16     Temp 06/18/17 1135 97.7 F (36.5 C)     Temp Source 06/18/17 1133 Oral     SpO2 06/18/17 1133 98 %     Weight 06/18/17 1134 127 lb (57.6 kg)     Height 06/18/17 1134 5\' 4"  (1.626 m)     Head Circumference --      Peak Flow --      Pain Score --      Pain Loc --      Pain Edu? --      Excl. in [place]? --     Constitutional: Alert and oriented, mild respiratory distress.  HEENT:      Head: Normocephalic and atraumatic.         Eyes: Conjunctivae are normal. Sclera is non-icteric.       Mouth/Throat: Mucous membranes are moist.       Neck: Supple with no signs of meningismus. Cardiovascular: Regular rate and rhythm. No murmurs, gallops, or rubs. 2+ symmetrical distal pulses are present in all extremities. No JVD. Respiratory: Increased WOB, hypoxic on room air, decrease air movement bilaterally with crackles on bilateral bases and faint expiratory  wheezes Gastrointestinal: Soft, non tender, and non distended with positive bowel sounds. No rebound or guarding. Musculoskeletal: 2+ pitting edema bilateral lower extremity to the knees Neurologic: Normal speech and language. Face is symmetric. Moving all extremities. No gross focal neurologic deficits are appreciated. Skin: Skin is warm, dry and intact. No rash noted. Psychiatric: Mood and affect are normal. Speech and behavior are normal.  ____________________________________________   LABS (all labs ordered are listed, but only abnormal results are displayed)  Labs Reviewed  BASIC METABOLIC PANEL - Abnormal; Notable for the following:       Result Value   Glucose, Bld 113 (*)    Creatinine, Ser 1.06 (*)    GFR calc non Af Amer 46 (*)  GFR calc Af Amer 53 (*)    All other components within normal limits  CBC - Abnormal; Notable for the following:    RBC 3.07 (*)    Hemoglobin 9.8 (*)    HCT 30.0 (*)    RDW 22.5 (*)    All other components within normal limits  TROPONIN I - Abnormal; Notable for the following:    Troponin I 0.24 (*)    All other components within normal limits  BRAIN NATRIURETIC PEPTIDE - Abnormal; Notable for the following:    B Natriuretic Peptide 2,670.0 (*)    All other components within normal limits   ____________________________________________  EKG  ED ECG REPORT I, Rudene Re, the attending physician, personally viewed and interpreted this ECG.  Normal sinus rhythm, rate of 92, normal intervals, normal axis, no ST elevations, T-wave inversions in inferior leads which is new when compared to prior from July 2018 ____________________________________________  RADIOLOGY  CXR: COPD. Cardiomegaly, vascular congestion. Small effusions.   Doppler: 1. 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 2. No additional deep venous thrombosis in either extremity. These results will be called to the ordering  clinician or representative by the Radiologist Assistant, and communication documented in the PACS or zVision Dashboard. ____________________________________________   PROCEDURES  Procedure(s) performed: None Procedures Critical Care performed: yes  CRITICAL CARE Performed by: Rudene Re  ?  Total critical care time: 40 min  Critical care time was exclusive of separately billable procedures and treating other patients.  Critical care was necessary to treat or prevent imminent or life-threatening deterioration.  Critical care was time spent personally by me on the following activities: development of treatment plan with patient and/or surrogate as well as nursing, discussions with consultants, evaluation of patient's response to treatment, examination of patient, obtaining history from patient or surrogate, ordering and performing treatments and interventions, ordering and review of laboratory studies, ordering and review of radiographic studies, pulse oximetry and re-evaluation of patient's condition.  ____________________________________________   INITIAL IMPRESSION / ASSESSMENT AND PLAN / ED COURSE   83 y.o. female with a history of Crohn's disease, COPD, smoking, and hypertension who presents for evaluation of shortness of breath, orthopnea, bilateral lower extremity edema. Patient is in mild  Respiratory distress,with crackles and wheezing, bilateral lower extremity edema. Presentation concerning for acute respiratory failure in the setting of COPD and CHF exacerbation. Patient with no prior history of CHF. Will start patient on DuoNeb labs, Solu-Medrol, Lasix. We'll get Dopplers of her bilateral lower extremities to rule out DVT since patient has been recently admitted to the hospital and has not been on heparin due to GIB. Anticipate admission to the hospital    _________________________ 2:50 PM on 06/18/2017 -----------------------------------------  Patient found to  have new CHF with pulmonary edema, BNP of 2600, and troponin of 0.24. Patient also with acute kidney injury. Has new DVT seen on Doppler studies. Patient will need VQ scan to rule out PE. No heparin started due to recent GIB. Will need IV filter. Has been given lasix and will be admitted to the Hospitalist service.   Pertinent labs & imaging results that were available during my care of the patient were reviewed by me and considered in my medical decision making (see chart for details).    ____________________________________________   FINAL CLINICAL IMPRESSION(S) / ED DIAGNOSES  Final diagnoses:  Acute respiratory failure with hypoxia (HCC)  COPD exacerbation (HCC)  Acute on chronic congestive heart failure, unspecified heart  failure type ([place])  Acute kidney injury ([place])  Demand ischemia of myocardium ([place])  Acute deep vein thrombosis (DVT) of other specified vein of right lower extremity (HCC)      NEW MEDICATIONS STARTED DURING THIS VISIT:  New Prescriptions   No medications on file     Note:  This document was prepared using Dragon voice recognition software and may include unintentional dictation errors.    Rudene Re, MD 06/18/17 [phone]

## 2017-06-18 NOTE — ED Notes (Signed)
Pt taken to xray via stretcher  

## 2017-06-18 NOTE — ED Triage Notes (Signed)
C/O difficulty breathing and leg swelling since Sunday.  Patient's son states that patient was sent home with a nebulizer, which patient has been using.  Recently hospitalized for Chron's exacerbation and required blood transfusion.  Discharged home last Thursday.

## 2017-06-18 NOTE — ED Notes (Signed)
Ox Sat 95% on 2L

## 2017-06-18 NOTE — ED Notes (Signed)
Admitting at bedside 

## 2017-06-18 NOTE — ED Notes (Signed)
Pt was dc from hospital after a crohns flare up with 1 unit blood transfusion for bloody stool. Pt states dc on Thursday. States Sunday started with bilat lower leg swelling and SOB while lying and moving. Pt does NOT wear oxygen at home. Has smoked since her 36s, half a pack a day. Denies asthma, denies knowing about COPD. Denies CHF. Shallow breathing, on 2 L nasal cannula for hypoxia in triage. Alert and oriented. Family at bedside.

## 2017-06-18 NOTE — ED Notes (Signed)
Pulse ox 86% on RA, placed on O2 2 L.

## 2017-06-18 NOTE — ED Notes (Signed)
Pt could not urinate on bedpan. Got pt up to toilet, ambulated well. Stayed on oxygen while on toilet. Urinated in toilet.

## 2017-06-18 NOTE — H&P (Addendum)
[place] [facility] [place] NAME: Lindsey Hobbs    MR#:  [id number]  DATE OF BIRTH:  June 29, 1927  DATE OF ADMISSION:  06/18/2017  PRIMARY CARE PHYSICIAN: Cletis Athens, MD   REQUESTING/REFERRING PHYSICIAN:   CHIEF COMPLAINT:   Chief Complaint  Patient presents with  . Shortness of Breath  . Leg Swelling    HISTORY OF PRESENT ILLNESS: Lindsey Hobbs  is a 83 y.o. female with a known history of Recent admission for acute posthemorrhagic anemia due to gastrointestinal bleed, status post colonoscopy 06/05/2017 by Dr. Vicente Males, revealing mucosal ulceration, sigmoid stricture, blood in colon, but not actively bleeding, which was felt to be related to Crohn's disease, COPD, ongoing tobacco abuse, hypertension, hyperlipidemia, who presents to the hospital with complaints of shortness of breath for the past 1-2 weeks. Her oxygen saturation and emergency room was 86% on room air. She was placed on 2 L of oxygen through nasal cannula. She also complained of lower extremity swelling. Doppler ultrasound of bilateral lower extremities revealed the right lower extremity DVT. Labs showed elevated troponin. VQ scan is pending. Hospitalist services were contacted for admission. Chest x-ray was concerning for acute CHF with new pleural effusions, cardiomegaly and vascular congestion.  PAST MEDICAL HISTORY:   Past Medical History:  Diagnosis Date  . Anemia   . Avascular necrosis of bone of hip ([place])   . COPD (chronic obstructive pulmonary disease) ([place])   . Crohn's disease ([place])   . Guillain Barr syndrome ([place])   . Hyperlipidemia   . Hypertension     PAST SURGICAL HISTORY: Past Surgical History:  Procedure Laterality Date  . ABDOMINAL HYSTERECTOMY    . COLONOSCOPY WITH PROPOFOL Left 07/07/2015   Procedure: COLONOSCOPY WITH PROPOFOL;  Surgeon: Hulen Luster, MD;  Location: [facility] ENDOSCOPY;  Service: Endoscopy;  Laterality: Left;  . FLEXIBLE SIGMOIDOSCOPY N/A 06/05/2017    Procedure: FLEXIBLE SIGMOIDOSCOPY;  Surgeon: Jonathon Bellows, MD;  Location: [facility] ENDOSCOPY;  Service: Gastroenterology;  Laterality: N/A;    SOCIAL HISTORY:  Social History  Substance Use Topics  . Smoking status: Current Every Day Smoker    Packs/day: 0.50  . Smokeless tobacco: Never Used  . Alcohol use No    FAMILY HISTORY:  Family History  Problem Relation Age of Onset  . Breast cancer Mother     DRUG ALLERGIES: No Known Allergies  Review of Systems  Constitutional: Positive for malaise/fatigue. Negative for chills, fever and weight loss.  HENT: Negative for congestion.   Eyes: Negative for blurred vision and double vision.  Respiratory: Positive for cough, sputum production, shortness of breath and wheezing.   Cardiovascular: Positive for orthopnea and leg swelling. Negative for chest pain, palpitations and PND.  Gastrointestinal: Positive for constipation. Negative for abdominal pain, blood in stool, diarrhea, nausea and vomiting.  Genitourinary: Negative for dysuria, frequency, hematuria and urgency.  Musculoskeletal: Negative for falls.  Neurological: Positive for weakness. Negative for dizziness, tremors, focal weakness and headaches.  Endo/Heme/Allergies: Does not bruise/bleed easily.  Psychiatric/Behavioral: Negative for depression. The patient does not have insomnia.     MEDICATIONS AT HOME:  Prior to Admission medications   Medication Sig Start Date End Date Taking? Authorizing Provider  Calcium Carbonate-Vitamin D (CALCIUM 600+D) 600-400 MG-UNIT per tablet Take 1 tablet by mouth 2 (two) times daily.   Yes [provider]  ferrous sulfate 325 (65 FE) MG tablet Take 1 tablet (325 mg total) by mouth 2 (two) times daily with a  meal. Patient taking differently: Take 325 mg by mouth daily with breakfast.  07/08/15  Yes Sudini, Alveta Heimlich, MD  fexofenadine (ALLEGRA) 180 MG tablet Take 180 mg by mouth at bedtime.   Yes [provider]  folic acid (FOLVITE) 1  MG tablet Take 1 mg by mouth daily.   Yes [provider]  gabapentin (NEURONTIN) 100 MG capsule Take 100-300 mg by mouth See admin instructions. One in the morning and 2 at night   Yes [provider]  ipratropium-albuterol (DUONEB) 0.5-2.5 (3) MG/3ML SOLN Take 3 mLs by nebulization every 6 (six) hours as needed. 06/06/17  Yes Gladstone Lighter, MD  loratadine (CLARITIN) 10 MG tablet Take 10 mg by mouth daily as needed for allergies.   Yes [provider]  mesalamine (LIALDA) 1.2 g EC tablet Take 2.4 g by mouth daily with breakfast.   Yes [provider]  Multiple Vitamin (MULTIVITAMIN WITH MINERALS) TABS tablet Take 1 tablet by mouth daily.   Yes [provider]  Omega-3 Fatty Acids (FISH OIL PO) Take 1 capsule by mouth 3 (three) times daily.   Yes [provider]  pantoprazole (PROTONIX) 40 MG tablet Take 40 mg by mouth daily. 05/13/17  Yes [provider]  zolpidem (AMBIEN) 10 MG tablet Take 10 mg by mouth at bedtime as needed for sleep.   Yes [provider]  guaiFENesin (MUCINEX) 600 MG 12 hr tablet Take 1 tablet (600 mg total) by mouth 2 (two) times daily. Patient not taking: Reported on 06/18/2017 06/06/17   Gladstone Lighter, MD  predniSONE (DELTASONE) 10 MG tablet Take 5 tablets q daily for 5 days and then 4 tablets q daily for 5 days and 3 tablets q daily for 5 days and 2 tablets q daily for 5 days and then 1 tablet q daily for 5 days and stop Patient not taking: Reported on 06/18/2017 06/06/17   Gladstone Lighter, MD      PHYSICAL EXAMINATION:   VITAL SIGNS: Blood pressure (!) 146/66, pulse 98, temperature 97.7 F (36.5 C), resp. rate (!) 24, height 5\' 4"  (1.626 m), weight 57.6 kg (127 lb), SpO2 91 %.  GENERAL:  83 y.o.-year-old patient lying in the bed with no acute distress.  EYES: Pupils equal, round, reactive to light and accommodation. No scleral icterus. Extraocular muscles intact.  HEENT: Head atraumatic,  normocephalic. Oropharynx and nasopharynx clear.  NECK:  Supple, no jugular venous distention. No thyroid enlargement, no tenderness.  LUNGS: Normal breath sounds bilaterally, no wheezing, rales,rhonchi or crepitation. No use of accessory muscles of respiration.  CARDIOVASCULAR: S1, S2 normal. No murmurs, rubs, or gallops.  ABDOMEN: Soft, nontender, nondistended. Bowel sounds present. No organomegaly or mass.  EXTREMITIES: 2+ lower extremity and pedal edema bilateral, more pronounced on the right side, no significant calf tenderness bilaterally, no cyanosis, or clubbing.  NEUROLOGIC: Cranial nerves II through XII are intact. Muscle strength 5/5 in all extremities. Sensation intact. Gait not checked.  PSYCHIATRIC: The patient is alert and oriented x 3.  SKIN: No obvious rash, lesion, or ulcer.   LABORATORY PANEL:   CBC  Recent Labs Lab 06/18/17 1140  WBC 6.6  HGB 9.8*  HCT 30.0*  PLT 392  MCV 97.7  MCH 32.0  MCHC 32.7  RDW 22.5*   ------------------------------------------------------------------------------------------------------------------  Chemistries   Recent Labs Lab 06/18/17 1140  NA 141  K 4.8  CL 108  CO2 27  GLUCOSE 113*  BUN 10  CREATININE 1.06*  CALCIUM 9.1   ------------------------------------------------------------------------------------------------------------------  Cardiac Enzymes  Recent Labs Lab 06/18/17 1140  TROPONINI 0.24*   ------------------------------------------------------------------------------------------------------------------  RADIOLOGY: Dg Chest 2 View  Result Date: 06/18/2017 CLINICAL DATA:  Shortness of Breath EXAM: CHEST  2 VIEW COMPARISON:  06/05/2017 FINDINGS: There is hyperinflation of the lungs compatible with COPD. Mild cardiomegaly and vascular congestion. No overt edema. Small bilateral effusions. No acute bony abnormality. Severe compression fracture in the midthoracic spine, stable. IMPRESSION: COPD.  Cardiomegaly, vascular congestion. Small effusions. Electronically Signed   By: Rolm Baptise M.D.   On: 06/18/2017 14:35   US Venous Img Lower Bilateral  Result Date: 06/18/2017 CLINICAL DATA:  Bilateral leg swelling and shortness of breath. EXAM: BILATERAL LOWER EXTREMITY VENOUS DOPPLER ULTRASOUND TECHNIQUE: Gray-scale sonography with graded compression, as well as color Doppler and duplex ultrasound were performed to evaluate the lower extremity deep venous systems from the level of the common femoral vein and including the common femoral, femoral, profunda femoral, popliteal and calf veins including the posterior tibial, peroneal and gastrocnemius veins when visible. The superficial great saphenous vein was also interrogated. Spectral Doppler was utilized to evaluate flow at rest and with distal augmentation maneuvers in the common femoral, femoral and popliteal veins. COMPARISON:  None. FINDINGS: RIGHT LOWER EXTREMITY Common Femoral Vein: No evidence of thrombus. Normal compressibility, respiratory phasicity and response to augmentation. Saphenofemoral Junction: No evidence of thrombus. Normal compressibility and flow on color Doppler imaging. Profunda Femoral Vein: No evidence of thrombus. Normal compressibility and flow on color Doppler imaging. Femoral Vein: No evidence of thrombus. Normal compressibility, respiratory phasicity and response to augmentation. Popliteal Vein: No evidence of thrombus. Normal compressibility, respiratory phasicity and response to augmentation. Calf Veins: Duplicated right posterior tibial vein with occlusive thrombus in one of them. Normal compressibility and flow on color Doppler imaging in the remaining calf veins. Superficial Great Saphenous Vein: No evidence of thrombus. Normal compressibility and flow on color Doppler imaging. Venous Reflux:  None. Other Findings:  None. LEFT LOWER EXTREMITY Common Femoral Vein: No evidence of thrombus. Normal compressibility, respiratory  phasicity and response to augmentation. Saphenofemoral Junction: No evidence of thrombus. Normal compressibility and flow on color Doppler imaging. Profunda Femoral Vein: No evidence of thrombus. Normal compressibility and flow on color Doppler imaging. Femoral Vein: No evidence of thrombus. Normal compressibility, respiratory phasicity and response to augmentation. Popliteal Vein: No evidence of thrombus. Normal compressibility, respiratory phasicity and response to augmentation. Calf Veins: No evidence of thrombus. Normal compressibility and flow on color Doppler imaging. Superficial Great Saphenous Vein: No evidence of thrombus. Normal compressibility and flow on color Doppler imaging. Venous Reflux:  None. Other Findings:  None. IMPRESSION: 1. 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 2. No additional deep venous thrombosis in either extremity. These results will be called to the ordering clinician or representative by the Radiologist Assistant, and communication documented in the PACS or zVision Dashboard. Electronically Signed   By: Titus Dubin M.D.   On: 06/18/2017 14:38    EKG: Orders placed or performed during the hospital encounter of 06/18/17  . ED EKG  . ED EKG  . EKG 12-Lead  . EKG 12-Lead  EKG in the emergency room revealed sinus rhythm at 92 beats per minutes. Probable left atrial enlargement, poor R-wave progression in V3, nonspecific ST-T changes  IMPRESSION AND PLAN:  Active Problems:   Chronic deep vein thrombosis (DVT) of right lower extremity (HCC)   Dyspnea   Acute renal insufficiency   Demand ischemia ([place])  Tobacco abuse counseling   Right leg DVT ([place])  #1. Acute right lower extremity DVT, unable to use less as due to recent gastrointestinal bleed, getting vascular surgery involved for IVC filter placement, Discussed with Dr. Lucky Cowboy, vascular surgeon #2. Dyspnea, likely acute diastolic CHF, initiate patient on Lasix, oxygen, get  echocardiogram, continue budesonide, duo nebs, follow clinically. Get VQ scan to rule out pulmonary embolism. #3. Acute renal insufficiency, follow with diuretics, getting urinalysis #4 demand ischemia, follow cardiac enzymes 3, initiate low-dose of metoprolol, nitroglycerin topically, unable to use aspirin due to recent GI bleed, get cardiologist involved #5. Tobacco abuse counseling, discussed this patient for 4 minutes, nicotine replacement therapy is initiated. The patient is agreeable    All the records are reviewed and case discussed with ED provider. Management plans discussed with the patient, family and they are in agreement.  CODE STATUS:    Code Status Orders        Start     Ordered   06/18/17 1539  Do not attempt resuscitation (DNR)  Continuous    Question Answer Comment  In the event of cardiac or respiratory ARREST Do not call a "code blue"   In the event of cardiac or respiratory ARREST Do not perform Intubation, CPR, defibrillation or ACLS   In the event of cardiac or respiratory ARREST Use medication by any route, position, wound care, and other measures to relive pain and suffering. May use oxygen, suction and manual treatment of airway obstruction as needed for comfort.      06/18/17 1538    Code Status History    Date Active Date Inactive Code Status Order ID Comments User Context   06/01/2017  3:00 PM 06/06/2017  5:42 PM DNR [id number]  Baxter Hire, MD Inpatient   07/05/2015  5:33 PM 07/08/2015  8:17 PM DNR [id number]  Vaughan Basta, MD Inpatient    Advance Directive Documentation     Most Recent Value  Type of Advance Directive  Living will  Pre-existing out of facility DNR order (yellow form or pink MOST form)  -  "MOST" Form in Place?  -       TOTAL TIME TAKING CARE OF THIS PATIENT: 50 minutes.    Theodoro Grist M.D on 06/18/2017 at 3:44 PM  Between 7am to 6pm - Pager - [phone] After 6pm go to [email or web address] - password [email or web address] [facility]  Office  [phone]  CC: Primary care physician; Cletis Athens, MD

## 2017-06-18 NOTE — Progress Notes (Signed)
Attempted to adminster pt's breathing tx but she was away from her room for a procedure.

## 2017-06-18 NOTE — ED Notes (Signed)
Pt returned from [place] and needed to urinate. Helped to toilet, urinated, and helped back on stretcher. Stated she felt better after urinating.

## 2017-06-18 NOTE — Progress Notes (Signed)
Patient ID: RICHELE STRAND, female   DOB: [birth year], 83 y.o.   MRN: [id number]        Advance care planning:  Present. The patient Mrs. Jeni Salles DR Ether Griffins    Diagnoses:  acute right lower extremity DVT,  dyspnea, concerning for pulmonary embolism,  elevated troponin/demand ischemia,  acute renal insufficiency/dehydration,  tobacco abuse counseling,  acute respiratory failure with hypoxia Recent acute posthemorrhagic anemia requiring packed red blood cell transfusion/gastrointestinal bleed/Crohn's disease exacerbation Hypertension, hyperlipidemia, COPD   I discussed multiple above-cited medical problems with the patient Mrs. Noah Delaine, explained to her my concerns and treatment plan, we discussed CODE STATUS and patient decided to be DO NOT RESUSCITATE.  Time spent 16 minutes

## 2017-06-18 NOTE — ED Notes (Signed)
Assisted pt to toilet to urinate. Tolerated well.

## 2017-06-18 NOTE — ED Notes (Signed)
Pt taken to US via stretcher

## 2017-06-18 NOTE — ED Notes (Signed)
Date and time results received: 06/18/17 1219   Test: troponin Critical Value: 0.24  Name of Provider Notified: Dr. Alfred Levins

## 2017-06-19 ENCOUNTER — Encounter: Payer: Self-pay | Admitting: Internal Medicine

## 2017-06-19 DIAGNOSIS — R0603 Acute respiratory distress: Secondary | ICD-10-CM

## 2017-06-19 DIAGNOSIS — J441 Chronic obstructive pulmonary disease with (acute) exacerbation: Secondary | ICD-10-CM

## 2017-06-19 DIAGNOSIS — I10 Essential (primary) hypertension: Secondary | ICD-10-CM

## 2017-06-19 DIAGNOSIS — I82491 Acute embolism and thrombosis of other specified deep vein of right lower extremity: Secondary | ICD-10-CM

## 2017-06-19 DIAGNOSIS — K922 Gastrointestinal hemorrhage, unspecified: Secondary | ICD-10-CM

## 2017-06-19 DIAGNOSIS — I255 Ischemic cardiomyopathy: Secondary | ICD-10-CM

## 2017-06-19 DIAGNOSIS — R600 Localized edema: Secondary | ICD-10-CM

## 2017-06-19 DIAGNOSIS — I248 Other forms of acute ischemic heart disease: Secondary | ICD-10-CM

## 2017-06-19 DIAGNOSIS — R0602 Shortness of breath: Secondary | ICD-10-CM

## 2017-06-19 DIAGNOSIS — I5021 Acute systolic (congestive) heart failure: Secondary | ICD-10-CM

## 2017-06-19 DIAGNOSIS — D649 Anemia, unspecified: Secondary | ICD-10-CM

## 2017-06-19 DIAGNOSIS — I82409 Acute embolism and thrombosis of unspecified deep veins of unspecified lower extremity: Secondary | ICD-10-CM

## 2017-06-19 LAB — TROPONIN I
TROPONIN I: 0.23 ng/mL — AB (ref <0.03)
Troponin I: 0.21 ng/mL (ref <0.03)
Troponin I: 0.23 ng/mL (ref <0.03)

## 2017-06-19 LAB — BASIC METABOLIC PANEL
Anion gap: 8 (ref 5–15)
BUN: 15 mg/dL (ref 6–20)
CHLORIDE: 107 mmol/L (ref 101–111)
CO2: 31 mmol/L (ref 22–32)
CREATININE: 1.13 mg/dL — AB (ref 0.44–1.00)
Calcium: 8.9 mg/dL (ref 8.9–10.3)
GFR calc Af Amer: 49 mL/min — ABNORMAL LOW (ref >60)
GFR calc non Af Amer: 42 mL/min — ABNORMAL LOW (ref >60)
GLUCOSE: 125 mg/dL — AB (ref 65–99)
POTASSIUM: 3.9 mmol/L (ref 3.5–5.1)
SODIUM: 146 mmol/L — AB (ref 135–145)

## 2017-06-19 LAB — CBC
HEMATOCRIT: 26.1 % — AB (ref 35.0–47.0)
Hemoglobin: 8.3 g/dL — ABNORMAL LOW (ref 12.0–16.0)
MCH: 31.1 pg (ref 26.0–34.0)
MCHC: 31.9 g/dL — AB (ref 32.0–36.0)
MCV: 97.7 fL (ref 80.0–100.0)
PLATELETS: 362 10*3/uL (ref 150–440)
RBC: 2.67 MIL/uL — ABNORMAL LOW (ref 3.80–5.20)
RDW: 22.7 % — AB (ref 11.5–14.5)
WBC: 8.6 10*3/uL (ref 3.6–11.0)

## 2017-06-19 LAB — ECHOCARDIOGRAM COMPLETE
Height: 64 in
Weight: 2032 oz

## 2017-06-19 LAB — MISC LABCORP TEST (SEND OUT): Labcorp test code: 1453

## 2017-06-19 LAB — GLUCOSE, CAPILLARY: Glucose-Capillary: 99 mg/dL (ref 65–99)

## 2017-06-19 MED ORDER — FUROSEMIDE 10 MG/ML IJ SOLN
20.0000 mg | Freq: Two times a day (BID) | INTRAMUSCULAR | Status: DC
  Administered 2017-06-19 – 2017-06-24 (×9): 20 mg via INTRAVENOUS
  Filled 2017-06-19 (×10): qty 2

## 2017-06-19 MED ORDER — BACITRACIN-NEOMYCIN-POLYMYXIN OINTMENT TUBE
TOPICAL_OINTMENT | CUTANEOUS | Status: DC | PRN
  Administered 2017-06-19: 1 via TOPICAL
  Administered 2017-06-20: 14:00:00 via TOPICAL
  Administered 2017-06-23: 1 via TOPICAL
  Administered 2017-06-24: 10:00:00 via TOPICAL
  Filled 2017-06-19: qty 14.17

## 2017-06-19 NOTE — Progress Notes (Signed)
[facility] NAME: Lindsey Hobbs    MR#:  [id number]  DATE OF BIRTH:  1927/02/26  SUBJECTIVE:  CHIEF COMPLAINT:   Chief Complaint  Patient presents with  . Shortness of Breath  . Leg Swelling      Lives alone, have a " Girl coming home once a week to help with stuffs" Son lives in town, is power of attorney.   Recent GI bleed.    Came with SOB and leg swelling. No Chest pain.    Found to have CHF, RLE DVT.    On Supplemental oxygen, feels little better.  REVIEW OF SYSTEMS:  CONSTITUTIONAL: No fever,positive for fatigue or weakness.  EYES: No blurred or double vision.  EARS, NOSE, AND THROAT: No tinnitus or ear pain.  RESPIRATORY: No cough,positive for shortness of breath, wheezing or hemoptysis.  CARDIOVASCULAR: No chest pain, orthopnea, edema.  GASTROINTESTINAL: No nausea, vomiting, diarrhea or abdominal pain.  GENITOURINARY: No dysuria, hematuria.  ENDOCRINE: No polyuria, nocturia,  HEMATOLOGY: No anemia, easy bruising or bleeding SKIN: No rash or lesion. MUSCULOSKELETAL: No joint pain or arthritis.   NEUROLOGIC: No tingling, numbness, weakness.  PSYCHIATRY: No anxiety or depression.   ROS  DRUG ALLERGIES:  No Known Allergies  VITALS:  Blood pressure (!) 135/117, pulse 93, temperature 98.3 F (36.8 C), temperature source Oral, resp. rate 18, height 5\' 4"  (1.626 m), weight 54.8 kg (120 lb 12.8 oz), SpO2 92 %.  PHYSICAL EXAMINATION:  GENERAL:  83 y.o.-year-old patient lying in the bed with no acute distress.  EYES: Pupils equal, round, reactive to light and accommodation. No scleral icterus. Extraocular muscles intact.  HEENT: Head atraumatic, normocephalic. Oropharynx and nasopharynx clear.  NECK:  Supple, no jugular venous distention. No thyroid enlargement, no tenderness.  LUNGS: Normal breath sounds bilaterally, no wheezing, some crepitation. No use of accessory muscles of respiration.  CARDIOVASCULAR: S1, S2  normal. No murmurs, rubs, or gallops.  ABDOMEN: Soft, nontender, nondistended. Bowel sounds present. No organomegaly or mass.  EXTREMITIES: No pedal edema, cyanosis, or clubbing.  NEUROLOGIC: Cranial nerves II through XII are intact. Muscle strength 4/5 in all extremities. Sensation intact. Gait not checked.  PSYCHIATRIC: The patient is alert and oriented x 3.  SKIN: No obvious rash, lesion, or ulcer.   Physical Exam LABORATORY PANEL:   CBC  Recent Labs Lab 06/18/17 1140  WBC 6.6  HGB 9.8*  HCT 30.0*  PLT 392   ------------------------------------------------------------------------------------------------------------------  Chemistries   Recent Labs Lab 06/18/17 1140  NA 141  K 4.8  CL 108  CO2 27  GLUCOSE 113*  BUN 10  CREATININE 1.06*  CALCIUM 9.1   ------------------------------------------------------------------------------------------------------------------  Cardiac Enzymes  Recent Labs Lab 06/19/17 0223 06/19/17 0833  TROPONINI 0.21* 0.23*   ------------------------------------------------------------------------------------------------------------------  RADIOLOGY:  Dg Chest 2 View  Result Date: 06/18/2017 CLINICAL DATA:  Shortness of Breath EXAM: CHEST  2 VIEW COMPARISON:  06/05/2017 FINDINGS: There is hyperinflation of the lungs compatible with COPD. Mild cardiomegaly and vascular congestion. No overt edema. Small bilateral effusions. No acute bony abnormality. Severe compression fracture in the midthoracic spine, stable. IMPRESSION: COPD. Cardiomegaly, vascular congestion. Small effusions. Electronically Signed   By: Rolm Baptise M.D.   On: 06/18/2017 14:35   Nm Pulmonary Vent And Perf (v/q Scan)  Result Date: 06/18/2017 CLINICAL DATA:  Shortness of breath. EXAM: NUCLEAR MEDICINE VENTILATION - PERFUSION LUNG SCAN TECHNIQUE: Ventilation images were obtained in multiple projections using inhaled aerosol Tc-47m DTPA. Perfusion images  were obtained in  multiple projections after intravenous injection of Tc-85m MAA. RADIOPHARMACEUTICALS:  32.2 mCi Technetium-56m DTPA aerosol inhalation and 4.4 mCi Technetium-90m MAA IV COMPARISON:  Chest x-ray earlier today. FINDINGS: Ventilation: Numerous ventilation defects, most pronounced at the lung bases, left greater than right. Peripheral defects also noted throughout both lungs. Perfusion: Matched perfusion defects noted in the lung bases and peripherally within both lungs. IMPRESSION: Extensive matched ventilation and perfusion defects. Study is low probability for pulmonary embolus. Electronically Signed   By: Rolm Baptise M.D.   On: 06/18/2017 19:44   US Venous Img Lower Bilateral  Result Date: 06/18/2017 CLINICAL DATA:  Bilateral leg swelling and shortness of breath. EXAM: BILATERAL LOWER EXTREMITY VENOUS DOPPLER ULTRASOUND TECHNIQUE: Gray-scale sonography with graded compression, as well as color Doppler and duplex ultrasound were performed to evaluate the lower extremity deep venous systems from the level of the common femoral vein and including the common femoral, femoral, profunda femoral, popliteal and calf veins including the posterior tibial, peroneal and gastrocnemius veins when visible. The superficial great saphenous vein was also interrogated. Spectral Doppler was utilized to evaluate flow at rest and with distal augmentation maneuvers in the common femoral, femoral and popliteal veins. COMPARISON:  None. FINDINGS: RIGHT LOWER EXTREMITY Common Femoral Vein: No evidence of thrombus. Normal compressibility, respiratory phasicity and response to augmentation. Saphenofemoral Junction: No evidence of thrombus. Normal compressibility and flow on color Doppler imaging. Profunda Femoral Vein: No evidence of thrombus. Normal compressibility and flow on color Doppler imaging. Femoral Vein: No evidence of thrombus. Normal compressibility, respiratory phasicity and response to augmentation. Popliteal Vein: No  evidence of thrombus. Normal compressibility, respiratory phasicity and response to augmentation. Calf Veins: Duplicated right posterior tibial vein with occlusive thrombus in one of them. Normal compressibility and flow on color Doppler imaging in the remaining calf veins. Superficial Great Saphenous Vein: No evidence of thrombus. Normal compressibility and flow on color Doppler imaging. Venous Reflux:  None. Other Findings:  None. LEFT LOWER EXTREMITY Common Femoral Vein: No evidence of thrombus. Normal compressibility, respiratory phasicity and response to augmentation. Saphenofemoral Junction: No evidence of thrombus. Normal compressibility and flow on color Doppler imaging. Profunda Femoral Vein: No evidence of thrombus. Normal compressibility and flow on color Doppler imaging. Femoral Vein: No evidence of thrombus. Normal compressibility, respiratory phasicity and response to augmentation. Popliteal Vein: No evidence of thrombus. Normal compressibility, respiratory phasicity and response to augmentation. Calf Veins: No evidence of thrombus. Normal compressibility and flow on color Doppler imaging. Superficial Great Saphenous Vein: No evidence of thrombus. Normal compressibility and flow on color Doppler imaging. Venous Reflux:  None. Other Findings:  None. IMPRESSION: 1. 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 2. No additional deep venous thrombosis in either extremity. These results will be called to the ordering clinician or representative by the Radiologist Assistant, and communication documented in the PACS or zVision Dashboard. Electronically Signed   By: Titus Dubin M.D.   On: 06/18/2017 14:38    ASSESSMENT AND PLAN:   Active Problems:   Chronic deep vein thrombosis (DVT) of right lower extremity (HCC)   Dyspnea   Acute renal insufficiency   Demand ischemia (HCC)   Tobacco abuse counseling   Right leg DVT ([place])  #1. Acute right lower extremity DVT,   unable to use anticoagulants due to recent gastrointestinal bleed, getting vascular surgery involved for IVC filter placement, VQ scan is done, low probability for PE. #2. Dyspnea, likely acute diastolic CHF, patient  on Lasix, oxygen, get echocardiogram, continue budesonide, duo nebs, follow clinically.  No PE. Cardio consult requested. #3. Acute renal insufficiency, follow with diuretics, no UTI. #4 demand ischemia, followed cardiac enzymes 3 stable,   low-dose of metoprolol, nitroglycerin topically, unable to use aspirin due to recent GI bleed, got cardiologist involved #5. Tobacco abuse counseling, discussed this patient for 4 minutes, nicotine replacement therapy is initiated. The patient is agreeable #6 anemia due to blood loss    Recent GI bleed, stable now, monitor.   All the records are reviewed and case discussed with Care Management/Social Workerr. Management plans discussed with the patient, family and they are in agreement.  CODE STATUS: DNR  TOTAL TIME TAKING CARE OF THIS PATIENT: 35 minutes.     POSSIBLE D/C IN 1-2 DAYS, DEPENDING ON CLINICAL CONDITION.   Vaughan Basta M.D on 06/19/2017   Between 7am to 6pm - Pager - [phone]  After 6pm go to [email or web address] - password [email or web address] [facility]  Office  [phone]  CC: Primary care physician; Cletis Athens, MD  Note: This dictation was prepared with Dragon dictation along with smaller phrase technology. Any transcriptional errors that result from this process are unintentional.

## 2017-06-19 NOTE — Consult Note (Signed)
Cardiology Consultation Note  Patient ID: Lindsey Hobbs, MRN: [id number], DOB/AGE: [birth year] 83 y.o. Admit date: 06/18/2017   Date of Consult: 06/19/2017 Primary Physician: Cletis Athens, MD Primary Cardiologist: New to [facility] - consult by Rockey Situ Requesting Physician: Dr. Ether Griffins, MD  Chief Complaint: SOB/weakness Reason for Consult: Acute CHF, type unknown   HPI: Lindsey Hobbs is a 83 y.o. female who is being seen today for the evaluation of acute CHF at the request of Dr. Ether Griffins, MD. Patient has a h/o Crohn's disease with recent GI bleed in 05/2017, posthemorrhagic anemia requiring blood transfusion, COPD, ongoing tobacco abuse, HTN, and HLD who presented to [facility] with generalized weakness, SOB, PND, orthopnea, and LE swelling.   Patient does not have any previously known cardiac history. She was recently admitted in late July 2018 for GI bleed with colonoscopy showing mucosal ulceration, sigmoid stricture, blood in colon, but not actively bleeding, which was felt to be related to Crohn's disease. She underwent pRBC transfusion x 1 with a discharge HGB of 8.8. At home for felt close to her baseline for ~ 2 days then began to noticed increased SOB, LE swelling, PND, and generalized weakness. No chest pain. Upon her arrival to [facility] have stable vital signs, with a drop in BP to the 00F systolic overnight. CXR showed cardiomegaly with vascular congestion and small effusions. Labs showed BNP 2670, troponin 0.24-->0.23-->0.20-->0.23, SCr 1.06, K+ 4.8 (hemolysis noted), WBC 5.7, HGB 8.3-->9.8, PLT 260, TSH normal. She was given IV Lasix 60 mg with a documented UOP of 200 mL. She was also given nebs and steroids. LE ultrasound was positive for RLE DVT. VQ scan was low risk for PE. She has not been started on anticoagulation given recent GI bleed. IM has consulted vascular surgery for possible IVC filter given her recent GI bleed. Echo is pending. Currently notes mild SOB, PND, orthopnea, and LE swelling.    Past Medical History:  Diagnosis Date  . Anemia   . Avascular necrosis of bone of hip ([place])   . CHF (congestive heart failure) ([place])   . COPD (chronic obstructive pulmonary disease) ([place])   . Crohn's disease ([place])   . DVT (deep venous thrombosis) ([place]) 06/2017  . Guillain Barr syndrome ([place])   . Hyperlipidemia   . Hypertension       Most Recent Cardiac Studies: TTE pending   Surgical History:  Past Surgical History:  Procedure Laterality Date  . ABDOMINAL HYSTERECTOMY    . COLONOSCOPY WITH PROPOFOL Left 07/07/2015   Procedure: COLONOSCOPY WITH PROPOFOL;  Surgeon: Hulen Luster, MD;  Location: [facility] ENDOSCOPY;  Service: Endoscopy;  Laterality: Left;  . FLEXIBLE SIGMOIDOSCOPY N/A 06/05/2017   Procedure: FLEXIBLE SIGMOIDOSCOPY;  Surgeon: Jonathon Bellows, MD;  Location[facility] ENDOSCOPY;  Service: Gastroenterology;  Laterality: N/A;     Home Meds: Prior to Admission medications   Medication Sig Start Date End Date Taking? Authorizing Provider  Calcium Carbonate-Vitamin D (CALCIUM 600+D) 600-400 MG-UNIT per tablet Take 1 tablet by mouth 2 (two) times daily.   Yes [provider]  ferrous sulfate 325 (65 FE) MG tablet Take 1 tablet (325 mg total) by mouth 2 (two) times daily with a meal. Patient taking differently: Take 325 mg by mouth daily with breakfast.  07/08/15  Yes Sudini, Alveta Heimlich, MD  fexofenadine (ALLEGRA) 180 MG tablet Take 180 mg by mouth at bedtime.   Yes [provider]  folic acid (FOLVITE) 1 MG tablet Take 1 mg by mouth daily.   Yes  [provider]  gabapentin (NEURONTIN) 100 MG capsule Take 100-300 mg by mouth See admin instructions. One in the morning and 2 at night   Yes [provider]  ipratropium-albuterol (DUONEB) 0.5-2.5 (3) MG/3ML SOLN Take 3 mLs by nebulization every 6 (six) hours as needed. 06/06/17  Yes Gladstone Lighter, MD  loratadine (CLARITIN) 10 MG tablet Take 10 mg by mouth daily as needed for allergies.   Yes [provider]  mesalamine (LIALDA) 1.2 g EC tablet Take 2.4 g by mouth daily with breakfast.   Yes [provider]  Multiple Vitamin (MULTIVITAMIN WITH MINERALS) TABS tablet Take 1 tablet by mouth daily.   Yes [provider]  Omega-3 Fatty Acids (FISH OIL PO) Take 1 capsule by mouth 3 (three) times daily.   Yes [provider]  pantoprazole (PROTONIX) 40 MG tablet Take 40 mg by mouth daily. 05/13/17  Yes [provider]  zolpidem (AMBIEN) 10 MG tablet Take 10 mg by mouth at bedtime as needed for sleep.   Yes [provider]  guaiFENesin (MUCINEX) 600 MG 12 hr tablet Take 1 tablet (600 mg total) by mouth 2 (two) times daily. Patient not taking: Reported on 06/18/2017 06/06/17   Gladstone Lighter, MD  predniSONE (DELTASONE) 10 MG tablet Take 5 tablets q daily for 5 days and then 4 tablets q daily for 5 days and 3 tablets q daily for 5 days and 2 tablets q daily for 5 days and then 1 tablet q daily for 5 days and stop Patient not taking: Reported on 06/18/2017 06/06/17   Gladstone Lighter, MD    Inpatient Medications:  . budesonide (PULMICORT) nebulizer solution  0.25 mg Nebulization BID  . docusate sodium  100 mg Oral BID  . ferrous sulfate  325 mg Oral Q breakfast  . folic acid  1 mg Oral Daily  . furosemide  20 mg Intravenous BID  . gabapentin  100 mg Oral Q breakfast  . gabapentin  200 mg Oral QHS  . guaiFENesin  600 mg Oral BID  . ipratropium-albuterol  3 mL Nebulization Q6H  . mouth rinse  15 mL Mouth Rinse BID  . mesalamine  2.4 g Oral Q breakfast  . metoprolol tartrate  12.5 mg Oral BID  . multivitamin with minerals  1 tablet Oral Daily  . nicotine  21 mg Transdermal Daily  . nitroGLYCERIN  0.5 inch Topical Q8H  . omega-3 acid ethyl esters  1 g Oral Daily  . senna  1 tablet Oral BID  . sodium chloride flush  3 mL Intravenous Q12H   . sodium chloride      Allergies: No Known Allergies  Social History   Social History  . Marital status: Widowed     Spouse name: N/A  . Number of children: N/A  . Years of education: N/A   Occupational History  . Not on file.   Social History Main Topics  . Smoking status: Current Every Day Smoker    Packs/day: 0.50  . Smokeless tobacco: Never Used  . Alcohol use No  . Drug use: Unknown  . Sexual activity: Not on file   Other Topics Concern  . Not on file   Social History Narrative  . No narrative on file     Family History  Problem Relation Age of Onset  . Breast cancer Mother      Review of Systems: Review of Systems  Constitutional: Positive for malaise/fatigue. Negative for chills, diaphoresis, fever and  weight loss.  HENT: Negative for congestion.   Eyes: Negative for discharge and redness.  Respiratory: Positive for shortness of breath. Negative for cough, hemoptysis, sputum production and wheezing.   Cardiovascular: Positive for orthopnea, leg swelling and PND. Negative for chest pain, palpitations and claudication.  Gastrointestinal: Negative for abdominal pain, blood in stool, heartburn, melena, nausea and vomiting.  Genitourinary: Negative for hematuria.  Musculoskeletal: Negative for falls and myalgias.  Skin: Negative for rash.  Neurological: Positive for weakness. Negative for dizziness, tingling, tremors, sensory change, speech change, focal weakness and loss of consciousness.  Endo/Heme/Allergies: Does not bruise/bleed easily.  Psychiatric/Behavioral: Negative for substance abuse. The patient is not nervous/anxious.   All other systems reviewed and are negative.   Labs:  Recent Labs  06/18/17 1702 06/18/17 2024 06/19/17 0223 06/19/17 0833  TROPONINI 0.23* 0.20* 0.21* 0.23*   Lab Results  Component Value Date   WBC 6.6 06/18/2017   HGB 9.8 (L) 06/18/2017   HCT 30.0 (L) 06/18/2017   MCV 97.7 06/18/2017   PLT 392 06/18/2017    Recent Labs Lab 06/18/17 1140  NA 141  K 4.8  CL 108  CO2 27  BUN 10  CREATININE 1.06*  CALCIUM 9.1  GLUCOSE 113*    No results found for: CHOL, HDL, LDLCALC, TRIG No results found for: DDIMER  Radiology/Studies:  Dg Chest 2 View  Result Date: 06/18/2017 IMPRESSION: COPD. Cardiomegaly, vascular congestion. Small effusions. Electronically Signed   By: Rolm Baptise M.D.   On: 06/18/2017 14:35    Nm Pulmonary Vent And Perf (v/q Scan)  Result Date: 06/18/2017 IMPRESSION: Extensive matched ventilation and perfusion defects. Study is low probability for pulmonary embolus. Electronically Signed   By: Rolm Baptise M.D.   On: 06/18/2017 19:44   US Venous Img Lower Bilateral  Result Date: 06/18/2017 IMPRESSION: 1. 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 2. No additional deep venous thrombosis in either extremity. These results will be called to the ordering clinician or representative by the Radiologist Assistant, and communication documented in the PACS or zVision Dashboard. Electronically Signed   By: Titus Dubin M.D.   On: 06/18/2017 14:38    EKG: Interpreted by me showed: NSR, 92 bpm, anteroseptal infarct, no acute st/t changes Telemetry: Interpreted by me showed: NSR  Weights: Filed Weights   06/18/17 1134 06/19/17 0353  Weight: 127 lb (57.6 kg) 120 lb 12.8 oz (54.8 kg)     Physical Exam: Blood pressure (!) 135/117, pulse 93, temperature 98.3 F (36.8 C), temperature source Oral, resp. rate 18, height 5\' 4"  (1.626 m), weight 120 lb 12.8 oz (54.8 kg), SpO2 92 %. Body mass index is 20.74 kg/m. General: Elderly and frail appearing, in no acute distress. Head: Normocephalic, atraumatic, sclera non-icteric, no xanthomas, nares are without discharge.  Neck: Negative for carotid bruits. JVD not elevated. Lungs: Diminished breath sounds bilaterally with decreased breath sounds along the bilateral bases and crackles. Breathing is unlabored. On supplemental oxygen via nasal cannula.  Heart: RRR with S1 S2. No murmurs, rubs, or gallops appreciated. Abdomen: Soft,  non-tender, non-distended with normoactive bowel sounds. No hepatomegaly. No rebound/guarding. No obvious abdominal masses. Msk:  Strength and tone appear normal for age. Extremities: No clubbing or cyanosis. Trace bilateral pre-tibial edema. Distal pedal pulses are 2+ and equal bilaterally. Neuro: Alert and oriented X 3. No facial asymmetry. No focal deficit. Moves all extremities spontaneously. Psych:  Responds to questions appropriately with a normal affect.  Assessment and Plan:  Active Problems:   Chronic deep vein thrombosis (DVT) of right lower extremity (HCC)   Dyspnea   Acute renal insufficiency   Demand ischemia (HCC)   Tobacco abuse counseling   Right leg DVT ([place])    1. Acute respiratory distress with hypoxia: -Likely multifactorial including acute CHF, AECOPD, and anemia -Wean oxygen as able  2. Acute CHF, type unknown: -She continues to be volume overloaded -Continue IV Lasix 20 mg bid with KCl repletion -TTE pending -Given her troponin elevation, should she be found to have a reduced EF, she would require ischemic evaluation prior to discharge, will need to diurese first -Metoprolol   3. Elevated troponin: -No chest pain -Mildly elevated and flat trending likely in the setting of supply demand ischemia 2/2 volume overload and anemia -Echo pending -Not on ASA given recent GI bleed  4. GI bleed: -Monitor HGB  5. RLE DVT: -Not on anticoagulation 2/2 recent GI bleed -IM has asked vascular surgery to see the patient for possible IVC filter  6. AECOPD: -Per IM -Tobacco abuse cessation advised    7. Anemia: -On iron -Monitor -Maintain HGB > 8.5   Signed, Marcille Blanco [facility] Pager: [phone] 06/19/2017, 11:28 AM

## 2017-06-19 NOTE — Care Management (Signed)
Recent discharge 8/2 after stay for gi bleed. A home health referral was made to [facility] for SN and Physical therapy.  Reaching out to agency to confirm whether patient open to services.  Patient says the only people coming to her house is home instead once a week. Patient has been diagnosed with DVT right lower extremity and VQ scan show low probability for pulmonary embolus.  Also found to have CHF which appears to be new.  Vascular consult for IVC filter.  Current oxygen requirement is acute.  Discussed the need to wean  and /or  assess for home oxygen.  Current with PCP- Has access to a walker and a cane but usually uses her cane.  Lives alone with a son who lives locally for support. PT consult pending

## 2017-06-19 NOTE — Clinical Social Work Note (Signed)
CSW was informed by nurse that patient's family would like SNF.  CSW awaiting for PT recommendations.  Continuing to follow patient's progress throughout discharge planning.  Jones Broom. [place], MSW, [place]  06/19/2017 5:02 PM

## 2017-06-19 NOTE — Care Management (Signed)
Found that patient is currently open to [place]

## 2017-06-19 NOTE — Progress Notes (Signed)
Patient's family requested patient be evaluated for rehab. Notified Dr. Rexene Edison of their request. PT will have to perform an evaluation, she is not receiving treatment for DVT.

## 2017-06-19 NOTE — Consult Note (Signed)
[facility] Vascular Consult Note  MRN : [id number]  Lindsey Hobbs is a 83 y.o. (Aug 16, 1927) female who presents with chief complaint of  Chief Complaint  Patient presents with  . Shortness of Breath  . Leg Swelling  .  History of Present Illness: I am asked to see the patient by Dr. Ether Griffins for DVT and leg swelling. The patient has been noticing increasing swelling over the past 1-2 weeks and this has been associated with shortness of breath and lethargy. This progressed to the point where she was hypoxic and was admitted to the hospital yesterday. Because of her worsening leg swelling a DVT study was performed which I have reviewed. This demonstrates occlusive thrombosis of a paired tibial vein in the right leg with no proximal DVT above the tibial vein seen on the right. No DVT was seen in the left leg. She had significant anemia and was not felt to be a candidate for anticoagulation, so were consulted for consideration for IVC filter. The patient has a previous history of DVT in 2008.  It was not clear if this new thrombus was new or residual chronic thrombus from her previous DVT. Her mobility is quite poor. She denies fever or chills. With her ongoing shortness of breath and hypoxia, a VQ scan was performed. This was low probability for pulmonary embolus.  Current Facility-Administered Medications  Medication Dose Route Frequency Provider Last Rate Last Dose  . 0.9 %  sodium chloride infusion  250 mL Intravenous PRN Theodoro Grist, MD      . acetaminophen (TYLENOL) tablet 650 mg  650 mg Oral Q6H PRN Theodoro Grist, MD       Or  . acetaminophen (TYLENOL) suppository 650 mg  650 mg Rectal Q6H PRN Theodoro Grist, MD      . budesonide (PULMICORT) nebulizer solution 0.25 mg  0.25 mg Nebulization BID Theodoro Grist, MD   0.25 mg at 06/19/17 0733  . docusate sodium (COLACE) capsule 100 mg  100 mg Oral BID Theodoro Grist, MD   100 mg at 06/19/17 0914  . ferrous sulfate  tablet 325 mg  325 mg Oral Q breakfast Theodoro Grist, MD   325 mg at 06/19/17 0915  . folic acid (FOLVITE) tablet 1 mg  1 mg Oral Daily Theodoro Grist, MD   1 mg at 06/19/17 0626  . furosemide (LASIX) injection 20 mg  20 mg Intravenous BID Minna Merritts, MD      . gabapentin (NEURONTIN) capsule 100 mg  100 mg Oral Q breakfast Theodoro Grist, MD   100 mg at 06/19/17 0914  . gabapentin (NEURONTIN) capsule 200 mg  200 mg Oral QHS Theodoro Grist, MD   200 mg at 06/18/17 2127  . guaiFENesin (MUCINEX) 12 hr tablet 600 mg  600 mg Oral BID Theodoro Grist, MD   600 mg at 06/19/17 0915  . ipratropium-albuterol (DUONEB) 0.5-2.5 (3) MG/3ML nebulizer solution 3 mL  3 mL Nebulization Q4H PRN [place], Rima, MD      . ipratropium-albuterol (DUONEB) 0.5-2.5 (3) MG/3ML nebulizer solution 3 mL  3 mL Nebulization Q6H [place], Rima, MD   3 mL at 06/19/17 1331  . loratadine (CLARITIN) tablet 10 mg  10 mg Oral Daily PRN Theodoro Grist, MD      . MEDLINE mouth rinse  15 mL Mouth Rinse BID Theodoro Grist, MD   15 mL at 06/19/17 0925  . mesalamine (LIALDA) EC tablet 2.4 g  2.4 g Oral Q breakfast [place], [place],  MD   2.4 g at 06/19/17 0916  . metoprolol tartrate (LOPRESSOR) tablet 12.5 mg  12.5 mg Oral BID Theodoro Grist, MD   12.5 mg at 06/19/17 0915  . multivitamin with minerals tablet 1 tablet  1 tablet Oral Daily Theodoro Grist, MD   1 tablet at 06/19/17 0914  . nicotine (NICODERM CQ - dosed in mg/24 hours) patch 21 mg  21 mg Transdermal Daily Theodoro Grist, MD   21 mg at 06/19/17 0916  . nitroGLYCERIN (NITROGLYN) 2 % ointment 0.5 inch  0.5 inch Topical Q8H Theodoro Grist, MD   0.5 inch at 06/19/17 0600  . omega-3 acid ethyl esters (LOVAZA) capsule 1 g  1 g Oral Daily Theodoro Grist, MD   1 g at 06/19/17 0914  . ondansetron (ZOFRAN) tablet 4 mg  4 mg Oral Q6H PRN Theodoro Grist, MD       Or  . ondansetron (ZOFRAN) injection 4 mg  4 mg Intravenous Q6H PRN Theodoro Grist, MD      . pantoprazole (PROTONIX) EC  tablet 40 mg  40 mg Oral Daily PRN Theodoro Grist, MD      . senna (SENOKOT) tablet 8.6 mg  1 tablet Oral BID Theodoro Grist, MD   8.6 mg at 06/19/17 0915  . sodium chloride flush (NS) 0.9 % injection 3 mL  3 mL Intravenous Q12H Theodoro Grist, MD   3 mL at 06/19/17 0922  . sodium chloride flush (NS) 0.9 % injection 3 mL  3 mL Intravenous PRN Theodoro Grist, MD      . technetium TC 32M diethylenetriame-pentaacetic acid (DTPA) injection 66.06 millicurie  30.16 millicurie Intravenous Once PRN [place], David A, MD      . zolpidem Lorrin Mais) tablet 5 mg  5 mg Oral QHS PRN Theodoro Grist, MD   5 mg at 06/18/17 2127    Past Medical History:  Diagnosis Date  . Anemia   . Avascular necrosis of bone of hip ([place])   . CHF (congestive heart failure) ([place])   . COPD (chronic obstructive pulmonary disease) ([place])   . Crohn's disease ([place])   . DVT (deep venous thrombosis) ([place]) 06/2017  . Guillain Barr syndrome ([place])   . Hyperlipidemia   . Hypertension     Past Surgical History:  Procedure Laterality Date  . ABDOMINAL HYSTERECTOMY    . COLONOSCOPY WITH PROPOFOL Left 07/07/2015   Procedure: COLONOSCOPY WITH PROPOFOL;  Surgeon: Hulen Luster, MD;  Location[facility] ENDOSCOPY;  Service: Endoscopy;  Laterality: Left;  . FLEXIBLE SIGMOIDOSCOPY N/A 06/05/2017   Procedure: FLEXIBLE SIGMOIDOSCOPY;  Surgeon: Jonathon Bellows, MD;  Location: [facility] ENDOSCOPY;  Service: Gastroenterology;  Laterality: N/A;    Social History Social History  Substance Use Topics  . Smoking status: Current Every Day Smoker    Packs/day: 0.50  . Smokeless tobacco: Never Used  . Alcohol use No  No IVDU  Family History Family History  Problem Relation Age of Onset  . Breast cancer Mother   No bleeding disorders, clotting disorders, or aneurysms  No Known Allergies   REVIEW OF SYSTEMS (Negative unless checked)  Constitutional: [] Weight loss  [] Fever  [] Chills Cardiac: [] Chest pain   [] Chest pressure   [x] Palpitations   [] Shortness of  breath when laying flat   [x] Shortness of breath at rest   [x] Shortness of breath with exertion. Vascular:  [] Pain in legs with walking   [] Pain in legs at rest   [] Pain in legs when laying flat   [] Claudication   [] Pain in  feet when walking  [] Pain in feet at rest  [] Pain in feet when laying flat   [] History of DVT   [] Phlebitis   [x] Swelling in legs   [] Varicose veins   [] Non-healing ulcers Pulmonary:   [] Uses home oxygen   [] Productive cough   [] Hemoptysis   [] Wheeze  [x] COPD   [] Asthma Neurologic:  [] Dizziness  [] Blackouts   [] Seizures   [] History of stroke   [] History of TIA  [] Aphasia   [] Temporary blindness   [] Dysphagia   [] Weakness or numbness in arms   [] Weakness or numbness in legs Musculoskeletal:  [x] Arthritis   [] Joint swelling   [x] Joint pain   [] Low back pain Hematologic:  [x] Easy bruising  [] Easy bleeding   [] Hypercoagulable state   [x] Anemic  [] Hepatitis Gastrointestinal:  [] Blood in stool   [] Vomiting blood  [] Gastroesophageal reflux/heartburn   [] Difficulty swallowing. Genitourinary:  [] Chronic kidney disease   [] Difficult urination  [] Frequent urination  [] Burning with urination   [] Blood in urine Skin:  [] Rashes   [] Ulcers   [] Wounds Psychological:  [] History of anxiety   []  History of major depression.  Physical Examination  Vitals:   06/19/17 0353 06/19/17 0733 06/19/17 0810 06/19/17 1203  BP: (!) 94/31  (!) 135/117 (!) 91/41  Pulse: 85  93 75  Resp: 18   18  Temp: 98.4 F (36.9 C)  98.3 F (36.8 C) 97.6 F (36.4 C)  TempSrc: Oral  Oral Oral  SpO2: 92% 92% 92% 96%  Weight: 54.8 kg (120 lb 12.8 oz)     Height:       Body mass index is 20.74 kg/m. Gen:  Frail and debilitated appearing, NAD Head: [place]/AT, + temporalis wasting.  Ear/Nose/Throat: Hearing grossly intact, nares w/o erythema or drainage, oropharynx w/o Erythema/Exudate Eyes: Sclera non-icteric, conjunctiva clear Neck: Trachea midline.  No JVD.  Pulmonary:  Good air movement, respirations not labored on  supplemental oxygen.  Cardiac: irregular Vascular:  Vessel Right Left  Radial Palpable Palpable                          PT Not Palpable Not Palpable  DP 1+ Palpable 1+ Palpable   Gastrointestinal: soft, non-tender/non-distended.  Musculoskeletal: M/S 5/5 throughout but diffusely weak.  No deformity or atrophy. 1-2+ BLE edema. Neurologic: Sensation grossly intact in extremities.  Symmetrical.  Speech is fluent. Motor exam as listed above. Psychiatric: Judgment intact, Mood & affect appropriate for pt's clinical situation. Dermatologic: No rashes or ulcers noted.  No cellulitis or open wounds.       CBC Lab Results  Component Value Date   WBC 6.6 06/18/2017   HGB 9.8 (L) 06/18/2017   HCT 30.0 (L) 06/18/2017   MCV 97.7 06/18/2017   PLT 392 06/18/2017    BMET    Component Value Date/Time   NA 141 06/18/2017 1140   NA 143 03/02/2013 0455   K 4.8 06/18/2017 1140   K 3.7 03/02/2013 0455   CL 108 06/18/2017 1140   CL 109 (H) 03/02/2013 0455   CO2 27 06/18/2017 1140   CO2 25 03/02/2013 0455   GLUCOSE 113 (H) 06/18/2017 1140   GLUCOSE 124 (H) 03/02/2013 0455   BUN 10 06/18/2017 1140   BUN 22 (H) 03/02/2013 0455   CREATININE 1.06 (H) 06/18/2017 1140   CREATININE 1.05 03/02/2013 0455   CALCIUM 9.1 06/18/2017 1140   CALCIUM 8.6 03/02/2013 0455   GFRNONAA 46 (L) 06/18/2017 1140   GFRNONAA 49 (L) 03/02/2013 0455  GFRAA 53 (L) 06/18/2017 1140   GFRAA 57 (L) 03/02/2013 0455   Estimated Creatinine Clearance: 32.3 mL/min (A) (by C-G formula based on SCr of 1.06 mg/dL (H)).  COAG Lab Results  Component Value Date   INR 1.03 06/18/2017   INR 0.99 06/01/2017   INR 1.09 07/06/2015    Radiology Dg Chest 2 View  Result Date: 06/18/2017 CLINICAL DATA:  Shortness of Breath EXAM: CHEST  2 VIEW COMPARISON:  06/05/2017 FINDINGS: There is hyperinflation of the lungs compatible with COPD. Mild cardiomegaly and vascular congestion. No overt edema. Small bilateral effusions.  No acute bony abnormality. Severe compression fracture in the midthoracic spine, stable. IMPRESSION: COPD. Cardiomegaly, vascular congestion. Small effusions. Electronically Signed   By: Rolm Baptise M.D.   On: 06/18/2017 14:35   Dg Chest 2 View  Result Date: 06/05/2017 CLINICAL DATA:  Cough. History of COPD and hypertension. Current smoker. EXAM: CHEST  2 VIEW COMPARISON:  PA and lateral chest x-ray of June 01, 2017 FINDINGS: The lungs are mildly hyperinflated. There is patchy increased density in the lingula. There are new small bilateral pleural effusions layering posteriorly. The cardiac silhouette is mildly enlarged. The pulmonary vascularity is not clearly engorged. There is calcification in the wall of the mitral valvular annulus and in the aortic arch. There is chronic wedge compression of T9. IMPRESSION: COPD. New small bilateral pleural effusions layering posteriorly. No discrete pneumonia is demonstrated but patchy lingular atelectasis or early infiltrate is suspected. Followup PA and lateral chest X-ray is recommended in 3-4 weeks following trial of antibiotic therapy to ensure resolution and exclude underlying malignancy. Cardiomegaly without pulmonary vascular congestion. Thoracic aortic atherosclerosis. Electronically Signed   By: David  Martinique M.D.   On: 06/05/2017 14:41   Dg Chest 2 View  Result Date: 06/01/2017 CLINICAL DATA:  Low O2 sats EXAM: CHEST  2 VIEW COMPARISON:  02/28/2011 FINDINGS: There is hyperinflation of the lungs compatible with COPD. Atelectasis in the left mid lung. No confluent opacity on the right. No effusions. Heart is borderline in size. IMPRESSION: COPD.  Left perihilar atelectasis.  Borderline heart size. Electronically Signed   By: Rolm Baptise M.D.   On: 06/01/2017 12:36   Nm Pulmonary Vent And Perf (v/q Scan)  Result Date: 06/18/2017 CLINICAL DATA:  Shortness of breath. EXAM: NUCLEAR MEDICINE VENTILATION - PERFUSION LUNG SCAN TECHNIQUE: Ventilation images were  obtained in multiple projections using inhaled aerosol Tc-66m DTPA. Perfusion images were obtained in multiple projections after intravenous injection of Tc-86m MAA. RADIOPHARMACEUTICALS:  32.2 mCi Technetium-42m DTPA aerosol inhalation and 4.4 mCi Technetium-63m MAA IV COMPARISON:  Chest x-ray earlier today. FINDINGS: Ventilation: Numerous ventilation defects, most pronounced at the lung bases, left greater than right. Peripheral defects also noted throughout both lungs. Perfusion: Matched perfusion defects noted in the lung bases and peripherally within both lungs. IMPRESSION: Extensive matched ventilation and perfusion defects. Study is low probability for pulmonary embolus. Electronically Signed   By: Rolm Baptise M.D.   On: 06/18/2017 19:44   US Venous Img Lower Bilateral  Result Date: 06/18/2017 CLINICAL DATA:  Bilateral leg swelling and shortness of breath. EXAM: BILATERAL LOWER EXTREMITY VENOUS DOPPLER ULTRASOUND TECHNIQUE: Gray-scale sonography with graded compression, as well as color Doppler and duplex ultrasound were performed to evaluate the lower extremity deep venous systems from the level of the common femoral vein and including the common femoral, femoral, profunda femoral, popliteal and calf veins including the posterior tibial, peroneal and gastrocnemius veins when visible. The superficial great saphenous vein  was also interrogated. Spectral Doppler was utilized to evaluate flow at rest and with distal augmentation maneuvers in the common femoral, femoral and popliteal veins. COMPARISON:  None. FINDINGS: RIGHT LOWER EXTREMITY Common Femoral Vein: No evidence of thrombus. Normal compressibility, respiratory phasicity and response to augmentation. Saphenofemoral Junction: No evidence of thrombus. Normal compressibility and flow on color Doppler imaging. Profunda Femoral Vein: No evidence of thrombus. Normal compressibility and flow on color Doppler imaging. Femoral Vein: No evidence of thrombus.  Normal compressibility, respiratory phasicity and response to augmentation. Popliteal Vein: No evidence of thrombus. Normal compressibility, respiratory phasicity and response to augmentation. Calf Veins: Duplicated right posterior tibial vein with occlusive thrombus in one of them. Normal compressibility and flow on color Doppler imaging in the remaining calf veins. Superficial Great Saphenous Vein: No evidence of thrombus. Normal compressibility and flow on color Doppler imaging. Venous Reflux:  None. Other Findings:  None. LEFT LOWER EXTREMITY Common Femoral Vein: No evidence of thrombus. Normal compressibility, respiratory phasicity and response to augmentation. Saphenofemoral Junction: No evidence of thrombus. Normal compressibility and flow on color Doppler imaging. Profunda Femoral Vein: No evidence of thrombus. Normal compressibility and flow on color Doppler imaging. Femoral Vein: No evidence of thrombus. Normal compressibility, respiratory phasicity and response to augmentation. Popliteal Vein: No evidence of thrombus. Normal compressibility, respiratory phasicity and response to augmentation. Calf Veins: No evidence of thrombus. Normal compressibility and flow on color Doppler imaging. Superficial Great Saphenous Vein: No evidence of thrombus. Normal compressibility and flow on color Doppler imaging. Venous Reflux:  None. Other Findings:  None. IMPRESSION: 1. 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 2. No additional deep venous thrombosis in either extremity. These results will be called to the ordering clinician or representative by the Radiologist Assistant, and communication documented in the PACS or zVision Dashboard. Electronically Signed   By: Titus Dubin M.D.   On: 06/18/2017 14:38      Assessment/Plan 1. RLE tibial vein DVT with contraindication to anticoagulation. I have had a discussion with the patient regarding options for treatment. Tibial vein  DVTs represented an extremely low risk DVT for embolization and do not actually require full strength anticoagulation. Given that, I do not think she necessarily needs an IVC filter at this time. It is necessary to follow these and an ultrasound in 1-2 weeks would be recommended to ensure that propagation has not occurred. Should she have propagation into the popliteal vein or more proximal, and continue to have contraindication to anticoagulation, IVC filter would then be necessary. If she can tolerate an aspirin daily I would recommend that. I will be happy to see her in the office in 1-2 weeks with a DVT study for follow-up. 2.  Respiratory insufficiency.not likely due to pulmonary embolus based on a negative VQ scan and a low risk DVT. Weaning oxygen as tolerated and medical management as recommended by the primary service and cardiology. 3. Anemia. Poor candidate for anticoagulation for this as well as her frailty and high risk of falls. 4. Hypertension. Stable on outpatient medications and blood pressure control important in reducing the progression of atherosclerotic disease. On appropriate oral medications.    Leotis Pain, MD  06/19/2017 1:33 PM    This note was created with Dragon medical transcription system.  Any error is purely unintentional

## 2017-06-19 NOTE — Progress Notes (Signed)
PT Cancellation Note  Patient Details Name: Lindsey Hobbs MRN: [id number] DOB: 07-25-1927   Cancelled Treatment:    Reason Eval/Treat Not Completed: Medical issues which prohibited therapy. Patient found to have DVT in RLE and is not being anti-coagulated currently due to recent GI bleed. She is due for a consult by vascular surgery for possible IVC filter placement. PT will hold until after vascular consult.    Royce Macadamia PT, DPT, CSCS    06/19/2017, 1:37 PM

## 2017-06-20 DIAGNOSIS — J9601 Acute respiratory failure with hypoxia: Secondary | ICD-10-CM

## 2017-06-20 DIAGNOSIS — Z7189 Other specified counseling: Secondary | ICD-10-CM

## 2017-06-20 DIAGNOSIS — N289 Disorder of kidney and ureter, unspecified: Secondary | ICD-10-CM

## 2017-06-20 DIAGNOSIS — Z515 Encounter for palliative care: Secondary | ICD-10-CM

## 2017-06-20 LAB — CBC
HCT: 26.1 % — ABNORMAL LOW (ref 35.0–47.0)
HCT: 33.2 % — ABNORMAL LOW (ref 35.0–47.0)
HEMOGLOBIN: 8.3 g/dL — AB (ref 12.0–16.0)
Hemoglobin: 10.8 g/dL — ABNORMAL LOW (ref 12.0–16.0)
MCH: 31.5 pg (ref 26.0–34.0)
MCH: 31.5 pg (ref 26.0–34.0)
MCHC: 31.9 g/dL — AB (ref 32.0–36.0)
MCHC: 32.5 g/dL (ref 32.0–36.0)
MCV: 98.5 fL (ref 80.0–100.0)
MCV: 97 fL (ref 80.0–100.0)
PLATELETS: 320 10*3/uL (ref 150–440)
PLATELETS: 328 10*3/uL (ref 150–440)
RBC: 2.65 MIL/uL — AB (ref 3.80–5.20)
RBC: 3.42 MIL/uL — AB (ref 3.80–5.20)
RDW: 22.8 % — ABNORMAL HIGH (ref 11.5–14.5)
RDW: 23.7 % — AB (ref 11.5–14.5)
WBC: 6.2 10*3/uL (ref 3.6–11.0)
WBC: 7.3 10*3/uL (ref 3.6–11.0)

## 2017-06-20 LAB — GLUCOSE, CAPILLARY: GLUCOSE-CAPILLARY: 87 mg/dL (ref 65–99)

## 2017-06-20 LAB — BASIC METABOLIC PANEL
ANION GAP: 6 (ref 5–15)
BUN: 16 mg/dL (ref 6–20)
CALCIUM: 8.5 mg/dL — AB (ref 8.9–10.3)
CO2: 34 mmol/L — ABNORMAL HIGH (ref 22–32)
Chloride: 110 mmol/L (ref 101–111)
Creatinine, Ser: 1.09 mg/dL — ABNORMAL HIGH (ref 0.44–1.00)
GFR calc Af Amer: 51 mL/min — ABNORMAL LOW (ref >60)
GFR, EST NON AFRICAN AMERICAN: 44 mL/min — AB (ref >60)
GLUCOSE: 97 mg/dL (ref 65–99)
POTASSIUM: 3.5 mmol/L (ref 3.5–5.1)
SODIUM: 150 mmol/L — AB (ref 135–145)

## 2017-06-20 LAB — PREPARE RBC (CROSSMATCH)

## 2017-06-20 MED ORDER — POTASSIUM CHLORIDE CRYS ER 20 MEQ PO TBCR
40.0000 meq | EXTENDED_RELEASE_TABLET | Freq: Once | ORAL | Status: AC
  Administered 2017-06-20: 40 meq via ORAL
  Filled 2017-06-20: qty 2

## 2017-06-20 MED ORDER — IPRATROPIUM-ALBUTEROL 0.5-2.5 (3) MG/3ML IN SOLN
3.0000 mL | Freq: Two times a day (BID) | RESPIRATORY_TRACT | Status: DC
  Administered 2017-06-21 – 2017-06-24 (×7): 3 mL via RESPIRATORY_TRACT
  Filled 2017-06-20 (×7): qty 3

## 2017-06-20 MED ORDER — SODIUM CHLORIDE 0.9 % IV SOLN
Freq: Once | INTRAVENOUS | Status: AC
  Administered 2017-06-20: 14:00:00 via INTRAVENOUS

## 2017-06-20 MED ORDER — DEXTROSE 5 % IV SOLN
INTRAVENOUS | Status: AC
  Administered 2017-06-20 – 2017-06-21 (×2): via INTRAVENOUS

## 2017-06-20 NOTE — Evaluation (Signed)
Physical Therapy Evaluation Patient Details Name: Lindsey Hobbs MRN: [id number] DOB: 1927-08-10 Today's Date: 06/20/2017   History of Present Illness  83 y.o. female with a known history of Recent admission for acute posthemorrhagic anemia due to gastrointestinal bleed, status post colonoscopy 06/05/2017 by Dr. Vicente Males, revealing mucosal ulceration, sigmoid stricture, blood in colon, but not actively bleeding, which was felt to be related to Crohn's disease, COPD, ongoing tobacco abuse, hypertension, hyperlipidemia, who presents to the hospital with complaints of shortness of breath for the past 1-2 weeks. Her oxygen saturation and emergency room was 86% on room air.  She was found to have L DVT, negative for PE, not a candidate for blood thinners or IVC.  Toponins are elevated but stable.  Clinical Impression  Discussed with nursing before assessment regarding cardio issues, agreed that pt is stable at this time to do some light activity with PT.  Pt is not at her baseline, generally she is able to walk around grocery story and be somewhat independent in the community with [facility], this is not that case per today's performance.  She generally showed good effort and was able to walk ~85 ft, but is not at all at her baseline, had some issues with both O2 and HR, was fatigued, very reliant on the walker and generally lacked confidence.  Pt will need STR to get back to a safe level to return home.     Follow Up Recommendations SNF    Equipment Recommendations       Recommendations for Other Services       Precautions / Restrictions Precautions Precautions: Fall Restrictions Weight Bearing Restrictions: No      Mobility  Bed Mobility Overal bed mobility: Modified Independent             General bed mobility comments: PT able to get to sitting, needed light assist to scoot to square up to EOB  Transfers Overall transfer level: Modified independent Equipment used: Rolling walker (2  wheeled)             General transfer comment: Cuing and close guarding for positioning, safety, able to rise w/o direct phyiscal assist  Ambulation/Gait Ambulation/Gait assistance: Min guard Ambulation Distance (Feet): 85 Feet Assistive device: Rolling walker (2 wheeled)       General Gait Details: Pt very reliant on walker. O2 dropped from 92-87% on 2 liters and HR increased from 80s to 100s.  She reported considerable fatigue and is not at all near her baseline.   Stairs            Wheelchair Mobility    Modified Rankin (Stroke Patients Only)       Balance Overall balance assessment: Needs assistance   Sitting balance-Leahy Scale: Good       Standing balance-Leahy Scale: Fair Standing balance comment: typically only needs SPC, reliant on walker today                             Pertinent Vitals/Pain Pain Assessment: No/denies pain    Home Living Family/patient expects to be discharged to:: Private residence Living Arrangements: Alone Available Help at Discharge:  (has an aide help on Wednesdays, son can help PRN) Type of Home: House Home Access: Stairs to enter Entrance Stairs-Rails: Right Entrance Stairs-Number of Steps: Reports 6 steps into condo and >10 steps to upstairs    Home Equipment: Walker - 2 wheels;Cane - single point  Prior Function Level of Independence: Independent with assistive device(s)         Comments: no reported falls, out of the house at least 2X/week (groceries with aide Wed, drives herself to hair appointment Fridays)     Hand Dominance        Extremity/Trunk Assessment   Upper Extremity Assessment Upper Extremity Assessment: Generalized weakness (age appropriate limitations)    Lower Extremity Assessment Lower Extremity Assessment: Generalized weakness (grossly functional though weak t/o, negative Homan's signs )       Communication   Communication: No difficulties  Cognition  Arousal/Alertness: Awake/alert Behavior During Therapy: WFL for tasks assessed/performed Overall Cognitive Status: Within Functional Limits for tasks assessed                                        General Comments      Exercises     Assessment/Plan    PT Assessment Patient needs continued PT services  PT Problem List Decreased strength;Decreased mobility;Decreased activity tolerance;Cardiopulmonary status limiting activity;Decreased balance       PT Treatment Interventions DME instruction;Gait training;Stair training;Functional mobility training;Therapeutic activities;Therapeutic exercise;Balance training;Patient/family education    PT Goals (Current goals can be found in the Care Plan section)  Acute Rehab PT Goals Patient Stated Goal: To return home safely  PT Goal Formulation: With patient Time For Goal Achievement: 07/04/17 Potential to Achieve Goals: Fair    Frequency Min 2X/week   Barriers to discharge Decreased caregiver support      Co-evaluation               AM-PAC PT "6 Clicks" Daily Activity  Outcome Measure Difficulty turning over in bed (including adjusting bedclothes, sheets and blankets)?: None Difficulty moving from lying on back to sitting on the side of the bed? : A Little Difficulty sitting down on and standing up from a chair with arms (e.g., wheelchair, bedside commode, etc,.)?: A Little Help needed moving to and from a bed to chair (including a wheelchair)?: A Little Help needed walking in hospital room?: A Little Help needed climbing 3-5 steps with a railing? : A Lot 6 Click Score: 18    End of Session Equipment Utilized During Treatment: Gait belt;Oxygen (2 lit) Activity Tolerance: Patient limited by fatigue Patient left: in bed;with bed alarm set;with call bell/phone within reach Nurse Communication: Mobility status PT Visit Diagnosis: Muscle weakness (generalized) (M62.81);Difficulty in walking, not elsewhere  classified (R26.2)    Time: 4403-4742 PT Time Calculation (min) (ACUTE ONLY): 26 min   Charges:   PT Evaluation $PT Eval Low Complexity: 1 Low     PT G Codes:   PT G-Codes **NOT FOR INPATIENT CLASS** Functional Assessment Tool Used: AM-PAC 6 Clicks Basic Mobility Functional Limitation: Mobility: Walking and moving around Mobility: Walking and Moving Around Current Status (V9563): At least 40 percent but less than 60 percent impaired, limited or restricted Mobility: Walking and Moving Around Goal Status [phone]): At least 1 percent but less than 20 percent impaired, limited or restricted    Kreg Shropshire, DPT 06/20/2017, 10:54 AM

## 2017-06-20 NOTE — Progress Notes (Signed)
[facility] [place] NAME: Lindsey Hobbs    MR#:  [id number]  DATE OF BIRTH:  [birth year]  SUBJECTIVE:  CHIEF COMPLAINT:   Chief Complaint  Patient presents with  . Shortness of Breath  . Leg Swelling  Weak, sodium trending up now at 150, hemoglobin 8.3 REVIEW OF SYSTEMS:  CONSTITUTIONAL: No fever,positive for fatigue or weakness.  EYES: No blurred or double vision.  EARS, NOSE, AND THROAT: No tinnitus or ear pain.  RESPIRATORY: No cough,positive for shortness of breath, wheezing or hemoptysis.  CARDIOVASCULAR: No chest pain, orthopnea, edema.  GASTROINTESTINAL: No nausea, vomiting, diarrhea or abdominal pain.  GENITOURINARY: No dysuria, hematuria.  ENDOCRINE: No polyuria, nocturia,  HEMATOLOGY: No anemia, easy bruising or bleeding SKIN: No rash or lesion. MUSCULOSKELETAL: No joint pain or arthritis.   NEUROLOGIC: No tingling, numbness, weakness.  PSYCHIATRY: No anxiety or depression.   ROS  DRUG ALLERGIES:  No Known Allergies VITALS:  Blood pressure (!) 118/48, pulse 84, temperature 97.7 F (36.5 C), temperature source Oral, resp. rate 16, height 5\' 4"  (1.626 m), weight 54.7 kg (120 lb 9.6 oz), SpO2 94 %. PHYSICAL EXAMINATION:  GENERAL:  83 y.o.-year-old patient lying in the bed with no acute distress.  EYES: Pupils equal, round, reactive to light and accommodation. No scleral icterus. Extraocular muscles intact.  HEENT: Head atraumatic, normocephalic. Oropharynx and nasopharynx clear.  NECK:  Supple, no jugular venous distention. No thyroid enlargement, no tenderness.  LUNGS: Normal breath sounds bilaterally, no wheezing, some crepitation. No use of accessory muscles of respiration.  CARDIOVASCULAR: S1, S2 normal. No murmurs, rubs, or gallops.  ABDOMEN: Soft, nontender, nondistended. Bowel sounds present. No organomegaly or mass.  EXTREMITIES: No pedal edema, cyanosis, or clubbing.  NEUROLOGIC: Cranial nerves II through XII are  intact. Muscle strength 4/5 in all extremities. Sensation intact. Gait not checked.  PSYCHIATRIC: The patient is alert and oriented x 3.  SKIN: No obvious rash, lesion, or ulcer.   Physical Exam LABORATORY PANEL:   CBC  Recent Labs Lab 06/20/17 0430  WBC 6.2  HGB 8.3*  HCT 26.1*  PLT 320   ------------------------------------------------------------------------------------------------------------------  Chemistries   Recent Labs Lab 06/20/17 0430  NA 150*  K 3.5  CL 110  CO2 34*  GLUCOSE 97  BUN 16  CREATININE 1.09*  CALCIUM 8.5*   ------------------------------------------------------------------------------------------------------------------  Cardiac Enzymes  Recent Labs Lab 06/19/17 0833 06/19/17 1427  TROPONINI 0.23* 0.23*   ------------------------------------------------------------------------------------------------------------------  RADIOLOGY:  Nm Pulmonary Vent And Perf (v/q Scan)  Result Date: 06/18/2017 CLINICAL DATA:  Shortness of breath. EXAM: NUCLEAR MEDICINE VENTILATION - PERFUSION LUNG SCAN TECHNIQUE: Ventilation images were obtained in multiple projections using inhaled aerosol Tc-41m DTPA. Perfusion images were obtained in multiple projections after intravenous injection of Tc-77m MAA. RADIOPHARMACEUTICALS:  32.2 mCi Technetium-48m DTPA aerosol inhalation and 4.4 mCi Technetium-43m MAA IV COMPARISON:  Chest x-ray earlier today. FINDINGS: Ventilation: Numerous ventilation defects, most pronounced at the lung bases, left greater than right. Peripheral defects also noted throughout both lungs. Perfusion: Matched perfusion defects noted in the lung bases and peripherally within both lungs. IMPRESSION: Extensive matched ventilation and perfusion defects. Study is low probability for pulmonary embolus. Electronically Signed   By: Rolm Baptise M.D.   On: 06/18/2017 19:44    ASSESSMENT AND PLAN:   Active Problems:   Chronic deep vein thrombosis (DVT)  of right lower extremity (HCC)   Dyspnea   Acute renal insufficiency   Demand ischemia (HCC)   Tobacco  abuse counseling   Right leg DVT (HCC)   * Hypernatremia -Change her fluid to D5 water and monitor her sodium   * Acute right lower extremity DVT  - unable to use anticoagulants due to recent gastrointestinal bleed, vascular surgery does not recommend IVC filter placement - - VQ scan - low probability for PE. - Vascular surgery recommends treatment with aspirin if and when she is able to use it  * Acute hypoxic respiratory failure - likely due to COPD/CHF exacerbation  * Acute Systolic CHF - 2-D echo shows LVEF of 35-40% - Continue IV Lasix - Cardiology considering Myoview tomorrow, considering her history of GI bleed.  She is a not a good candidate for cardiac catheterization  * Elevated troponin - Likely supply demand ischemia - Getting Myoview tomorrow - Asymptomatic   * Acute renal insufficiency: Close to baseline, monitor   * GI bleed/acute blood loss anemia  - Hemoglobin 8.3 today - Not a good candidate for long-term anticoagulation  - Considering her cardiac history.  Cardiology recommends keeping hemoglobin above 8.5.   - We will go and transfuse 1 unit of PRBC  * COPD - Seems stable, no wheezing   * Tobacco abuse counseling, discussed this patient for 4 minutes, nicotine replacement therapy is initiated. The patient is agreeable    All the records are reviewed and case discussed with Care Management/Social Worker. Management plans discussed with the patient,.  Nursing  and they are in agreement.  CODE STATUS:DO NOT RESUSCITATE, palliative care consultation  TOTAL TIME TAKING CARE OF THIS PATIENT: 35 minutes.     POSSIBLE D/C IN 3-4 DAYS, DEPENDING ON CLINICAL CONDITION.   Max Sane M.D on 06/20/2017   Between 7am to 6pm - Pager - [phone]  After 6pm go to [email or web address] - password [email or web address] [facility]  Office   [phone]  CC: Primary care physician; Cletis Athens, MD  Note: This dictation was prepared with Dragon dictation along with smaller phrase technology. Any transcriptional errors that result from this process are unintentional.

## 2017-06-20 NOTE — Clinical Social Work Placement (Addendum)
   CLINICAL SOCIAL WORK PLACEMENT  NOTE  Date:  06/20/2017  Patient Details  Name: Lindsey Hobbs MRN: [id number] Date of Birth: Jan 23, 1927  Clinical Social Work is seeking post-discharge placement for this patient at the [place] level of care (*CSW will initial, date and re-position this form in  chart as items are completed):  Yes   Patient/family provided with [facility] Work Department's list of facilities offering this level of care within the geographic area requested by the patient (or if unable, by the patient's family).  Yes   Patient/family informed of their freedom to choose among providers that offer the needed level of care, that participate in Medicare, Medicaid or managed care program needed by the patient, have an available bed and are willing to accept the patient.  Yes   Patient/family informed of [facility]'s ownership interest in [facility] and [facility], as well as of the fact that they are under no obligation to receive care at these facilities.  PASRR submitted to EDS on 06/20/17     PASRR number received on       Existing PASRR number confirmed on  06-20-17     FL2 transmitted to all facilities in geographic area requested by pt/family on 06/20/17     FL2 transmitted to all facilities within larger geographic area on       Patient informed that his/her managed care company has contracts with or will negotiate with certain facilities, including the following:         06-21-17   Patient/family informed of bed offers received.  Patient chooses bed at  [facility] recommends and patient chooses bed at      Patient to be transferred to [facility]   on  06-24-17.  Patient to be transferred to facility by  [facility]     Patient family notified on  06-24-17 of transfer.  Name of family member notified:   Left message on son Owen's voicemail.      PHYSICIAN Please sign FL2, Please  sign DNR     Additional Comment:    _______________________________________________ Ross Ludwig, LCSWA 06/20/2017, 6:03 PM

## 2017-06-20 NOTE — Progress Notes (Signed)
1 unit of PRBCs infused without any signs or symptoms of a reaction.  CBC ordered for 2000.

## 2017-06-20 NOTE — NC FL2 (Signed)
[place] LEVEL OF CARE SCREENING TOOL     IDENTIFICATION  Patient Name: Lindsey Hobbs Birthdate: October 01, 1927 Sex: female Admission Date (Current Location): 06/18/2017  [place] and [place] Number:  Engineering geologist and Address:  [facility], [address], [place], [place] [address]      Provider Number: [id number]  Attending Physician Name and Address:  Max Sane, MD  Relative Name and Phone Number:  [place], [place] [phone]     Current Level of Care: Hospital Recommended Level of Care: [place] Prior Approval Number:    Date Approved/Denied:   PASRR Number: [id number]  Discharge Plan: SNF    Current Diagnoses: Patient Active Problem List   Diagnosis Date Noted  . Chronic deep vein thrombosis (DVT) of right lower extremity ([place]) 06/18/2017  . Dyspnea 06/18/2017  . Acute renal insufficiency 06/18/2017  . Demand ischemia ([place]) 06/18/2017  . Tobacco abuse counseling 06/18/2017  . Right leg DVT ([place]) 06/18/2017  . Ulcerative colitis ([place])   . Anemia 06/04/2017  . Rectal bleed 06/02/2017  . GI bleed 07/05/2015    Orientation RESPIRATION BLADDER Height & Weight     Self, Time, Situation, Place  O2 (2L) Continent Weight: 120 lb 9.6 oz (54.7 kg) Height:  5\' 4"  (162.6 cm)  BEHAVIORAL SYMPTOMS/MOOD NEUROLOGICAL BOWEL NUTRITION STATUS      Continent Diet (Carb Modified)  AMBULATORY STATUS COMMUNICATION OF NEEDS Skin   Limited Assist Verbally Normal                       Personal Care Assistance Level of Assistance  Bathing, Feeding, Dressing Bathing Assistance: Limited assistance Feeding assistance: Independent Dressing Assistance: Limited assistance     Functional Limitations Info  Sight, Hearing, Speech Sight Info: Adequate Hearing Info: Adequate Speech Info: Adequate    SPECIAL CARE FACTORS FREQUENCY  PT (By licensed PT)     PT Frequency: 5x a week               Contractures Contractures Info: Not present    Additional Factors Info  Code Status, Allergies Code Status Info: DNR Allergies Info: NKA           Current Medications (06/20/2017):  This is the current hospital active medication list Current Facility-Administered Medications  Medication Dose Route Frequency Provider Last Rate Last Dose  . acetaminophen (TYLENOL) tablet 650 mg  650 mg Oral Q6H PRN Theodoro Grist, MD   650 mg at 06/20/17 1353   Or  . acetaminophen (TYLENOL) suppository 650 mg  650 mg Rectal Q6H PRN Theodoro Grist, MD      . budesonide (PULMICORT) nebulizer solution 0.25 mg  0.25 mg Nebulization BID Theodoro Grist, MD   0.25 mg at 06/20/17 0804  . dextrose 5 % solution   Intravenous Continuous Manuella Ghazi, Vipul, MD      . docusate sodium (COLACE) capsule 100 mg  100 mg Oral BID Theodoro Grist, MD   100 mg at 06/20/17 1029  . ferrous sulfate tablet 325 mg  325 mg Oral Q breakfast Theodoro Grist, MD   325 mg at 64/33/29 5188  . folic acid (FOLVITE) tablet 1 mg  1 mg Oral Daily Theodoro Grist, MD   1 mg at 06/20/17 1030  . furosemide (LASIX) injection 20 mg  20 mg Intravenous BID Minna Merritts, MD   20 mg at 06/20/17 1050  . gabapentin (NEURONTIN) capsule 100 mg  100 mg Oral Q breakfast  Theodoro Grist, MD   100 mg at 06/20/17 1030  . gabapentin (NEURONTIN) capsule 200 mg  200 mg Oral QHS Theodoro Grist, MD   200 mg at 06/19/17 2221  . guaiFENesin (MUCINEX) 12 hr tablet 600 mg  600 mg Oral BID Theodoro Grist, MD   600 mg at 06/20/17 1029  . ipratropium-albuterol (DUONEB) 0.5-2.5 (3) MG/3ML nebulizer solution 3 mL  3 mL Nebulization Q4H PRN [place], Rima, MD      . ipratropium-albuterol (DUONEB) 0.5-2.5 (3) MG/3ML nebulizer solution 3 mL  3 mL Nebulization Q6H Vaickute, Rima, MD   3 mL at 06/20/17 1325  . loratadine (CLARITIN) tablet 10 mg  10 mg Oral Daily PRN Theodoro Grist, MD      . MEDLINE mouth rinse  15 mL Mouth Rinse BID Theodoro Grist, MD   15 mL at 06/20/17 1110   . mesalamine (LIALDA) EC tablet 2.4 g  2.4 g Oral Q breakfast Theodoro Grist, MD   2.4 g at 06/20/17 1029  . metoprolol tartrate (LOPRESSOR) tablet 12.5 mg  12.5 mg Oral BID Theodoro Grist, MD   12.5 mg at 06/20/17 1030  . multivitamin with minerals tablet 1 tablet  1 tablet Oral Daily Theodoro Grist, MD   1 tablet at 06/20/17 1029  . neomycin-bacitracin-polymyxin (NEOSPORIN) ointment   Topical PRN Vaughan Basta, MD      . nicotine (NICODERM CQ - dosed in mg/24 hours) patch 21 mg  21 mg Transdermal Daily Theodoro Grist, MD   21 mg at 06/20/17 1032  . omega-3 acid ethyl esters (LOVAZA) capsule 1 g  1 g Oral Daily Theodoro Grist, MD   1 g at 06/20/17 1029  . ondansetron (ZOFRAN) tablet 4 mg  4 mg Oral Q6H PRN Theodoro Grist, MD       Or  . ondansetron (ZOFRAN) injection 4 mg  4 mg Intravenous Q6H PRN Theodoro Grist, MD      . pantoprazole (PROTONIX) EC tablet 40 mg  40 mg Oral Daily PRN Theodoro Grist, MD      . senna (SENOKOT) tablet 8.6 mg  1 tablet Oral BID Theodoro Grist, MD   8.6 mg at 06/20/17 1030  . technetium TC 34M diethylenetriame-pentaacetic acid (DTPA) injection 08.81 millicurie  10.31 millicurie Intravenous Once PRN [place], David A, MD      . zolpidem Lorrin Mais) tablet 5 mg  5 mg Oral QHS PRN Theodoro Grist, MD   5 mg at 06/18/17 2127     Discharge Medications: Please see discharge summary for a list of discharge medications.  Relevant Imaging Results:  Relevant Lab Results:   Additional Information SSN [id number]  Ross Ludwig, [place]

## 2017-06-20 NOTE — Consult Note (Signed)
Consultation Note Date: 06/20/17  Patient Name: Lindsey Hobbs  DOB: 10-17-1927  MRN: [id number]  Age / Sex: 83 y.o., female  PCP: Cletis [place], MD Referring Physician: Max Sane, MD  Reason for Consultation: Establishing goals of care  HPI/Patient Profile: 83 y.o. female  with past medical history of hypertension, hyperlipidemia, guillain barre syndrome, Crohn's disease, COPD, CHF, avascular necrosis of hip, anemia, DVT, and smoker admitted on 06/18/2017 with shortness of breath and leg swelling. Chest xray revealed acute CHF, pleural effusions, and cardiomegaly. Followed by cardiology. EF 35-40%. Receiving lasix. Not a good candidate for cardiac catheterization secondary to age and frailty. Myoview in AM. Positive for right lower extremity DVT. VQ scan with low probability for PE. Vascular surgery deferring IVC filter and recommending aspirin when appropriate (recent hospitalization for GI bleed related to Crohn's). Hemoglobin 8.3--receiving 1 unit PRBC 8/16. Palliative medicine consultation for goals of care.   Clinical Assessment and Goals of Care: I have reviewed medical records, discussed with Dr. Manuella Ghazi, and met with patient at bedside to discuss diagnosis, [place], EOL wishes, disposition and options. Patient awake, alert, oriented. Tells me she "doesn't feel good today." Will be receiving blood transfusion. Able to participate in [place] conversation. Shortly after, I spoke with her son Roxy Manns goes by Lindsey Hobbs) via telephone.   Introduced Palliative Medicine as specialized medical care for people living with serious illness. It focuses on providing relief from the symptoms and stress of a serious illness. The goal is to improve quality of life for both the patient and the family.  We discussed a brief life review of the patient. Prior to hospitalization, living home independently. Able to perform ADL's and still driving  up until previous hospitalization for GI bleed. She has had Home Instead in the home. Good appetite. Denies weight loss. Butch recalls her previous hospitalization for GI bleed related to Crohn's. She did have a colonoscopy at that time. He felt she was discharged too early because she is very weak.   Discussed hospital diagnoses and interventions. Patient and son tell me CHF and COPD are "new." He is not surprised by the COPD because she has smoked for 60+ years. Educated on disease trajectories of COPD and CHF. Butch understands we cannot anticoagulate her at this time due to recent GI bleed. I explained she is not a candidate for invasive cardiac catheterization due to her age, frailty, and recent GIB. He is agreeable with stress test tomorrow.   Advanced directives and concepts specific to code status were discussed. Patient tells me Daiva Nakayama is her only child and he has HCPOA and living will paperwork. Confirms DO NOT RESUSCITATE. Encouraged son via telephone to bring documented living will/HCPOA paperwork to hospital to be scanned into epic.   I attempted to elicit values and goals of care important to the patient. Patient with very short responses to my questions but remaining independent is important. Ms. Mudgett understands she is very weak and agreeable with rehab after hospitalization.    Explained to Butch the concern for  continued decline with multiple co-morbidities with risk for re-hospitalization. Educated Butch on palliative services to continue to follow at rehab on discharge in order to provide extra support and continue goals of care conversations if she does not show improvement. Butch is agreeable.   Questions and concerns were addressed.     SUMMARY OF RECOMMENDATIONS    Patient/son confirm DO NOT RESUSCITATE.   Encouraged son to bring documented living will/HCPOA paperwork to hospital to be scanned.   Patient/son hopeful for improvement at rehab.   Agreeable with palliative  services to follow at SNF on discharge to continue [place] conversations.   Code Status/Advance Care Planning:  DNR  Symptom Management:   Per attending  Palliative Prophylaxis:   Aspiration, Delirium Protocol, Frequent Pain Assessment, Oral Care and Turn Reposition  Additional Recommendations (Limitations, Scope, Preferences):  Full Scope Treatment-except DNR/DNI  Psycho-social/Spiritual:   Desire for further Chaplaincy support: no  Additional Recommendations: Caregiving  Support/Resources and Education on Hospice  Prognosis:   Unable to determine  Discharge Planning: To Be Determined likely SNF for rehab with palliative to follow     Primary Diagnoses: Present on Admission: . Right leg DVT ([place])   I have reviewed the medical record, interviewed the patient and family, and examined the patient. The following aspects are pertinent.  Past Medical History:  Diagnosis Date  . Anemia   . Avascular necrosis of bone of hip ([place])   . CHF (congestive heart failure) ([place])   . COPD (chronic obstructive pulmonary disease) ([place])   . Crohn's disease ([place])   . DVT (deep venous thrombosis) ([place]) 06/2017  . Guillain Barr syndrome ([place])   . Hyperlipidemia   . Hypertension    Social History   Social History  . Marital status: Widowed    Spouse name: N/A  . Number of children: N/A  . Years of education: N/A   Social History Main Topics  . Smoking status: Current Every Day Smoker    Packs/day: 0.50  . Smokeless tobacco: Never Used  . Alcohol use No  . Drug use: Unknown  . Sexual activity: Not Asked   Other Topics Concern  . None   Social History Narrative  . None   Family History  Problem Relation Age of Onset  . Breast cancer Mother    Scheduled Meds: . budesonide (PULMICORT) nebulizer solution  0.25 mg Nebulization BID  . docusate sodium  100 mg Oral BID  . ferrous sulfate  325 mg Oral Q breakfast  . folic acid  1 mg Oral Daily  . furosemide  20 mg  Intravenous BID  . gabapentin  100 mg Oral Q breakfast  . gabapentin  200 mg Oral QHS  . guaiFENesin  600 mg Oral BID  . ipratropium-albuterol  3 mL Nebulization Q6H  . mouth rinse  15 mL Mouth Rinse BID  . mesalamine  2.4 g Oral Q breakfast  . metoprolol tartrate  12.5 mg Oral BID  . multivitamin with minerals  1 tablet Oral Daily  . nicotine  21 mg Transdermal Daily  . omega-3 acid ethyl esters  1 g Oral Daily  . senna  1 tablet Oral BID   Continuous Infusions: . dextrose     PRN Meds:.acetaminophen **OR** acetaminophen, ipratropium-albuterol, loratadine, neomycin-bacitracin-polymyxin, ondansetron **OR** ondansetron (ZOFRAN) IV, pantoprazole, technetium TC 56M diethylenetriame-pentaacetic acid, zolpidem Medications Prior to Admission:  Prior to Admission medications   Medication Sig Start Date End Date Taking? Authorizing Provider  Calcium Carbonate-Vitamin D (CALCIUM 600+D) 600-400  MG-UNIT per tablet Take 1 tablet by mouth 2 (two) times daily.   Yes [provider]  ferrous sulfate 325 (65 FE) MG tablet Take 1 tablet (325 mg total) by mouth 2 (two) times daily with a meal. Patient taking differently: Take 325 mg by mouth daily with breakfast.  07/08/15  Yes Sudini, Alveta Heimlich, MD  fexofenadine (ALLEGRA) 180 MG tablet Take 180 mg by mouth at bedtime.   Yes [provider]  folic acid (FOLVITE) 1 MG tablet Take 1 mg by mouth daily.   Yes [provider]  gabapentin (NEURONTIN) 100 MG capsule Take 100-300 mg by mouth See admin instructions. One in the morning and 2 at night   Yes [provider]  ipratropium-albuterol (DUONEB) 0.5-2.5 (3) MG/3ML SOLN Take 3 mLs by nebulization every 6 (six) hours as needed. 06/06/17  Yes Gladstone Lighter, MD  loratadine (CLARITIN) 10 MG tablet Take 10 mg by mouth daily as needed for allergies.   Yes [provider]  mesalamine (LIALDA) 1.2 g EC tablet Take 2.4 g by mouth daily with breakfast.   Yes [provider]  Multiple Vitamin (MULTIVITAMIN WITH MINERALS) TABS tablet Take 1 tablet by mouth daily.   Yes [provider]  Omega-3 Fatty Acids (FISH OIL PO) Take 1 capsule by mouth 3 (three) times daily.   Yes [provider]  pantoprazole (PROTONIX) 40 MG tablet Take 40 mg by mouth daily. 05/13/17  Yes [provider]  zolpidem (AMBIEN) 10 MG tablet Take 10 mg by mouth at bedtime as needed for sleep.   Yes [provider]  guaiFENesin (MUCINEX) 600 MG 12 hr tablet Take 1 tablet (600 mg total) by mouth 2 (two) times daily. Patient not taking: Reported on 06/18/2017 06/06/17   Gladstone Lighter, MD  predniSONE (DELTASONE) 10 MG tablet Take 5 tablets q daily for 5 days and then 4 tablets q daily for 5 days and 3 tablets q daily for 5 days and 2 tablets q daily for 5 days and then 1 tablet q daily for 5 days and stop Patient not taking: Reported on 06/18/2017 06/06/17   Gladstone Lighter, MD   No Known Allergies Review of Systems  Constitutional: Positive for activity change.  Respiratory: Positive for shortness of breath.   Cardiovascular: Positive for leg swelling.   Physical Exam  Constitutional: She is oriented to person, place, and time. She is cooperative. She appears ill.  HENT:  Head: Normocephalic and atraumatic.  Cardiovascular: Regular rhythm.   Murmur heard. Pulmonary/Chest: She has decreased breath sounds.  Dyspnea with exertion  Abdominal: Normal appearance.  Neurological: She is alert and oriented to person, place, and time.  Skin: Skin is warm and dry. There is pallor.  Psychiatric: She has a normal mood and affect. Her speech is normal. She is withdrawn. Cognition and memory are normal.  Nursing note and vitals reviewed.  Vital Signs: BP (!) 114/44   Pulse 78   Temp 97.6 F (36.4 C) (Oral)   Resp 20   Ht 5' 4" (1.626 m)   Wt 54.7 kg (120 lb 9.6 oz)   SpO2 96%   BMI 20.70 kg/m  Pain Assessment: 0-10 POSS *See Group Information*:  1-Acceptable,Awake and alert Pain Score: 10-Worst pain ever  SpO2: SpO2: 96 % O2 Device:SpO2: 96 % O2 Flow Rate: .O2 Flow Rate (L/min): 2 L/min  IO: Intake/output summary:   Intake/Output Summary (Last 24 hours) at 06/20/17 1647 Last data filed at 06/20/17 1547  Gross  per 24 hour  Intake              530 ml  Output             1000 ml  Net             -470 ml    LBM: Last BM Date: 06/17/17 Baseline Weight: Weight: 57.6 kg (127 lb) Most recent weight: Weight: 54.7 kg (120 lb 9.6 oz)     Palliative Assessment/Data:  PPS 50%   Flowsheet Rows     Most Recent Value  Intake Tab  Referral Department  Hospitalist  Unit at Time of Referral  Cardiac/Telemetry Unit  Palliative Care Primary Diagnosis  Cardiac  Palliative Care Type  New Palliative care  Reason for referral  Clarify Goals of Care  Date first seen by Palliative Care  06/20/17  Clinical Assessment  Palliative Performance Scale Score  50%  Psychosocial & Spiritual Assessment  Palliative Care Outcomes  Patient/Family meeting held?  Yes  Who was at the meeting?  patient and son via telephone  Palliative Care Outcomes  Clarified goals of care, Provided psychosocial or spiritual support, ACP counseling assistance      Time In: 1420 Time Out: 1530 Time Total: 46mn Greater than 50%  of this time was spent counseling and coordinating care related to the above assessment and plan.  Signed by:  MIhor Dow FNP-C Palliative Medicine Team  Phone: [phone]: [phone]  Please contact Palliative Medicine Team phone at [phone] questions and concerns.  For individual provider: See AShea Evans

## 2017-06-20 NOTE — Progress Notes (Signed)
Patient Name: Lindsey Hobbs Date of Encounter: 06/20/2017  Primary Cardiologist: New to [facility] - consult by [facility] Problem List     Active Problems:   Chronic deep vein thrombosis (DVT) of right lower extremity (HCC)   Dyspnea   Acute renal insufficiency   Demand ischemia (HCC)   Tobacco abuse counseling   Right leg DVT (HCC)     Subjective   Breathing about the same. Continues to feel weak. Does not want to ambulate this morning. Renal function stable with diuresis. Documented UOP of 760 mL for the admission. Weight stable at 120 pounds. No CBC this morning. TTE showed reduced EF at 35-40% with moderate MR/TR.   Inpatient Medications    Scheduled Meds: . budesonide (PULMICORT) nebulizer solution  0.25 mg Nebulization BID  . docusate sodium  100 mg Oral BID  . ferrous sulfate  325 mg Oral Q breakfast  . folic acid  1 mg Oral Daily  . furosemide  20 mg Intravenous BID  . gabapentin  100 mg Oral Q breakfast  . gabapentin  200 mg Oral QHS  . guaiFENesin  600 mg Oral BID  . ipratropium-albuterol  3 mL Nebulization Q6H  . mouth rinse  15 mL Mouth Rinse BID  . mesalamine  2.4 g Oral Q breakfast  . metoprolol tartrate  12.5 mg Oral BID  . multivitamin with minerals  1 tablet Oral Daily  . nicotine  21 mg Transdermal Daily  . nitroGLYCERIN  0.5 inch Topical Q8H  . omega-3 acid ethyl esters  1 g Oral Daily  . senna  1 tablet Oral BID  . sodium chloride flush  3 mL Intravenous Q12H   Continuous Infusions: . sodium chloride     PRN Meds: sodium chloride, acetaminophen **OR** acetaminophen, ipratropium-albuterol, loratadine, neomycin-bacitracin-polymyxin, ondansetron **OR** ondansetron (ZOFRAN) IV, pantoprazole, sodium chloride flush, technetium TC 106M diethylenetriame-pentaacetic acid, zolpidem   Vital Signs    Vitals:   06/20/17 0230 06/20/17 0500 06/20/17 0622 06/20/17 0802  BP:   (!) 107/42 (!) 118/48  Pulse:   86 84  Resp:   18 16  Temp:   98 F (36.7  C) 97.7 F (36.5 C)  TempSrc:    Oral  SpO2: 93%  94% 94%  Weight:  120 lb 9.6 oz (54.7 kg)    Height:        Intake/Output Summary (Last 24 hours) at 06/20/17 0822 Last data filed at 06/20/17 0500  Gross per 24 hour  Intake              240 ml  Output              800 ml  Net             -560 ml   Filed Weights   06/18/17 1134 06/19/17 0353 06/20/17 0500  Weight: 127 lb (57.6 kg) 120 lb 12.8 oz (54.8 kg) 120 lb 9.6 oz (54.7 kg)    Physical Exam    GEN: Frail appearing, in no acute distress.  HEENT: Grossly normal.  Neck: Supple, JVD elevated ~ 12 cm, no carotid bruits, or masses. Cardiac: RRR, II/VI systolic murmur, no rubs, or gallops. No clubbing, cyanosis, trace pre-tibial edema bilaterally.  Radials/DP/PT 2+ and equal bilaterally.  Respiratory:  Diminished breath sounds bilaterally with crackles bilaterally. Faint expiratory wheezing bilaterally.  GI: Soft, nontender, distended, BS + x 4. MS: no deformity or atrophy. Skin: warm and dry, no rash. Neuro:  Strength  and sensation are intact. Psych: AAOx3.  Normal affect.  Labs    CBC  Recent Labs  06/18/17 1140 06/19/17 1427  WBC 6.6 8.6  HGB 9.8* 8.3*  HCT 30.0* 26.1*  MCV 97.7 97.7  PLT 392 517   Basic Metabolic Panel  Recent Labs  06/19/17 1427 06/20/17 0430  NA 146* 150*  K 3.9 3.5  CL 107 110  CO2 31 34*  GLUCOSE 125* 97  BUN 15 16  CREATININE 1.13* 1.09*  CALCIUM 8.9 8.5*   Liver Function Tests No results for input(s): AST, ALT, ALKPHOS, BILITOT, PROT, ALBUMIN in the last 72 hours. No results for input(s): LIPASE, AMYLASE in the last 72 hours. Cardiac Enzymes  Recent Labs  06/19/17 0223 06/19/17 0833 06/19/17 1427  TROPONINI 0.21* 0.23* 0.23*   BNP Invalid input(s): POCBNP D-Dimer No results for input(s): DDIMER in the last 72 hours. Hemoglobin A1C No results for input(s): HGBA1C in the last 72 hours. Fasting Lipid Panel No results for input(s): CHOL, HDL, LDLCALC, TRIG,  CHOLHDL, LDLDIRECT in the last 72 hours. Thyroid Function Tests  Recent Labs  06/18/17 1702  TSH 2.047    Telemetry    NSR - Personally Reviewed  ECG    n/a - Personally Reviewed  Radiology    Dg Chest 2 View  Result Date: 06/18/2017 IMPRESSION: COPD. Cardiomegaly, vascular congestion. Small effusions. Electronically Signed   By: Rolm Baptise M.D.   On: 06/18/2017 14:35   Nm Pulmonary Vent And Perf (v/q Scan)  Result Date: 06/18/2017 IMPRESSION: Extensive matched ventilation and perfusion defects. Study is low probability for pulmonary embolus. Electronically Signed   By: Rolm Baptise M.D.   On: 06/18/2017 19:44   US Venous Img Lower Bilateral  Result Date: 06/18/2017 IMPRESSION: 1. 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 2. No additional deep venous thrombosis in either extremity. These results will be called to the ordering clinician or representative by the Radiologist Assistant, and communication documented in the PACS or zVision Dashboard. Electronically Signed   By: Titus Dubin M.D.   On: 06/18/2017 14:38    Cardiac Studies   TTE 06/18/17: Study Conclusions  - Left ventricle: The cavity size was mildly dilated. Wall   thickness was normal. Systolic function was moderately reduced.   The estimated ejection fraction was in the range of 35% to 40%.   Regional wall motion abnormalities cannot be excluded. - Mitral valve: There was moderate regurgitation. - Left atrium: The atrium was mildly dilated. - Right ventricle: The cavity size was mildly dilated. Wall   thickness was normal. - Right atrium: The atrium was mildly dilated. - Tricuspid valve: There was moderate regurgitation.  Impressions:  - Mild to moderately reduced overall left ventricular function   globally depressed   Ejection fraction between 35-40%   bi atrial enlargement   moderate to severe tricuspid and mitral regurgitation   moderate to severe  thickness of the mitral valve. No cardiac   source of emboli was indentified.  Patient Profile     83 y.o. female with history of Crohn's disease with recent GI bleed in 05/2017, posthemorrhagic anemia requiring blood transfusion, COPD, ongoing tobacco abuse, HTN, and HLD who presented to [facility] with generalized weakness, SOB, PND, orthopnea, and LE swelling. She was found to have acute systolic CHF.   Assessment & Plan    1. Acute respiratory distress with hypoxia: -Likely multifactorial including acute systolic CHF, anemia, and AECOPD -Wean oxygen as able  per IM  2. Acute systolic CHF: -TTE as above -Continue IV Lasix with KCl repletion -Will not advance metoprolol given mild wheezing on exam -As BP allows, consider starting ACEi/ARB -Prior to discharge, consider adding spironolactone  -Given newly found CM, she would benefit from ischemic evaluation with cardiac cath. Unfortunately, she is not a good cardiac cath candidate given her recent GI bleed with anemia requiring transfusion, frail state, weakness, and advanced age -Consider Myoview when respiratory status is improved  3. Elevated troponin: -No chest pain -Flat trending -Not on heparin gtt or ASA given anemia/recent GI bleed -Discontinue nitro paste  4. Valvular heart disease: -TTE as above -Not a good invasive candidate given her frail state, advanced age, and anemia -Monitor  5. RLE DVT: -Seen by vascular -Given location of DVT, less likely to embolize to lungs -No indication for IVC filter at this time -Not a good candidate for anticoagulation given anemia  6. GI bleed/anemia: -Not a good candidate for anticoagulation  -Monitor HGB Per IM -Maintain HGB > 8.5 -Iron  7. AECOPD: -Per IM  Signed, Christell Faith, PA-C [place] Pager: [phone] 06/20/2017, 8:22 AM   Attending Note Patient seen and examined, agree with detailed note above,  Patient presentation and plan discussed on rounds.   Feels  weak, supine in bed, no distress Denies any leg swelling Improving shortness of breath Previous results discussed with her Echocardiogram showing ejection fraction 35-40%, moderate TR, moderate MR Globally depressed LV  On physical examination  no distress laying supine,  lungs are clear with dullness at the bases   heart sounds regular normal K2-I0 , 2/6 systolic ejection murmur heard left sternal border, Abdomen soft nontender, no significant leg edema  Lab work reviewed creatinine 1.09, BUN 15, hematocrit 26  Profile: 83 year old woman with history of Crohn's disease, GI bleed July 2018, recent blood transfusion, COPD with ongoing tobacco abuse, hypertension, hyperlipidemia, presenting with weakness, shortness of breath, PND and orthopnea with leg swelling Recent GI bleeding history  She presented with CHF symptoms, In the emergency room BNP 2600, troponin 0.24 non-trending Chest x-ray with pulmonary edema and effusions, Lower extremity Doppler showing DVT on the right, VQ scan low probability for PE She's not on anticoagulation given recent GI bleed  --- Acute respiratory distress with hypoxia  underlying COPD, anemia, acute systolic CHF  cardiomyopathy ejection fraction 35% with moderate to severe MR TR Continue Lasix IV twice a day not a good candidate for cardiac catheterization given anemia and unable to tolerate aspirin and Plavix -Consider Myoview tomorrow, pharmacologic study  --Elevated troponin   non-trending, Likely demand ischemia from anemia, hypoxia Unable to exclude underlying coronary disease  --- DVT Seen by vascular surgery, no filter indicated at this time  --- Anemia Stable compared to yesterday She is on iron  Case discussed with Dr. Manuella Ghazi hospitalist service  Total encounter time more than 25 minutes  Greater than 50% was spent in counseling and coordination of care with the patient   Signed: Esmond Plants  M.D., Ph.D. [facility]

## 2017-06-20 NOTE — Clinical Social Work Note (Signed)
CSW spoke to patient and her son and she is in agreement to going to SNF for short term rehab.  Patient stated she has been at [facility] and [facility] in the past, and would prefer to return to one of those facilities if they are available.  CSW was given permission to begin bed search process in [facility].  CSW to continue to work on placement for patient, formal assessment to follow.  Jones Broom. [place], MSW, [place]  06/20/2017 5:55 PM

## 2017-06-21 ENCOUNTER — Inpatient Hospital Stay (HOSPITAL_BASED_OUTPATIENT_CLINIC_OR_DEPARTMENT_OTHER): Payer: Medicare Other

## 2017-06-21 ENCOUNTER — Ambulatory Visit: Payer: Medicare Other | Admitting: Podiatry

## 2017-06-21 ENCOUNTER — Encounter: Payer: Self-pay | Admitting: Radiology

## 2017-06-21 DIAGNOSIS — Z515 Encounter for palliative care: Secondary | ICD-10-CM

## 2017-06-21 DIAGNOSIS — J441 Chronic obstructive pulmonary disease with (acute) exacerbation: Secondary | ICD-10-CM

## 2017-06-21 DIAGNOSIS — I5021 Acute systolic (congestive) heart failure: Secondary | ICD-10-CM

## 2017-06-21 DIAGNOSIS — I509 Heart failure, unspecified: Secondary | ICD-10-CM

## 2017-06-21 DIAGNOSIS — Z7189 Other specified counseling: Secondary | ICD-10-CM

## 2017-06-21 LAB — TYPE AND SCREEN
ABO/RH(D): A NEG
Antibody Screen: NEGATIVE
UNIT DIVISION: 0

## 2017-06-21 LAB — CBC
HCT: 32.3 % — ABNORMAL LOW (ref 35.0–47.0)
Hemoglobin: 10.6 g/dL — ABNORMAL LOW (ref 12.0–16.0)
MCH: 31.6 pg (ref 26.0–34.0)
MCHC: 32.8 g/dL (ref 32.0–36.0)
MCV: 96.4 fL (ref 80.0–100.0)
PLATELETS: 301 10*3/uL (ref 150–440)
RBC: 3.35 MIL/uL — AB (ref 3.80–5.20)
RDW: 23.3 % — ABNORMAL HIGH (ref 11.5–14.5)
WBC: 5.9 10*3/uL (ref 3.6–11.0)

## 2017-06-21 LAB — BPAM RBC
BLOOD PRODUCT EXPIRATION DATE: 201809062359
ISSUE DATE / TIME: 201808161530
Unit Type and Rh: 600

## 2017-06-21 LAB — NM MYOCAR MULTI W/SPECT W/WALL MOTION / EF
CHL CUP NUCLEAR SSS: 9
CHL CUP RESTING HR STRESS: 77 {beats}/min
LV dias vol: 86 mL (ref 46–106)
LVSYSVOL: 43 mL
NUC STRESS TID: 1.03
Peak HR: 97 {beats}/min
Percent HR: 72 %
SDS: 7
SRS: 6

## 2017-06-21 LAB — BASIC METABOLIC PANEL
ANION GAP: 5 (ref 5–15)
BUN: 13 mg/dL (ref 6–20)
CO2: 37 mmol/L — ABNORMAL HIGH (ref 22–32)
Calcium: 8.7 mg/dL — ABNORMAL LOW (ref 8.9–10.3)
Chloride: 106 mmol/L (ref 101–111)
Creatinine, Ser: 0.89 mg/dL (ref 0.44–1.00)
GFR calc Af Amer: >60 mL/min (ref >60)
GFR, EST NON AFRICAN AMERICAN: 57 mL/min — AB (ref >60)
GLUCOSE: 98 mg/dL (ref 65–99)
POTASSIUM: 3.8 mmol/L (ref 3.5–5.1)
SODIUM: 148 mmol/L — AB (ref 135–145)

## 2017-06-21 LAB — GLUCOSE, CAPILLARY: GLUCOSE-CAPILLARY: 101 mg/dL — AB (ref 65–99)

## 2017-06-21 MED ORDER — DEXTROSE 5 % IV SOLN
INTRAVENOUS | Status: DC
  Administered 2017-06-21: 22:00:00 via INTRAVENOUS

## 2017-06-21 MED ORDER — TECHNETIUM TC 99M TETROFOSMIN IV KIT
32.9600 | PACK | Freq: Once | INTRAVENOUS | Status: AC | PRN
  Administered 2017-06-21: 32.96 via INTRAVENOUS

## 2017-06-21 MED ORDER — REGADENOSON 0.4 MG/5ML IV SOLN
0.4000 mg | Freq: Once | INTRAVENOUS | Status: AC
  Administered 2017-06-21: 0.4 mg via INTRAVENOUS

## 2017-06-21 MED ORDER — TECHNETIUM TC 99M TETROFOSMIN IV KIT
13.0000 | PACK | Freq: Once | INTRAVENOUS | Status: AC | PRN
  Administered 2017-06-21: 13.07 via INTRAVENOUS

## 2017-06-21 MED ORDER — GUAIFENESIN 100 MG/5ML PO SOLN
5.0000 mL | Freq: Four times a day (QID) | ORAL | Status: DC | PRN
  Filled 2017-06-21: qty 5

## 2017-06-21 NOTE — Plan of Care (Signed)
Problem: Physical Regulation: Goal: Ability to maintain clinical measurements within normal limits will improve Outcome: Progressing Hemoglobin still Hobbs but much improved since yesterday (10.6). Will continue to monitor. Lindsey Hobbs [facility]

## 2017-06-21 NOTE — Progress Notes (Signed)
Stress test reviewed Small region of ischemia inferolateral region Small region of defect anteroseptal wall  Given  recent GI bleed, frail state, medical management at this time  Signed, Esmond Plants, MD, Ph.D [facility]

## 2017-06-21 NOTE — Progress Notes (Signed)
Patient off floor for testing. Vital signs will be taken around noon. Wenda Low [facility]

## 2017-06-21 NOTE — Care Management Important Message (Signed)
Important Message  Patient Details  Name: Lindsey Hobbs MRN: [id number] Date of Birth: Feb 06, 1927   Medicare Important Message Given:  Yes Signed IM notice given  Katrina Stack, RN 06/21/2017, 1:02 PM

## 2017-06-21 NOTE — Progress Notes (Addendum)
Pharmacist Heart Failure Core Measure Documentation  Assessment: Lindsey Hobbs has an EF documented as 35-40% on 06/18/17 by ECHO.  Rationale: Heart failure patients with left ventricular systolic dysfunction (LVSD) and an EF < 40% should be prescribed an angiotensin converting enzyme inhibitor (ACEI) or angiotensin receptor blocker (ARB) at discharge unless a contraindication is documented in the medical record.  This patient is not currently on an ACEI or ARB for HF.  This note is being placed in the record in order to provide documentation that a contraindication to the use of these agents is present for this encounter.  ACE Inhibitor or Angiotensin Receptor Blocker is contraindicated (specify all that apply)  []   ACEI allergy AND ARB allergy []   Angioedema []   Moderate or severe aortic stenosis []   Hyperkalemia [x]   Hypotension []   Renal artery stenosis []   Worsening renal function, preexisting renal disease or dysfunction  Discussed with rounding hospitalist.   Rocky Morel 06/21/2017 2:00 PM

## 2017-06-21 NOTE — Clinical Social Work Note (Signed)
CSW presented bed offers to patient and she requested CSW to speak to her son.  CSW contacted patient's son and he chose [facility] SNF,.  CSW updated [facility] who said they can accept patient once she is medically ready for discharge and orders have been received.  Jones Broom. [place], MSW, [place]  06/21/2017 4:31 PM

## 2017-06-21 NOTE — Progress Notes (Signed)
Patient Name: Lindsey Hobbs Date of Encounter: 06/21/2017  Primary Cardiologist: New to [facility] - consult by [facility] Problem List     Active Problems:   Chronic deep vein thrombosis (DVT) of right lower extremity (HCC)   Dyspnea   Acute renal insufficiency   Demand ischemia (HCC)   Tobacco abuse counseling   Right leg DVT ([place])     Subjective   Breathing improved this morning. Received 1 unit of 1RBC on 8/16 with improvement in HGB to 10.3 this AM. Continues to feel weak, though improved. Renal function stable with diuresis. Documented UOP of 406 mL for the admission. Weight down 4 pounds this morning at 116 from 120 pounds. TTE showed reduced EF at 35-40% with moderate MR/TR. She is for [facility] this AM. No chest pain.   Inpatient Medications    Scheduled Meds: . budesonide (PULMICORT) nebulizer solution  0.25 mg Nebulization BID  . docusate sodium  100 mg Oral BID  . ferrous sulfate  325 mg Oral Q breakfast  . folic acid  1 mg Oral Daily  . furosemide  20 mg Intravenous BID  . gabapentin  100 mg Oral Q breakfast  . gabapentin  200 mg Oral QHS  . guaiFENesin  600 mg Oral BID  . ipratropium-albuterol  3 mL Nebulization BID  . mouth rinse  15 mL Mouth Rinse BID  . mesalamine  2.4 g Oral Q breakfast  . metoprolol tartrate  12.5 mg Oral BID  . multivitamin with minerals  1 tablet Oral Daily  . nicotine  21 mg Transdermal Daily  . omega-3 acid ethyl esters  1 g Oral Daily  . senna  1 tablet Oral BID   Continuous Infusions: . dextrose 75 mL/hr at 06/21/17 0647   PRN Meds: acetaminophen **OR** acetaminophen, ipratropium-albuterol, loratadine, neomycin-bacitracin-polymyxin, ondansetron **OR** ondansetron (ZOFRAN) IV, pantoprazole, technetium TC 1M diethylenetriame-pentaacetic acid, zolpidem   Vital Signs    Vitals:   06/20/17 2047 06/20/17 2050 06/20/17 2221 06/21/17 0522  BP:   (!) 112/41 (!) 114/48  Pulse:   82 81  Resp:    18  Temp:    (!) 97.5  F (36.4 C)  TempSrc:    Oral  SpO2: 96% 96%  98%  Weight:    116 lb 11.2 oz (52.9 kg)  Height:        Intake/Output Summary (Last 24 hours) at 06/21/17 0721 Last data filed at 06/21/17 0523  Gross per 24 hour  Intake          1203.75 ml  Output              850 ml  Net           353.75 ml   Filed Weights   06/19/17 0353 06/20/17 0500 06/21/17 0522  Weight: 120 lb 12.8 oz (54.8 kg) 120 lb 9.6 oz (54.7 kg) 116 lb 11.2 oz (52.9 kg)    Physical Exam    GEN: Frail appearing, in no acute distress.  HEENT: Grossly normal.  Neck: Supple, JVD elevated ~ 12 cm, no carotid bruits, or masses. Cardiac: RRR, II/VI systolic murmur, no rubs, or gallops. No clubbing, cyanosis, trace pre-tibial edema bilaterally.  Radials/DP/PT 2+ and equal bilaterally.  Respiratory:  Diminished breath sounds bilaterally with crackles bilaterally. Faint expiratory wheezing bilaterally.  GI: Soft, nontender, distended, BS + x 4. MS: no deformity or atrophy. Skin: warm and dry, no rash. Neuro:  Strength and sensation are intact. Psych:  AAOx3.  Normal affect.  Labs    CBC  Recent Labs  06/20/17 2018 06/21/17 0421  WBC 7.3 5.9  HGB 10.8* 10.6*  HCT 33.2* 32.3*  MCV 97.0 96.4  PLT 328 010   Basic Metabolic Panel  Recent Labs  06/20/17 0430 06/21/17 0421  NA 150* 148*  K 3.5 3.8  CL 110 106  CO2 34* 37*  GLUCOSE 97 98  BUN 16 13  CREATININE 1.09* 0.89  CALCIUM 8.5* 8.7*   Liver Function Tests No results for input(s): AST, ALT, ALKPHOS, BILITOT, PROT, ALBUMIN in the last 72 hours. No results for input(s): LIPASE, AMYLASE in the last 72 hours. Cardiac Enzymes  Recent Labs  06/19/17 0223 06/19/17 0833 06/19/17 1427  TROPONINI 0.21* 0.23* 0.23*   BNP Invalid input(s): POCBNP D-Dimer No results for input(s): DDIMER in the last 72 hours. Hemoglobin A1C No results for input(s): HGBA1C in the last 72 hours. Fasting Lipid Panel No results for input(s): CHOL, HDL, LDLCALC, TRIG,  CHOLHDL, LDLDIRECT in the last 72 hours. Thyroid Function Tests  Recent Labs  06/18/17 1702  TSH 2.047    Telemetry    NSR with occasional PACs/PVCs- Personally Reviewed  ECG    n/a - Personally Reviewed  Radiology    Dg Chest 2 View  Result Date: 06/18/2017 IMPRESSION: COPD. Cardiomegaly, vascular congestion. Small effusions. Electronically Signed   By: Rolm Baptise M.D.   On: 06/18/2017 14:35   Nm Pulmonary Vent And Perf (v/q Scan)  Result Date: 06/18/2017 IMPRESSION: Extensive matched ventilation and perfusion defects. Study is low probability for pulmonary embolus. Electronically Signed   By: Rolm Baptise M.D.   On: 06/18/2017 19:44   US Venous Img Lower Bilateral  Result Date: 06/18/2017 IMPRESSION: 1. 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 2. No additional deep venous thrombosis in either extremity. These results will be called to the ordering clinician or representative by the Radiologist Assistant, and communication documented in the PACS or zVision Dashboard. Electronically Signed   By: Titus Dubin M.D.   On: 06/18/2017 14:38    Cardiac Studies   TTE 06/18/17: Study Conclusions  - Left ventricle: The cavity size was mildly dilated. Wall   thickness was normal. Systolic function was moderately reduced.   The estimated ejection fraction was in the range of 35% to 40%.   Regional wall motion abnormalities cannot be excluded. - Mitral valve: There was moderate regurgitation. - Left atrium: The atrium was mildly dilated. - Right ventricle: The cavity size was mildly dilated. Wall   thickness was normal. - Right atrium: The atrium was mildly dilated. - Tricuspid valve: There was moderate regurgitation.  Impressions:  - Mild to moderately reduced overall left ventricular function   globally depressed   Ejection fraction between 35-40%   bi atrial enlargement   moderate to severe tricuspid and mitral regurgitation    moderate to severe thickness of the mitral valve. No cardiac   source of emboli was indentified.  Patient Profile     83 y.o. female with history of Crohn's disease with recent GI bleed in 05/2017, posthemorrhagic anemia requiring blood transfusion, COPD, ongoing tobacco abuse, HTN, and HLD who presented to [facility] with generalized weakness, SOB, PND, orthopnea, and LE swelling. She was found to have acute systolic CHF.   Assessment & Plan    1. Acute respiratory distress with hypoxia: -Improving -Likely multifactorial including acute systolic CHF, anemia, and AECOPD -Wean oxygen as able per IM  2. Acute systolic CHF: -TTE as above -Continue IV Lasix with KCl repletion -Continue metoprolol -As BP allows, consider starting ACEi/ARB -Prior to discharge, consider adding spironolactone  -Given newly found CM, she would benefit from ischemic evaluation with cardiac cath. Unfortunately, she is not a good cardiac cath candidate given her recent GI bleed with anemia requiring transfusion, frail state, weakness, and advanced age -She is for Lexiscan Myoview this morning  3. Elevated troponin: -No chest pain -Flat trending -Not on heparin gtt or ASA given anemia/recent GI bleed -Discontinue nitro paste -Lexiscan Myoview this AM as above  4. Valvular heart disease: -TTE as above -Not a good invasive candidate given her frail state, advanced age, and anemia -Monitor  5. RLE DVT: -Seen by vascular -Given location of DVT, less likely to embolize to lungs -No indication for IVC filter at this time -Not a good candidate for anticoagulation given anemia  6. GI bleed/anemia: -Not a good candidate for anticoagulation  -Status post 1 unit pRBC on 8/16 -Monitor HGB Per IM -Maintain HGB > 8.5 -Iron  7. AECOPD: -Per IM  Signed, Christell Faith, PA-C [place] Pager: [phone] 06/21/2017, 7:21 AM   Attending Note Patient seen and examined, agree with detailed note above,  Patient  presentation and plan discussed on rounds.   supine in bed, no distress Denies any leg swelling, Shortness of breath Reason for stress test discussed with her in detail, scheduled this morning ejection fraction 35-40%, moderate TR, moderate MR, Globally depressed LV  On physical examination  no distress lungs are clear   heart sounds regular normal P7-X4 , 2/6 systolic ejection  Abdomen soft nontender, no significant leg edema  Lab work reviewed creatinine 1.09, BUN 15, hematocrit 26  Profile: 83 year old woman with history of Crohn's disease, GI bleed July 2018, recent blood transfusion, COPD with ongoing tobacco abuse, hypertension, hyperlipidemia, presenting with weakness, shortness of breath, PND and orthopnea with leg swelling Recent GI bleeding history  She presented with CHF symptoms, In the emergency room BNP 2600, troponin 0.24 non-trending Chest x-ray with pulmonary edema and effusions, Lower extremity Doppler showing DVT on the right, VQ scan low probability for PE She's not on anticoagulation given recent GI bleed  --- Acute respiratory distress with hypoxia  Stable respiratory status the past 14 hours underlying COPD, anemia, acute systolic CHF cardiomyopathy ejection fraction 35% with moderate to severe MR TR Continue Lasix IV twice a day, she has stable BMP I/O may not be accurate not a good candidate for cardiac catheterization given anemia and unable to tolerate aspirin and Plavix Myoview  pharmacologic study today If no high-risk ischemia, no further ischemic workup would likely be needed  --Elevated troponin  non-trending, Likely demand ischemia from anemia, hypoxia Myoview scheduled today  --- DVT Seen by vascular surgery, no filter indicated at this time  --- Anemia Improved, hemoglobin 10.6   Total encounter time more than 25 minutes  Greater than 50% was spent in counseling and coordination of care with the patient   Signed: Esmond Plants M.D., Ph.D. [facility]

## 2017-06-21 NOTE — Progress Notes (Signed)
[place] [facility] [place] NAME: Lindsey Hobbs    MR#:  [id number]  DATE OF BIRTH:  Jun 28, 1927  SUBJECTIVE:  CHIEF COMPLAINT:   Chief Complaint  Patient presents with  . Shortness of Breath  . Leg Swelling  Weak, sodium trending down now 150->148, feels some better waiting for myoview REVIEW OF SYSTEMS:  CONSTITUTIONAL: No fever,positive for fatigue or weakness.  EYES: No blurred or double vision.  EARS, NOSE, AND THROAT: No tinnitus or ear pain.  RESPIRATORY: No cough,positive for shortness of breath, wheezing or hemoptysis.  CARDIOVASCULAR: No chest pain, orthopnea, edema.  GASTROINTESTINAL: No nausea, vomiting, diarrhea or abdominal pain.  GENITOURINARY: No dysuria, hematuria.  ENDOCRINE: No polyuria, nocturia,  HEMATOLOGY: No anemia, easy bruising or bleeding SKIN: No rash or lesion. MUSCULOSKELETAL: No joint pain or arthritis.   NEUROLOGIC: No tingling, numbness, weakness.  PSYCHIATRY: No anxiety or depression.   ROS  DRUG ALLERGIES:  No Known Allergies VITALS:  Blood pressure (!) 121/54, pulse 82, temperature 98 F (36.7 C), resp. rate 18, height 5\' 4"  (1.626 m), weight 52.9 kg (116 lb 11.2 oz), SpO2 93 %. PHYSICAL EXAMINATION:  GENERAL:  83 y.o.-year-old patient lying in the bed with no acute distress.  EYES: Pupils equal, round, reactive to light and accommodation. No scleral icterus. Extraocular muscles intact.  HEENT: Head atraumatic, normocephalic. Oropharynx and nasopharynx clear.  NECK:  Supple, no jugular venous distention. No thyroid enlargement, no tenderness.  LUNGS: Normal breath sounds bilaterally, no wheezing, some crepitation. No use of accessory muscles of respiration.  CARDIOVASCULAR: S1, S2 normal. No murmurs, rubs, or gallops.  ABDOMEN: Soft, nontender, nondistended. Bowel sounds present. No organomegaly or mass.  EXTREMITIES: No pedal edema, cyanosis, or clubbing.  NEUROLOGIC: Cranial nerves II through XII are  intact. Muscle strength 4/5 in all extremities. Sensation intact. Gait not checked.  PSYCHIATRIC: The patient is alert and oriented x 3.  SKIN: No obvious rash, lesion, or ulcer.   Physical Exam LABORATORY PANEL:   CBC  Recent Labs Lab 06/21/17 0421  WBC 5.9  HGB 10.6*  HCT 32.3*  PLT 301   ------------------------------------------------------------------------------------------------------------------  Chemistries   Recent Labs Lab 06/21/17 0421  NA 148*  K 3.8  CL 106  CO2 37*  GLUCOSE 98  BUN 13  CREATININE 0.89  CALCIUM 8.7*   ------------------------------------------------------------------------------------------------------------------  Cardiac Enzymes  Recent Labs Lab 06/19/17 0833 06/19/17 1427  TROPONINI 0.23* 0.23*   ------------------------------------------------------------------------------------------------------------------  RADIOLOGY:  Nm Myocar Multi W/spect W/wall Motion / Ef  Result Date: 06/21/2017 Pharmacological myocardial perfusion imaging study with small region of ischemia noted in the inferolateral wall from basal to apical region Ischemia There is also a small region of fixed defect in the basal to apical anteroseptal region Inferolateral wall hypokinesis, EF estimated at 40% No EKG changes concerning for ischemia at peak stress or in recovery. Moderate  risk scan given small region of ischemia, depressed EF. Signed, Esmond Plants, MD, Ph.D [facility]    ASSESSMENT AND PLAN:   Active Problems:   Chronic deep vein thrombosis (DVT) of right lower extremity (HCC)   Dyspnea   Acute renal insufficiency   Demand ischemia (HCC)   Tobacco abuse counseling   Right leg DVT (HCC)   Acute on chronic congestive heart failure (HCC)   COPD exacerbation ([place])   Palliative care by specialist   Goals of care, counseling/discussion   * Hypernatremia -Continue D5 water, sodium improved from 150->148 - monitor her sodium   *  Acute right  lower extremity DVT  - unable to use anticoagulants due to recent gastrointestinal bleed, vascular surgery does not recommend IVC filter placement - - VQ scan - low probability for PE. - Vascular surgery recommends treatment with aspirin if and when she is able to use it  * Acute hypoxic respiratory failure - likely due to COPD/CHF exacerbation  * Acute Systolic CHF - 2-D echo shows LVEF of 35-40% - Continue IV Lasix -Myoview showed small region of ischemia noted in the inferolateral wall from basal to apical region Ischemia Inferolateral wall hypokinesis, EF estimated at 40%,  - Considering her history of GI bleed, She is a not a good candidate for cardiac catheterization  * Elevated troponin - Likely supply demand ischemia -Myoview results as above - Asymptomatic   * Acute renal insufficiency: Close to baseline, monitor   * GI bleed/acute blood loss anemia  - Hemoglobin 8.3->10.6 received 1 unit of blood transfusion on 08/16 - Not a good candidate for long-term anticoagulation  - Considering her cardiac history.  Cardiology recommends keeping hemoglobin above 8.5.    * COPD - Seems stable, no wheezing   * Tobacco abuse counseling, discussed this patient for 4 minutes, nicotine replacement therapy is initiated. The patient is agreeable    Likely discharge to rehab with palliative care to follow.  poor prognosis  All the records are reviewed and case discussed with Care Management/Social Worker. Management plans discussed with the patient,.  Nursing  and they are in agreement.  CODE STATUS:DO NOT RESUSCITATE, palliative care consultation  TOTAL TIME TAKING CARE OF THIS PATIENT: 35 minutes.     POSSIBLE D/C IN 2-3 DAYS, DEPENDING ON CLINICAL CONDITION.   Max Sane M.D on 06/21/2017   Between 7am to 6pm - Pager - [phone]  After 6pm go to [email or web address] - password [email or web address] [facility]  Office  [phone]  CC: Primary care physician;  Cletis Athens, MD  Note: This dictation was prepared with Dragon dictation along with smaller phrase technology. Any transcriptional errors that result from this process are unintentional.

## 2017-06-21 NOTE — Progress Notes (Signed)
Physical Therapy Treatment Patient Details Name: Lindsey Hobbs MRN: [id number] DOB: May 08, 1927 Today's Date: 06/21/2017    History of Present Illness 83 y.o. female with a known history of Recent admission for acute posthemorrhagic anemia due to gastrointestinal bleed, status post colonoscopy 06/05/2017 by Dr. Vicente Males, revealing mucosal ulceration, sigmoid stricture, blood in colon, but not actively bleeding, which was felt to be related to Crohn's disease, COPD, ongoing tobacco abuse, hypertension, hyperlipidemia, who presents to the hospital with complaints of shortness of breath for the past 1-2 weeks. Her oxygen saturation and emergency room was 86% on room air.  She was found to have L DVT, negative for PE, not a condidate for blood thinners or IVC.  Toponins are elevated but stable.    PT Comments    Pt did well with ambulation and mobility but was had elevated HR (up to 115) and drop in O2 on room air (81%).  She was very pleasant, motivated and did not have excessive shortness of breath, but was very fatigued after the effort.  On 2 liters at rest post session and still only up to 89%, nursing notified.  Pt is able to ambulate safely but is not at her baseline.     Follow Up Recommendations  SNF     Equipment Recommendations       Recommendations for Other Services       Precautions / Restrictions Precautions Precautions: Fall Restrictions Weight Bearing Restrictions: No    Mobility  Bed Mobility Overal bed mobility: Modified Independent             General bed mobility comments: Pt sitting at EOB on arrival, able to get LEs back into bed to get to supine  Transfers Overall transfer level: Modified independent Equipment used: Rolling walker (2 wheeled)             General transfer comment: Pt able to rise w/o safety issues and no need for physical assist, only minimal cuing  Ambulation/Gait Ambulation/Gait assistance: Min guard Ambulation Distance (Feet): 125  Feet Assistive device: Rolling walker (2 wheeled)       General Gait Details: Pt walks with a little more speed and confidence today, but still had elevated HR (100-110) and more signficantly had O2 drop to the low 80s.  She did not feel overly short of breath but was tired with the effort.    Stairs            Wheelchair Mobility    Modified Rankin (Stroke Patients Only)       Balance Overall balance assessment: Needs assistance   Sitting balance-Leahy Scale: Good       Standing balance-Leahy Scale: Fair                              Cognition Arousal/Alertness: Awake/alert Behavior During Therapy: WFL for tasks assessed/performed Overall Cognitive Status: Within Functional Limits for tasks assessed                                        Exercises General Exercises - Lower Extremity Ankle Circles/Pumps: AROM;10 reps Long Arc Quad: Strengthening;10 reps Heel Slides: Strengthening;10 reps Hip ABduction/ADduction: Strengthening;10 reps Hip Flexion/Marching: Strengthening;10 reps    General Comments        Pertinent Vitals/Pain Pain Assessment: No/denies pain    Home Living  Prior Function            PT Goals (current goals can now be found in the care plan section) Progress towards PT goals: Progressing toward goals    Frequency    Min 2X/week      PT Plan Current plan remains appropriate    Co-evaluation              AM-PAC PT "6 Clicks" Daily Activity  Outcome Measure  Difficulty turning over in bed (including adjusting bedclothes, sheets and blankets)?: None Difficulty moving from lying on back to sitting on the side of the bed? : None Difficulty sitting down on and standing up from a chair with arms (e.g., wheelchair, bedside commode, etc,.)?: None Help needed moving to and from a bed to chair (including a wheelchair)?: None Help needed walking in hospital room?:  None Help needed climbing 3-5 steps with a railing? : A Little 6 Click Score: 23    End of Session Equipment Utilized During Treatment: Gait belt;Oxygen (on room air on arrival placed on 2 liters post amb (per nsg)) Activity Tolerance: Patient limited by fatigue Patient left: in bed;with bed alarm set;with call bell/phone within reach Nurse Communication: Mobility status (O2 drop with activity) PT Visit Diagnosis: Muscle weakness (generalized) (M62.81);Difficulty in walking, not elsewhere classified (R26.2)     Time: 4193-7902 PT Time Calculation (min) (ACUTE ONLY): 25 min  Charges:  $Gait Training: 8-22 mins $Therapeutic Exercise: 8-22 mins                    G Codes:       Kreg Shropshire, DPT 06/21/2017, 4:34 PM

## 2017-06-21 NOTE — Clinical Social Work Note (Signed)
Clinical Social Work Assessment  Patient Details  Name: Lindsey Hobbs MRN: [id number] Date of Birth: 02-Mar-1927  Date of referral:  06/20/17               Reason for consult:  Facility Placement                Permission sought to share information with:  Family Supports, Customer service manager Permission granted to share information::  Yes, Verbal Permission Granted  Name::     Lindsey, Hobbs [phone]   Agency::  SNF admissions  Relationship::     Contact Information:     Housing/Transportation Living arrangements for the past 2 months:  [place] of Information:  Patient Patient Interpreter Needed:  None Criminal Activity/Legal Involvement Pertinent to Current Situation/Hospitalization:  No - Comment as needed Significant Relationships:  Adult Children Lives with:  Self Do you feel safe going back to the place where you live?  No Need for family participation in patient care:  Yes (Comment)  Care giving concerns:  Patient and family feel she needs short term rehab before she is able to return back home.   Social Worker assessment / plan:  Patient is an 83 year old female who is alert and oriented x4.  Patient lives alone, however her son is involved in helping with decision making for patient.  Patient states she has been at SNF in the past and would like to go to [facility] or [facility] if possible.  Patient was explained role of CSW and process for looking for SNF placement.  Patient was also informed on how insurance will pay for stay at SNF and what to expect while receiving therapy.  Patient requested that [place] update patient's son as well, CSW contacted her son and gave him update on PT recommendation for SNF.  Patient and her son gave CSW permission to begin bed search in [place].  Patient and family did not express any other questions or concerns.   Employment status:  Retired Forensic scientist:  Information systems manager, Doctor, hospital PT Recommendations:  [place] / Referral to community resources:  [place]  Patient/Family's Response to care:  Patient and family are agreeable to going to SNF for short term rehab.  Patient/Family's Understanding of and Emotional Response to Diagnosis, Current Treatment, and Prognosis:  Patient expressed that she is hopeful that she will not have to be at [facility] for very long.  Emotional Assessment Appearance:  Appears stated age Attitude/Demeanor/Rapport:    Affect (typically observed):  Appropriate, Calm, Stable Orientation:  Oriented to Self, Oriented to Place, Oriented to  Time, Oriented to Situation Alcohol / Substance use:  Not Applicable Psych involvement (Current and /or in the community):  No (Comment)  Discharge Needs  Concerns to be addressed:  Lack of Support Readmission within the last 30 days:  No Current discharge risk:  Lives alone Barriers to Discharge:  Continued Medical Work up   Lindsey Hobbs 06/21/2017, 5:25 PM

## 2017-06-22 DIAGNOSIS — I5023 Acute on chronic systolic (congestive) heart failure: Secondary | ICD-10-CM

## 2017-06-22 DIAGNOSIS — Z716 Tobacco abuse counseling: Secondary | ICD-10-CM

## 2017-06-22 LAB — BASIC METABOLIC PANEL
ANION GAP: 3 — AB (ref 5–15)
BUN: 14 mg/dL (ref 6–20)
CALCIUM: 8.7 mg/dL — AB (ref 8.9–10.3)
CHLORIDE: 103 mmol/L (ref 101–111)
CO2: 39 mmol/L — AB (ref 22–32)
Creatinine, Ser: 0.99 mg/dL (ref 0.44–1.00)
GFR calc non Af Amer: 50 mL/min — ABNORMAL LOW (ref >60)
GFR, EST AFRICAN AMERICAN: 58 mL/min — AB (ref >60)
GLUCOSE: 90 mg/dL (ref 65–99)
Potassium: 4.6 mmol/L (ref 3.5–5.1)
Sodium: 145 mmol/L (ref 135–145)

## 2017-06-22 LAB — CBC
HEMATOCRIT: 35 % (ref 35.0–47.0)
HEMOGLOBIN: 11.4 g/dL — AB (ref 12.0–16.0)
MCH: 31.7 pg (ref 26.0–34.0)
MCHC: 32.6 g/dL (ref 32.0–36.0)
MCV: 97.3 fL (ref 80.0–100.0)
Platelets: 272 10*3/uL (ref 150–440)
RBC: 3.6 MIL/uL — ABNORMAL LOW (ref 3.80–5.20)
RDW: 22.1 % — AB (ref 11.5–14.5)
WBC: 4.1 10*3/uL (ref 3.6–11.0)

## 2017-06-22 LAB — GLUCOSE, CAPILLARY: Glucose-Capillary: 98 mg/dL (ref 65–99)

## 2017-06-22 MED ORDER — CARVEDILOL 3.125 MG PO TABS
3.1250 mg | ORAL_TABLET | Freq: Two times a day (BID) | ORAL | Status: DC
  Administered 2017-06-22 – 2017-06-24 (×4): 3.125 mg via ORAL
  Filled 2017-06-22 (×4): qty 1

## 2017-06-22 MED ORDER — ENALAPRIL MALEATE 2.5 MG PO TABS
2.5000 mg | ORAL_TABLET | Freq: Every day | ORAL | Status: DC
  Administered 2017-06-22 – 2017-06-24 (×3): 2.5 mg via ORAL
  Filled 2017-06-22 (×3): qty 1

## 2017-06-22 MED ORDER — SODIUM CHLORIDE 0.9% FLUSH
3.0000 mL | Freq: Two times a day (BID) | INTRAVENOUS | Status: DC
  Administered 2017-06-22 – 2017-06-24 (×4): 3 mL via INTRAVENOUS

## 2017-06-22 NOTE — Progress Notes (Signed)
[facility] [place] NAME: Lindsey Hobbs    MR#:  [id number]  DATE OF BIRTH:  05/01/1927  SUBJECTIVE: Patient feels better, denies any complaints.   CHIEF COMPLAINT:   Chief Complaint  Patient presents with  . Shortness of Breath  . Leg Swelling  Weak, sodium trending down now 150->148, -145. REVIEW OF SYSTEMS:  CONSTITUTIONAL: No fever,positive for fatigue or weakness.  EYES: No blurred or double vision.  EARS, NOSE, AND THROAT: No tinnitus or ear pain.  RESPIRATORY: No cough,positive for shortness of breath, wheezing or hemoptysis.  CARDIOVASCULAR: No chest pain, orthopnea, edema.  GASTROINTESTINAL: No nausea, vomiting, diarrhea or abdominal pain.  GENITOURINARY: No dysuria, hematuria.  ENDOCRINE: No polyuria, nocturia,  HEMATOLOGY: No anemia, easy bruising or bleeding SKIN: No rash or lesion. MUSCULOSKELETAL: No joint pain or arthritis.   NEUROLOGIC: No tingling, numbness, weakness.  PSYCHIATRY: No anxiety or depression.   ROS  DRUG ALLERGIES:  No Known Allergies VITALS:  Blood pressure (!) 131/54, pulse 70, temperature (!) 97.5 F (36.4 C), temperature source Oral, resp. rate 20, height 5\' 4"  (7.106 m), weight 52.6 kg (115 lb 14.4 oz), SpO2 92 %. PHYSICAL EXAMINATION:  GENERAL:  83 y.o.-year-old patient lying in the bed with no acute distress.  EYES: Pupils equal, round, reactive to light and accommodation. No scleral icterus. Extraocular muscles intact.  HEENT: Head atraumatic, normocephalic. Oropharynx and nasopharynx clear.  NECK:  Supple, no jugular venous distention. No thyroid enlargement, no tenderness.  LUNGS: Normal breath sounds bilaterally, no wheezing, some crepitation. No use of accessory muscles of respiration.  CARDIOVASCULAR: S1, S2 normal. No murmurs, rubs, or gallops.  ABDOMEN: Soft, nontender, nondistended. Bowel sounds present. No organomegaly or mass.  EXTREMITIES: No pedal edema, cyanosis, or clubbing.   NEUROLOGIC: Cranial nerves II through XII are intact. Muscle strength 4/5 in all extremities. Sensation intact. Gait not checked.  PSYCHIATRIC: The patient is alert and oriented x 3.  SKIN: No obvious rash, lesion, or ulcer.   Physical Exam LABORATORY PANEL:   CBC  Recent Labs Lab 06/22/17 0530  WBC 4.1  HGB 11.4*  HCT 35.0  PLT 272   ------------------------------------------------------------------------------------------------------------------  Chemistries   Recent Labs Lab 06/22/17 0530  NA 145  K 4.6  CL 103  CO2 39*  GLUCOSE 90  BUN 14  CREATININE 0.99  CALCIUM 8.7*   ------------------------------------------------------------------------------------------------------------------  Cardiac Enzymes  Recent Labs Lab 06/19/17 0833 06/19/17 1427  TROPONINI 0.23* 0.23*   ------------------------------------------------------------------------------------------------------------------  RADIOLOGY:  Nm Myocar Multi W/spect W/wall Motion / Ef  Result Date: 06/21/2017 Pharmacological myocardial perfusion imaging study with small region of ischemia noted in the inferolateral wall from basal to apical region Ischemia There is also a small region of fixed defect in the basal to apical anteroseptal region Inferolateral wall hypokinesis, EF estimated at 40% No EKG changes concerning for ischemia at peak stress or in recovery. Moderate  risk scan given small region of ischemia, depressed EF. Signed, Esmond Plants, MD, Ph.D [facility]    ASSESSMENT AND PLAN:   Active Problems:   Chronic deep vein thrombosis (DVT) of right lower extremity (HCC)   Dyspnea   Acute renal insufficiency   Demand ischemia (HCC)   Tobacco abuse counseling   Right leg DVT (HCC)   Acute on chronic congestive heart failure (HCC)   COPD exacerbation ([place])   Palliative care by specialist   Goals of care, counseling/discussion   * Hypernatremia -Slowly improving. Sodium 145 today. I  encouraged her to drink a lot of water. Continue IV fluids for today, recheck sodium, if it is trending down and discontinue IV fluids.  * Acute right lower extremity DVT  - unable to use anticoagulants due to recent gastrointestinal bleed, vascular surgery does not recommend IVC filter placement - - VQ scan - low probability for PE. - Vascular surgery recommends treatment with aspirin if and when she is able to use it  * Acute hypoxic respiratory failure - likely due to COPD/CHF exacerbation clinically improving.'  * Acute Systolic CHF - 2-D echo shows LVEF of 35-40% - Continue IV Lasix -Myoview showed small region of ischemia noted in the inferolateral wall from basal to apical region Ischemia Inferolateral wall hypokinesis, EF estimated at 40%,  - Considering her history of GI bleed, She is a not a good candidate for cardiac catheterization' medical management is advised to cardiology  * Elevated troponin - Likely supply demand ischemia -Myoview results as above - Asymptomatic   * Acute renal insufficiency: Close to baseline, monitor   * GI bleed/acute blood loss anemia  - Hemoglobin 8.3->10.6 received 1 unit of blood transfusion on 08/16 - Not a good candidate for long-term anticoagulation  - Considering her cardiac history.  Cardiology recommends keeping hemoglobin above 8.5.    * COPD - Seems stable, no wheezing   * Tobacco abuse counseling, discussed this patient for 4 minutes, nicotine replacement therapy is initiated. The patient is agreeable    Likely discharge to rehab with palliative care to follow.  poor prognosis; Stable for discharge to rehabilitation when arrangements are made. '  All the records are reviewed and case discussed with Care Management/Social Worker. Management plans discussed with the patient,.  Nursing  and they are in agreement.  CODE STATUS:DO NOT RESUSCITATE, palliative care consultation  TOTAL TIME TAKING CARE OF THIS PATIENT: 35 minutes.      POSSIBLE D/C IN 2-3 DAYS, DEPENDING ON CLINICAL CONDITION.   Epifanio Lesches M.D on 06/22/2017   Between 7am to 6pm - Pager - [phone]  After 6pm go to [email or web address] - password [email or web address] [facility]  Office  [phone]  CC: Primary care physician; Cletis Athens, MD  Note: This dictation was prepared with Dragon dictation along with smaller phrase technology. Any transcriptional errors that result from this process are unintentional.

## 2017-06-22 NOTE — Progress Notes (Signed)
New referral for Palliative to follow after discharge at [facility]  received from [employer]. Referral made aware. Thank you Flo Shanks RN, BSN, [facility] Liaison [phone] c

## 2017-06-22 NOTE — Clinical Social Work Note (Signed)
CSW was informed that palliative should follow patient at SNF.  CSW made referral to [facility] nurse liason, CSW to continue to follow patient's progress throughout discharge planning.  Jones Broom. [place], MSW, [place]  06-21-17 9:37 AM

## 2017-06-22 NOTE — Progress Notes (Signed)
Patient Name: Lindsey Hobbs Date of Encounter: 06/22/2017  Primary Cardiologist: New to [facility] - consult by [facility] Problem List     Active Problems:   Chronic deep vein thrombosis (DVT) of right lower extremity (HCC)   Dyspnea   Acute renal insufficiency   Demand ischemia (HCC)   Tobacco abuse counseling   Right leg DVT (HCC)   Acute on chronic congestive heart failure (HCC)   COPD exacerbation ([place])   Palliative care by specialist   Goals of care, counseling/discussion     Subjective   No complaints this morning, Stress test yesterday showing small region of fixed defect, small region of ischemia Results discussed with her in detail She reports reasonable appetite Generalized weakness  Inpatient Medications    Scheduled Meds: . budesonide (PULMICORT) nebulizer solution  0.25 mg Nebulization BID  . docusate sodium  100 mg Oral BID  . ferrous sulfate  325 mg Oral Q breakfast  . folic acid  1 mg Oral Daily  . furosemide  20 mg Intravenous BID  . gabapentin  100 mg Oral Q breakfast  . gabapentin  200 mg Oral QHS  . ipratropium-albuterol  3 mL Nebulization BID  . mouth rinse  15 mL Mouth Rinse BID  . mesalamine  2.4 g Oral Q breakfast  . metoprolol tartrate  12.5 mg Oral BID  . multivitamin with minerals  1 tablet Oral Daily  . nicotine  21 mg Transdermal Daily  . omega-3 acid ethyl esters  1 g Oral Daily  . senna  1 tablet Oral BID   Continuous Infusions:  PRN Meds: acetaminophen **OR** acetaminophen, guaiFENesin, ipratropium-albuterol, loratadine, neomycin-bacitracin-polymyxin, ondansetron **OR** ondansetron (ZOFRAN) IV, pantoprazole, technetium TC 78M diethylenetriame-pentaacetic acid, zolpidem   Vital Signs    Vitals:   06/22/17 0452 06/22/17 0716 06/22/17 0746 06/22/17 1152  BP: (!) 119/54  (!) 131/54 (!) 112/48  Pulse: 71  70 64  Resp: 16  20 16   Temp: 98.6 F (37 C)  (!) 97.5 F (36.4 C) 97.7 F (36.5 C)  TempSrc: Oral  Oral Oral    SpO2: 92% 96% 92% 94%  Weight: 115 lb 14.4 oz (52.6 kg)     Height:        Intake/Output Summary (Last 24 hours) at 06/22/17 1456 Last data filed at 06/22/17 1300  Gross per 24 hour  Intake             1440 ml  Output             1950 ml  Net             -510 ml   Filed Weights   06/20/17 0500 06/21/17 0522 06/22/17 0452  Weight: 120 lb 9.6 oz (54.7 kg) 116 lb 11.2 oz (52.9 kg) 115 lb 14.4 oz (52.6 kg)    Physical Exam     GEN: Frail appearing, in no acute distress.  HEENT: Grossly normal.  Neck: Supple, JVD elevated ~ 12 cm, no carotid bruits, or masses. Cardiac: RRR, II/VI systolic murmur, no rubs, or gallops. No clubbing, cyanosis, trace pre-tibial edema bilaterally.  Radials/DP/PT 2+ and equal bilaterally.  Respiratory:  Diminished breath sounds bilaterally with crackles bilaterally. Faint expiratory wheezing bilaterally.  GI: Soft, nontender, distended, BS + x 4. MS: no deformity or atrophy. Skin: warm and dry, no rash. Neuro:  Strength and sensation are intact. Psych: AAOx3.  Normal affect.  Labs    CBC  Recent Labs  06/21/17 0421  06/22/17 0530  WBC 5.9 4.1  HGB 10.6* 11.4*  HCT 32.3* 35.0  MCV 96.4 97.3  PLT 301 782   Basic Metabolic Panel  Recent Labs  06/21/17 0421 06/22/17 0530  NA 148* 145  K 3.8 4.6  CL 106 103  CO2 37* 39*  GLUCOSE 98 90  BUN 13 14  CREATININE 0.89 0.99  CALCIUM 8.7* 8.7*   Liver Function Tests No results for input(s): AST, ALT, ALKPHOS, BILITOT, PROT, ALBUMIN in the last 72 hours. No results for input(s): LIPASE, AMYLASE in the last 72 hours. Cardiac Enzymes No results for input(s): CKTOTAL, CKMB, CKMBINDEX, TROPONINI in the last 72 hours. BNP Invalid input(s): POCBNP D-Dimer No results for input(s): DDIMER in the last 72 hours. Hemoglobin A1C No results for input(s): HGBA1C in the last 72 hours. Fasting Lipid Panel No results for input(s): CHOL, HDL, LDLCALC, TRIG, CHOLHDL, LDLDIRECT in the last 72  hours. Thyroid Function Tests No results for input(s): TSH, T4TOTAL, T3FREE, THYROIDAB in the last 72 hours.  Invalid input(s): FREET3  Telemetry    NSR with occasional PACs/PVCs- Personally Reviewed  ECG    n/a - Personally Reviewed  Radiology    Dg Chest 2 View  Result Date: 06/18/2017 IMPRESSION: COPD. Cardiomegaly, vascular congestion. Small effusions. Electronically Signed   By: Rolm Baptise M.D.   On: 06/18/2017 14:35   Nm Pulmonary Vent And Perf (v/q Scan)  Result Date: 06/18/2017 IMPRESSION: Extensive matched ventilation and perfusion defects. Study is low probability for pulmonary embolus. Electronically Signed   By: Rolm Baptise M.D.   On: 06/18/2017 19:44   US Venous Img Lower Bilateral  Result Date: 06/18/2017 IMPRESSION: 1. 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 2. No additional deep venous thrombosis in either extremity. These results will be called to the ordering clinician or representative by the Radiologist Assistant, and communication documented in the PACS or zVision Dashboard. Electronically Signed   By: Titus Dubin M.D.   On: 06/18/2017 14:38    Cardiac Studies   TTE 06/18/17: Study Conclusions  - Left ventricle: The cavity size was mildly dilated. Wall   thickness was normal. Systolic function was moderately reduced.   The estimated ejection fraction was in the range of 35% to 40%.   Regional wall motion abnormalities cannot be excluded. - Mitral valve: There was moderate regurgitation. - Left atrium: The atrium was mildly dilated. - Right ventricle: The cavity size was mildly dilated. Wall   thickness was normal. - Right atrium: The atrium was mildly dilated. - Tricuspid valve: There was moderate regurgitation.  Impressions:  - Mild to moderately reduced overall left ventricular function   globally depressed   Ejection fraction between 35-40%   bi atrial enlargement   moderate to severe tricuspid  and mitral regurgitation   moderate to severe thickness of the mitral valve. No cardiac   source of emboli was indentified.  Patient Profile     83 y.o. female with history of Crohn's disease with recent GI bleed in 05/2017, posthemorrhagic anemia requiring blood transfusion, COPD, ongoing tobacco abuse, HTN, and HLD who presented to [facility] with generalized weakness, SOB, PND, orthopnea, and LE swelling. She was found to have acute systolic CHF.   Assessment & Plan    1. Acute respiratory distress with hypoxia: -Likely multifactorial including acute systolic CHF, anemia, and AECOPD Would continue IV Lasix as she is on low-dose IV fluids for high sodium Could change to Lasix 20 minute grams  by mouth twice a day once IV fluids are stopped  2. Acute systolic CHF: Ejection fraction 35-40% Currently on IV Lasix with KCl repletion Would recommend we change to low-dose Coreg 3.125 mg twice a day with enalapril 2.5 consider adding spironolactone as an outpatient  Lexiscan Myoview with small region of ischemia, medical management given recent GI bleeding  3. Elevated troponin: -No chest pain -Not on heparin gtt or ASA given anemia/recent GI bleed Consider checking lipid panel. Currently not on a statin  4. Valvular heart disease: -TTE as above Moderate to severe MR and TR secondary to dilated cardiomyopathy, CHF  5. RLE DVT: -Seen by vascular -No indication for IVC filter at this time -Not a good candidate for anticoagulation given anemia  6. GI bleed/anemia: -Not a good candidate for anticoagulation  -Status post 1 unit pRBC on 8/16 Hematocrit stable  7. AECOPD: -Per IM   Total encounter time more than 25 minutes  Greater than 50% was spent in counseling and coordination of care with the patient   Signed: Esmond Plants M.D., Ph.D. [facility]

## 2017-06-23 LAB — BASIC METABOLIC PANEL WITH GFR
Anion gap: 5 (ref 5–15)
BUN: 16 mg/dL (ref 6–20)
CO2: 38 mmol/L — ABNORMAL HIGH (ref 22–32)
Calcium: 8.8 mg/dL — ABNORMAL LOW (ref 8.9–10.3)
Chloride: 103 mmol/L (ref 101–111)
Creatinine, Ser: 0.93 mg/dL (ref 0.44–1.00)
GFR calc Af Amer: >60 mL/min (ref >60)
GFR calc non Af Amer: 54 mL/min — ABNORMAL LOW (ref >60)
Glucose, Bld: 91 mg/dL (ref 65–99)
Potassium: 4 mmol/L (ref 3.5–5.1)
Sodium: 146 mmol/L — ABNORMAL HIGH (ref 135–145)

## 2017-06-23 LAB — GLUCOSE, CAPILLARY: GLUCOSE-CAPILLARY: 84 mg/dL (ref 65–99)

## 2017-06-23 NOTE — Progress Notes (Signed)
Earlier this morning she had wheezing through out her lungs.  Her 0@ sats on 2L [place] was 88 %.  O2 increased to 3 L.  Following her neb tx her lungs are clear.  Her RR remains at 24.  Her O2 sat on [place] 2L is only 89%. On 3L [place] her 02 sats is 91%.  Discussed with Dr. Vianne Bulls.  Will remain on 3 L

## 2017-06-23 NOTE — Progress Notes (Signed)
Per CCMD pt. Had 9 beat run of v-tach. Pt. Alert and oriented with no SOB or acute distress at this time. Pt. Was sleeping and easily awakenedPrime aware (Dr. Estanislado Pandy) no new orders at this time.

## 2017-06-23 NOTE — Progress Notes (Signed)
[facility] at [place] NAME: Lindsey Hobbs    MR#:  [id number]  DATE OF BIRTH:  Mar 03, 1927  SUBJECTive; the patient is hypoxic this morning with 18% on 2 L and she also was wheezing. After bronchodilator therapy oxygenation improved and she is at 91% on 3 L. She denies any shortness of breath or chest pain or cough..   CHIEF COMPLAINT:   Chief Complaint  Patient presents with  . Shortness of Breath  . Leg Swelling  Weak, sodium trending down now 150->148, -145. REVIEW OF SYSTEMS:  CONSTITUTIONAL: No fever,positive for fatigue or weakness.  EYES: No blurred or double vision.  EARS, NOSE, AND THROAT: No tinnitus or ear pain.  RESPIRATORY: No cough,positive for shortness of breath, wheezing or hemoptysis.  CARDIOVASCULAR: No chest pain, orthopnea, edema.  GASTROINTESTINAL: No nausea, vomiting, diarrhea or abdominal pain.  GENITOURINARY: No dysuria, hematuria.  ENDOCRINE: No polyuria, nocturia,  HEMATOLOGY: No anemia, easy bruising or bleeding SKIN: No rash or lesion. MUSCULOSKELETAL: No joint pain or arthritis.   NEUROLOGIC: No tingling, numbness, weakness.  PSYCHIATRY: No anxiety or depression.   Review of Systems  Constitutional: Negative for chills and fever.  HENT: Negative for hearing loss.   Eyes: Negative for blurred vision, double vision and photophobia.  Respiratory: Negative for cough, hemoptysis and shortness of breath.   Cardiovascular: Negative for palpitations, orthopnea and leg swelling.  Gastrointestinal: Negative for abdominal pain, diarrhea and vomiting.  Genitourinary: Negative for dysuria and urgency.  Musculoskeletal: Negative for myalgias and neck pain.  Skin: Negative for rash.  Neurological: Negative for dizziness, focal weakness, seizures, weakness and headaches.  Psychiatric/Behavioral: Negative for memory loss. The patient does not have insomnia.     DRUG ALLERGIES:  No Known Allergies VITALS:  Blood pressure  (!) 118/42, pulse 76, temperature 97.6 F (36.4 C), temperature source Oral, resp. rate (!) 24, height 5\' 4"  (1.626 m), weight 50.8 kg (112 lb), SpO2 91 %. PHYSICAL EXAMINATION:  GENERAL:  83 y.o.-year-old patient lying in the bed with no acute distress.  EYES: Pupils equal, round, reactive to light and accommodation. No scleral icterus. Extraocular muscles intact.  HEENT: Head atraumatic, normocephalic. Oropharynx and nasopharynx clear.  NECK:  Supple, no jugular venous distention. No thyroid enlargement, no tenderness.  LUNGS: Normal breath sounds bilaterally, no wheezing, some crepitation. No use of accessory muscles of respiration.  CARDIOVASCULAR: S1, S2 normal. No murmurs, rubs, or gallops.  ABDOMEN: Soft, nontender, nondistended. Bowel sounds present. No organomegaly or mass.  EXTREMITIES: No pedal edema, cyanosis, or clubbing.  NEUROLOGIC: Cranial nerves II through XII are intact. Muscle strength 4/5 in all extremities. Sensation intact. Gait not checked.  PSYCHIATRIC: The patient is alert and oriented x 3.  SKIN: No obvious rash, lesion, or ulcer.   Physical Exam LABORATORY PANEL:   CBC  Recent Labs Lab 06/22/17 0530  WBC 4.1  HGB 11.4*  HCT 35.0  PLT 272   ------------------------------------------------------------------------------------------------------------------  Chemistries   Recent Labs Lab 06/23/17 0448  NA 146*  K 4.0  CL 103  CO2 38*  GLUCOSE 91  BUN 16  CREATININE 0.93  CALCIUM 8.8*   ------------------------------------------------------------------------------------------------------------------  Cardiac Enzymes  Recent Labs Lab 06/19/17 0833 06/19/17 1427  TROPONINI 0.23* 0.23*   ------------------------------------------------------------------------------------------------------------------  RADIOLOGY:  Nm Myocar Multi W/spect W/wall Motion / Ef  Result Date: 06/21/2017 Pharmacological myocardial perfusion imaging study with small  region of ischemia noted in the inferolateral wall from basal to apical region Ischemia There  is also a small region of fixed defect in the basal to apical anteroseptal region Inferolateral wall hypokinesis, EF estimated at 40% No EKG changes concerning for ischemia at peak stress or in recovery. Moderate  risk scan given small region of ischemia, depressed EF. Signed, Esmond Plants, MD, Ph.D [facility]    ASSESSMENT AND PLAN:   Active Problems:   Chronic deep vein thrombosis (DVT) of right lower extremity (HCC)   Dyspnea   Acute renal insufficiency   Demand ischemia (HCC)   Tobacco abuse counseling   Right leg DVT (HCC)   Acute on chronic congestive heart failure (HCC)   COPD exacerbation ([place])   Palliative care by specialist   Goals of care, counseling/discussion   * Hypernatremia -Slowly improving. Sodium 145 today. I encouraged her to drink a lot of water. Continue IV fluids for today, recheck sodium, if it is trending down and discontinue IV fluids.  * Acute right lower extremity DVT  - unable to use anticoagulants due to recent gastrointestinal bleed, vascular surgery does not recommend IVC filter placement - - VQ scan - low probability for PE. - Vascular surgery recommends treatment with aspirin if and when she is able to use it  * Acute hypoxic respiratory failure - likely due to COPD/CHF exacerbation clinically improving.'  * Acute Systolic CHF - 2-D echo shows LVEF of 35-40% - Continue IV Lasix -Myoview showed small region of ischemia noted in the inferolateral wall from basal to apical region Ischemia Inferolateral wall hypokinesis, EF estimated at 40%,  - Considering her history of GI bleed, She is a not a good candidate for cardiac catheterization' medical management is advised to cardiology  * Elevated troponin - Likely supply demand ischemia -Myoview results as above - Asymptomatic   * Acute renal insufficiency: Close to baseline, monitor   * GI bleed/acute  blood loss anemia  - Hemoglobin 8.3->10.6 received 1 unit of blood transfusion on 08/16 - Not a good candidate for long-term anticoagulation  - Considering her cardiac history.  Cardiology recommends keeping hemoglobin above 8.5.    * COPD - Seems stable, no wheezing and the new bronchodilator therapy, oxygen 2-3 L keep saturation 89-94%   * Tobacco abuse counseling, discussed this patient for 4 minutes, nicotine replacement therapy is initiated. The patient is agreeable    Likely discharge to rehab with palliative care to follow.  poor prognosis; Stable for discharge to rehabilitation when arrangements are made. '  All the records are reviewed and case discussed with Care Management/Social Worker. Management plans discussed with the patient,.  Nursing  and they are in agreement.  CODE STATUS:DO NOT RESUSCITATE, palliative care consultation  TOTAL TIME TAKING CARE OF THIS PATIENT: 35 minutes.     POSSIBLE D/C IN 2-3 DAYS, DEPENDING ON CLINICAL CONDITION.   Epifanio Lesches M.D on 06/23/2017   Between 7am to 6pm - Pager - [phone]  After 6pm go to [email or web address] - password [email or web address] [place] [facility]  Office  [phone]  CC: Primary care physician; Cletis Athens, MD  Note: This dictation was prepared with Dragon dictation along with smaller phrase technology. Any transcriptional errors that result from this process are unintentional.

## 2017-06-23 NOTE — Progress Notes (Signed)
Left toe wound cleansed with NS, covered\[id number]\ with triple antibiotic cream. Then covered with gauze.  Wound remains clean with pink tissue.

## 2017-06-24 DIAGNOSIS — E870 Hyperosmolality and hypernatremia: Secondary | ICD-10-CM | POA: Diagnosis not present

## 2017-06-24 DIAGNOSIS — J9601 Acute respiratory failure with hypoxia: Secondary | ICD-10-CM | POA: Diagnosis not present

## 2017-06-24 DIAGNOSIS — G610 Guillain-Barre syndrome: Secondary | ICD-10-CM | POA: Diagnosis not present

## 2017-06-24 DIAGNOSIS — I110 Hypertensive heart disease with heart failure: Secondary | ICD-10-CM | POA: Diagnosis not present

## 2017-06-24 DIAGNOSIS — Z720 Tobacco use: Secondary | ICD-10-CM | POA: Diagnosis not present

## 2017-06-24 DIAGNOSIS — M7989 Other specified soft tissue disorders: Secondary | ICD-10-CM | POA: Diagnosis not present

## 2017-06-24 DIAGNOSIS — J441 Chronic obstructive pulmonary disease with (acute) exacerbation: Secondary | ICD-10-CM | POA: Diagnosis not present

## 2017-06-24 DIAGNOSIS — J449 Chronic obstructive pulmonary disease, unspecified: Secondary | ICD-10-CM | POA: Diagnosis not present

## 2017-06-24 DIAGNOSIS — J302 Other seasonal allergic rhinitis: Secondary | ICD-10-CM | POA: Diagnosis not present

## 2017-06-24 DIAGNOSIS — I509 Heart failure, unspecified: Secondary | ICD-10-CM | POA: Diagnosis present

## 2017-06-24 DIAGNOSIS — K219 Gastro-esophageal reflux disease without esophagitis: Secondary | ICD-10-CM | POA: Diagnosis not present

## 2017-06-24 DIAGNOSIS — E8770 Fluid overload, unspecified: Secondary | ICD-10-CM | POA: Diagnosis not present

## 2017-06-24 DIAGNOSIS — K5090 Crohn's disease, unspecified, without complications: Secondary | ICD-10-CM | POA: Diagnosis not present

## 2017-06-24 DIAGNOSIS — Z7982 Long term (current) use of aspirin: Secondary | ICD-10-CM | POA: Diagnosis not present

## 2017-06-24 DIAGNOSIS — R0600 Dyspnea, unspecified: Secondary | ICD-10-CM | POA: Diagnosis not present

## 2017-06-24 DIAGNOSIS — D649 Anemia, unspecified: Secondary | ICD-10-CM | POA: Diagnosis not present

## 2017-06-24 DIAGNOSIS — F17210 Nicotine dependence, cigarettes, uncomplicated: Secondary | ICD-10-CM | POA: Diagnosis not present

## 2017-06-24 DIAGNOSIS — I2510 Atherosclerotic heart disease of native coronary artery without angina pectoris: Secondary | ICD-10-CM | POA: Diagnosis not present

## 2017-06-24 DIAGNOSIS — I5020 Unspecified systolic (congestive) heart failure: Secondary | ICD-10-CM | POA: Diagnosis not present

## 2017-06-24 DIAGNOSIS — Z9889 Other specified postprocedural states: Secondary | ICD-10-CM | POA: Diagnosis not present

## 2017-06-24 DIAGNOSIS — I82541 Chronic embolism and thrombosis of right tibial vein: Secondary | ICD-10-CM | POA: Diagnosis not present

## 2017-06-24 DIAGNOSIS — Z90710 Acquired absence of both cervix and uterus: Secondary | ICD-10-CM | POA: Diagnosis not present

## 2017-06-24 DIAGNOSIS — K50919 Crohn's disease, unspecified, with unspecified complications: Secondary | ICD-10-CM | POA: Diagnosis not present

## 2017-06-24 DIAGNOSIS — Z79899 Other long term (current) drug therapy: Secondary | ICD-10-CM | POA: Diagnosis not present

## 2017-06-24 DIAGNOSIS — I5021 Acute systolic (congestive) heart failure: Secondary | ICD-10-CM | POA: Diagnosis not present

## 2017-06-24 DIAGNOSIS — I82401 Acute embolism and thrombosis of unspecified deep veins of right lower extremity: Secondary | ICD-10-CM | POA: Diagnosis not present

## 2017-06-24 DIAGNOSIS — F17200 Nicotine dependence, unspecified, uncomplicated: Secondary | ICD-10-CM | POA: Diagnosis not present

## 2017-06-24 DIAGNOSIS — E785 Hyperlipidemia, unspecified: Secondary | ICD-10-CM | POA: Diagnosis not present

## 2017-06-24 DIAGNOSIS — M21612 Bunion of left foot: Secondary | ICD-10-CM | POA: Diagnosis not present

## 2017-06-24 DIAGNOSIS — Z7401 Bed confinement status: Secondary | ICD-10-CM | POA: Diagnosis not present

## 2017-06-24 LAB — GLUCOSE, CAPILLARY: GLUCOSE-CAPILLARY: 84 mg/dL (ref 65–99)

## 2017-06-24 MED ORDER — SPIRONOLACTONE 25 MG PO TABS: 25.0000 mg | ORAL_TABLET | Freq: Every day | ORAL | 11 refills | Status: DC

## 2017-06-24 MED ORDER — BUDESONIDE 0.25 MG/2ML IN SUSP: 0.2500 mg | Freq: Two times a day (BID) | RESPIRATORY_TRACT | 12 refills | Status: DC

## 2017-06-24 MED ORDER — ENALAPRIL MALEATE 2.5 MG PO TABS: 2.5000 mg | ORAL_TABLET | Freq: Every day | ORAL | 0 refills | Status: DC

## 2017-06-24 MED ORDER — FUROSEMIDE 20 MG PO TABS: 20.0000 mg | ORAL_TABLET | Freq: Two times a day (BID) | ORAL | 1 refills | Status: DC

## 2017-06-24 MED ORDER — CARVEDILOL 3.125 MG PO TABS: 3.1250 mg | ORAL_TABLET | Freq: Two times a day (BID) | ORAL | 0 refills | Status: DC

## 2017-06-24 NOTE — Plan of Care (Signed)
Problem: Safety: Goal: Ability to remain free from injury will improve Outcome: Progressing Pt will remain injury free while on this unit. Exit alarm is activated and patient is encouraged to call for assistance with activity as needed.

## 2017-06-24 NOTE — Discharge Summary (Signed)
Lindsey Hobbs, is a 83 y.o. female  DOB Nov 05, 1927  MRN [id number].  Admission date:  06/18/2017  Admitting Physician  Theodoro Grist, MD  Discharge Date:  06/24/2017   Primary MD  Cletis Athens, MD  Recommendations for primary care physician for things to follow:  Follow-up with PCP in one week Follow-up with Dr. Esmond Plants in 2 weeks regarding heart failure management.   Admission Diagnosis  COPD exacerbation (HCC) [J44.1] Acute respiratory failure with hypoxia (HCC) [J96.01] Acute kidney injury ([place]) [N17.9] Demand ischemia of myocardium (HCC) [I24.8] Acute deep vein thrombosis (DVT) of other specified vein of right lower extremity (HCC) [I82.491] Acute on chronic congestive heart failure, unspecified heart failure type ([place]) [I50.9]   Discharge Diagnosis  COPD exacerbation (HCC) [J44.1] Acute respiratory failure with hypoxia ([place]) [J96.01] Acute kidney injury ([place]) [N17.9] Demand ischemia of myocardium (HCC) [I24.8] Acute deep vein thrombosis (DVT) of other specified vein of right lower extremity (HCC) [I82.491] Acute on chronic congestive heart failure, unspecified heart failure type ([place]) [I50.9]    Active Problems:   Chronic deep vein thrombosis (DVT) of right lower extremity (HCC)   Dyspnea   Acute renal insufficiency   Demand ischemia (HCC)   Tobacco abuse counseling   Right leg DVT (HCC)   Acute on chronic congestive heart failure (HCC)   COPD exacerbation ([place])   Palliative care by specialist   Goals of care, counseling/discussion      Past Medical History:  Diagnosis Date  . Anemia   . Avascular necrosis of bone of hip ([place])   . CHF (congestive heart failure) ([place])   . COPD (chronic obstructive pulmonary disease) ([place])   . Crohn's disease ([place])   . DVT (deep venous thrombosis) ([place]) 06/2017  .  Guillain Barr syndrome ([place])   . Hyperlipidemia   . Hypertension     Past Surgical History:  Procedure Laterality Date  . ABDOMINAL HYSTERECTOMY    . COLONOSCOPY WITH PROPOFOL Left 07/07/2015   Procedure: COLONOSCOPY WITH PROPOFOL;  Surgeon: Hulen Luster, MD;  Location: [facility] ENDOSCOPY;  Service: Endoscopy;  Laterality: Left;  . FLEXIBLE SIGMOIDOSCOPY N/A 06/05/2017   Procedure: FLEXIBLE SIGMOIDOSCOPY;  Surgeon: Jonathon Bellows, MD;  Location: [facility] ENDOSCOPY;  Service: Gastroenterology;  Laterality: N/A;       History of present illness and  Hospital Course:     Kindly see H&P for history of present illness and admission details, please review complete Labs, Consult reports and Test reports for all details in brief  HPI  from the history and physical done on the day of admission 83 year old female patient with history of GI bleeds, posthemorrhagic anemia, history of Crohn's disease with history of sigmoid strictures, mucosal ulcers, COPD, ongoing tobacco abuse, hypertension, hyperlipidemia came in because of shortness of breath for 1-2 weeks. And she was hypoxic with O2 saturations 86% on room air when she came. She also had leg swelling. Patient is admitted for acute congestive heart failure, patient also found to have acute right leg DVT.   Hospital Course  #1/acute right leg DVT: Unable to use anticoagulants because of recent GI bleed, vascular surgery has seen the patient. VQ scan showed low probability for PE. Patient ultrasound of the right leg showed posterior tibial vein DVT. So vascular surgery recommended treatment only with aspirin but no IVC filter because of the location of the DVT. Patient is on aspirin alone.  2.acute systolic heart failure: Received IV Lasix and potassium supplements. Seen by cardiology unfortunately she  is not a good candidate for cardiac catheter because of history of GI bleed, anemia requiring transfusions and advanced age. Patient had Myoview stress test. Myoview  stress test showed EF 40% and small region of ischemia in inferolateral regions and cardiology recommended medical management due to her GI bleed, frail state. Patient right now is on Lasix, Coreg, I will add spironolactone also. Patient can follow with cardiology as an outpatient in 1-2 weeks. BNP on admission is 2670.    #3 .hypernatremia due to dehydration: Patient received a D5 containing IV fluids, sodium improved from 150-145. We discontinued IV fluids.  #4 .acute respiratory failure with hypoxia secondary to COPD, CHF exacerbation: Improved, patient right now on 2 L of oxygen, saturation 99%.  #5 GI bleed and acute blood loss anemia: Received in one unit of blood transfusion because of symptomatic anemia. Hemoglobin improved from 8.3-10.6. Not a candidate for anti-anticoagulation because of GI bleed, hip the hemoglobin more than 8.5 due to her cardiac history. #6, acute kidney injury: Improved. Since creatinine improved from 1.13 to  0.09. #7. COPD exacerbation: Improved, no wheezing today. Continue Pulmicort nebs BID for next 3-4 days and then stop.use albuterol nebs every 4 hours as needed for wheezing. #8 elevated troponin secondary to demand ischemia. Discharge Condition: stable   Follow UP   Contact information for follow-up providers    Dew, Erskine Squibb, MD Follow up in 2 week(s).   Specialties:  Vascular Surgery, Radiology, Interventional Cardiology Why:  with RLE DVT study Contact information: [place] [address] [phone]        [place] Follow up on 07/04/2017.   Specialty:  Cardiology Why:  at 10:00am Contact information: [place] [phone]           Contact information for after-discharge care    [facility] Follow up.   Specialty:  [place] information: [place] [phone]                    Discharge Instructions  and  Discharge Medications      Allergies as of 06/24/2017   No Known Allergies     Medication List    STOP taking these medications   predniSONE 10 MG tablet Commonly known as:  DELTASONE     TAKE these medications   budesonide 0.25 MG/2ML nebulizer solution Commonly known as:  PULMICORT Take 2 mLs (0.25 mg total) by nebulization 2 (two) times daily.   CALCIUM 600+D 600-400 MG-UNIT tablet Generic drug:  Calcium Carbonate-Vitamin D Take 1 tablet by mouth 2 (two) times daily.   carvedilol 3.125 MG tablet Commonly known as:  COREG Take 1 tablet (3.125 mg total) by mouth 2 (two) times daily with a meal.   enalapril 2.5 MG tablet Commonly known as:  VASOTEC Take 1 tablet (2.5 mg total) by mouth daily.   ferrous sulfate 325 (65 FE) MG tablet Take 1 tablet (325 mg total) by mouth 2 (two) times daily with a meal. What changed:  when to take this   fexofenadine 180 MG tablet Commonly known as:  ALLEGRA Take 180 mg by mouth at bedtime.   FISH OIL PO Take 1 capsule by mouth 3 (three) times daily.   folic acid 1 MG tablet Commonly known as:  FOLVITE Take 1 mg by mouth daily.  furosemide 20 MG tablet Commonly known as:  LASIX Take 1 tablet (20 mg total) by mouth 2 (two) times daily.   gabapentin 100 MG capsule Commonly known as:  NEURONTIN Take 100-300 mg by mouth See admin instructions. One in the morning and 2 at night   guaiFENesin 600 MG 12 hr tablet Commonly known as:  MUCINEX Take 1 tablet (600 mg total) by mouth 2 (two) times daily.   ipratropium-albuterol 0.5-2.5 (3) MG/3ML Soln Commonly known as:  DUONEB Take 3 mLs by nebulization every 6 (six) hours as needed.   loratadine 10 MG tablet Commonly known as:  CLARITIN Take 10 mg by mouth daily as needed for allergies.   mesalamine 1.2 g EC tablet Commonly known as:  LIALDA Take 2.4 g by mouth  daily with breakfast.   multivitamin with minerals Tabs tablet Take 1 tablet by mouth daily.   pantoprazole 40 MG tablet Commonly known as:  PROTONIX Take 40 mg by mouth daily.   spironolactone 25 MG tablet Commonly known as:  ALDACTONE Take 1 tablet (25 mg total) by mouth daily.   zolpidem 10 MG tablet Commonly known as:  AMBIEN Take 10 mg by mouth at bedtime as needed for sleep.         Diet and Activity recommendation: See Discharge Instructions above   Consults obtained - Cardiology, palliative care, hospice, vascular, physical therapy   Major procedures and Radiology Reports - PLEASE review detailed and final reports for all details, in brief -     Dg Chest 2 View  Result Date: 06/18/2017 CLINICAL DATA:  Shortness of Breath EXAM: CHEST  2 VIEW COMPARISON:  06/05/2017 FINDINGS: There is hyperinflation of the lungs compatible with COPD. Mild cardiomegaly and vascular congestion. No overt edema. Small bilateral effusions. No acute bony abnormality. Severe compression fracture in the midthoracic spine, stable. IMPRESSION: COPD. Cardiomegaly, vascular congestion. Small effusions. Electronically Signed   By: Rolm Baptise M.D.   On: 06/18/2017 14:35   Dg Chest 2 View  Result Date: 06/05/2017 CLINICAL DATA:  Cough. History of COPD and hypertension. Current smoker. EXAM: CHEST  2 VIEW COMPARISON:  PA and lateral chest x-ray of June 01, 2017 FINDINGS: The lungs are mildly hyperinflated. There is patchy increased density in the lingula. There are new small bilateral pleural effusions layering posteriorly. The cardiac silhouette is mildly enlarged. The pulmonary vascularity is not clearly engorged. There is calcification in the wall of the mitral valvular annulus and in the aortic arch. There is chronic wedge compression of T9. IMPRESSION: COPD. New small bilateral pleural effusions layering posteriorly. No discrete pneumonia is demonstrated but patchy lingular atelectasis or early  infiltrate is suspected. Followup PA and lateral chest X-ray is recommended in 3-4 weeks following trial of antibiotic therapy to ensure resolution and exclude underlying malignancy. Cardiomegaly without pulmonary vascular congestion. Thoracic aortic atherosclerosis. Electronically Signed   By: David  Martinique M.D.   On: 06/05/2017 14:41   Dg Chest 2 View  Result Date: 06/01/2017 CLINICAL DATA:  Low O2 sats EXAM: CHEST  2 VIEW COMPARISON:  02/28/2011 FINDINGS: There is hyperinflation of the lungs compatible with COPD. Atelectasis in the left mid lung. No confluent opacity on the right. No effusions. Heart is borderline in size. IMPRESSION: COPD.  Left perihilar atelectasis.  Borderline heart size. Electronically Signed   By: Rolm Baptise M.D.   On: 06/01/2017 12:36   Nm Myocar Multi W/spect W/wall Motion / Ef  Result Date: 06/21/2017 Pharmacological myocardial perfusion imaging study with  small region of ischemia noted in the inferolateral wall from basal to apical region Ischemia There is also a small region of fixed defect in the basal to apical anteroseptal region Inferolateral wall hypokinesis, EF estimated at 40% No EKG changes concerning for ischemia at peak stress or in recovery. Moderate  risk scan given small region of ischemia, depressed EF. Signed, Esmond Plants, MD, Ph.D [facility]   Nm Pulmonary Vent And Perf (v/q Scan)  Result Date: 06/18/2017 CLINICAL DATA:  Shortness of breath. EXAM: NUCLEAR MEDICINE VENTILATION - PERFUSION LUNG SCAN TECHNIQUE: Ventilation images were obtained in multiple projections using inhaled aerosol Tc-73m DTPA. Perfusion images were obtained in multiple projections after intravenous injection of Tc-15m MAA. RADIOPHARMACEUTICALS:  32.2 mCi Technetium-68m DTPA aerosol inhalation and 4.4 mCi Technetium-5m MAA IV COMPARISON:  Chest x-ray earlier today. FINDINGS: Ventilation: Numerous ventilation defects, most pronounced at the lung bases, left greater than right.  Peripheral defects also noted throughout both lungs. Perfusion: Matched perfusion defects noted in the lung bases and peripherally within both lungs. IMPRESSION: Extensive matched ventilation and perfusion defects. Study is low probability for pulmonary embolus. Electronically Signed   By: Rolm Baptise M.D.   On: 06/18/2017 19:44   US Venous Img Lower Bilateral  Result Date: 06/18/2017 CLINICAL DATA:  Bilateral leg swelling and shortness of breath. EXAM: BILATERAL LOWER EXTREMITY VENOUS DOPPLER ULTRASOUND TECHNIQUE: Gray-scale sonography with graded compression, as well as color Doppler and duplex ultrasound were performed to evaluate the lower extremity deep venous systems from the level of the common femoral vein and including the common femoral, femoral, profunda femoral, popliteal and calf veins including the posterior tibial, peroneal and gastrocnemius veins when visible. The superficial great saphenous vein was also interrogated. Spectral Doppler was utilized to evaluate flow at rest and with distal augmentation maneuvers in the common femoral, femoral and popliteal veins. COMPARISON:  None. FINDINGS: RIGHT LOWER EXTREMITY Common Femoral Vein: No evidence of thrombus. Normal compressibility, respiratory phasicity and response to augmentation. Saphenofemoral Junction: No evidence of thrombus. Normal compressibility and flow on color Doppler imaging. Profunda Femoral Vein: No evidence of thrombus. Normal compressibility and flow on color Doppler imaging. Femoral Vein: No evidence of thrombus. Normal compressibility, respiratory phasicity and response to augmentation. Popliteal Vein: No evidence of thrombus. Normal compressibility, respiratory phasicity and response to augmentation. Calf Veins: Duplicated right posterior tibial vein with occlusive thrombus in one of them. Normal compressibility and flow on color Doppler imaging in the remaining calf veins. Superficial Great Saphenous Vein: No evidence of  thrombus. Normal compressibility and flow on color Doppler imaging. Venous Reflux:  None. Other Findings:  None. LEFT LOWER EXTREMITY Common Femoral Vein: No evidence of thrombus. Normal compressibility, respiratory phasicity and response to augmentation. Saphenofemoral Junction: No evidence of thrombus. Normal compressibility and flow on color Doppler imaging. Profunda Femoral Vein: No evidence of thrombus. Normal compressibility and flow on color Doppler imaging. Femoral Vein: No evidence of thrombus. Normal compressibility, respiratory phasicity and response to augmentation. Popliteal Vein: No evidence of thrombus. Normal compressibility, respiratory phasicity and response to augmentation. Calf Veins: No evidence of thrombus. Normal compressibility and flow on color Doppler imaging. Superficial Great Saphenous Vein: No evidence of thrombus. Normal compressibility and flow on color Doppler imaging. Venous Reflux:  None. Other Findings:  None. IMPRESSION: 1. 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 2. No additional deep venous thrombosis in either extremity. These results will be called to the ordering clinician or representative by the Radiologist Assistant,  and communication documented in the PACS or zVision Dashboard. Electronically Signed   By: Titus Dubin M.D.   On: 06/18/2017 14:38    Micro Results    No results found for this or any previous visit (from the past 240 hour(s)).     Today   Subjective:   Lindsey Hobbs today Stable for discharged to [facility]. Palliative care, hospice to follow patient there.  Objective:   Blood pressure (!) 111/40, pulse 70, temperature 97.8 F (36.6 C), resp. rate 19, height 5\' 4"  (1.626 m), weight 50.3 kg (111 lb), SpO2 99 %.   Intake/Output Summary (Last 24 hours) at 06/24/17 1149 Last data filed at 06/24/17 1001  Gross per 24 hour  Intake              360 ml  Output              850 ml  Net              -490 ml    Exam Awake Alert, Oriented x 3, No new F.N deficits, Normal affect [place].AT,PERRAL Supple Neck,No JVD, No cervical lymphadenopathy appriciated.  Symmetrical Chest wall movement, Good air movement bilaterally, CTAB RRR,No Gallops,Rubs or new Murmurs, No Parasternal Heave +ve B.Sounds, Abd Soft, Non tender, No organomegaly appriciated, No rebound -guarding or rigidity. Trace pedal edema.  Data Review   CBC w Diff:  Lab Results  Component Value Date   WBC 4.1 06/22/2017   HGB 11.4 (L) 06/22/2017   HGB 9.9 (L) 03/03/2013   HCT 35.0 06/22/2017   HCT 32.7 (L) 02/27/2013   PLT 272 06/22/2017   PLT 388 02/27/2013   LYMPHOPCT 14 06/01/2017   LYMPHOPCT 16.7 02/27/2013   MONOPCT 8 06/01/2017   MONOPCT 10.2 02/27/2013   EOSPCT 1 06/01/2017   EOSPCT 2.8 02/27/2013   BASOPCT 0 06/01/2017   BASOPCT 0.7 02/27/2013    CMP:  Lab Results  Component Value Date   NA 146 (H) 06/23/2017   NA 143 03/02/2013   K 4.0 06/23/2017   K 3.7 03/02/2013   CL 103 06/23/2017   CL 109 (H) 03/02/2013   CO2 38 (H) 06/23/2017   CO2 25 03/02/2013   BUN 16 06/23/2017   BUN 22 (H) 03/02/2013   CREATININE 0.93 06/23/2017   CREATININE 1.05 03/02/2013   PROT 6.5 06/01/2017   PROT 6.3 (L) 02/27/2013   ALBUMIN 3.3 (L) 06/01/2017   ALBUMIN 2.6 (L) 02/27/2013   BILITOT 0.3 06/01/2017   BILITOT 0.1 (L) 02/27/2013   ALKPHOS 49 06/01/2017   ALKPHOS 48 (L) 02/27/2013   AST 18 06/01/2017   AST 19 02/27/2013   ALT 6 (L) 06/01/2017   ALT 8 (L) 02/27/2013  .   Total Time in preparing paper work, data evaluation and todays exam - 79 minutes  Geovany Trudo M.D on 06/24/2017 at 11:49 AM    Note: This dictation was prepared with Dragon dictation along with smaller phrase technology. Any transcriptional errors that result from this process are unintentional.

## 2017-06-24 NOTE — Progress Notes (Signed)
Physical Therapy Treatment Patient Details Name: Lindsey Hobbs MRN: [id number] DOB: Feb 09, 1927 Today's Date: 06/24/2017    History of Present Illness 83 y.o. female with a known history of Recent admission for acute posthemorrhagic anemia due to gastrointestinal bleed, status post colonoscopy 06/05/2017 by Dr. Vicente Males, revealing mucosal ulceration, sigmoid stricture, blood in colon, but not actively bleeding, which was felt to be related to Crohn's disease, COPD, ongoing tobacco abuse, hypertension, hyperlipidemia, who presents to the hospital with complaints of shortness of breath for the past 1-2 weeks. Her oxygen saturation and emergency room was 86% on room air.  She was found to have L DVT, negative for PE, not a condidate for blood thinners or IVC.  Toponins are elevated but stable.    PT Comments    Pt with disconnected demeanor this session refusing all interventions other than transferring to [facility] and back to bed. Pt requires mild increased time and rails to transfer in/out of bed, but no physical assist. Pt refuses socks on feet, rolling walker or more than Min guard assist. Requires set up for toileting and personal hygiene (also refuses hygiene of hands post toileting). Offered further ambulation/ambulation/up in chair; pt states, "No, I just want to lay here".  Continue PT to progress participation, as pt was participating better in prior session; strength and endurance to improve functional mobility.   Follow Up Recommendations  SNF     Equipment Recommendations  Rolling walker with 5" wheels    Recommendations for Other Services       Precautions / Restrictions Precautions Precautions: Fall Restrictions Weight Bearing Restrictions: No    Mobility  Bed Mobility               General bed mobility comments: Modified independence with use of rails and mild increased time  Transfers Overall transfer level: Needs assistance Equipment used: None Transfers: Sit to/from  Stand Sit to Stand: Min guard         General transfer comment: Refuses socks on feet, use of rw. Does not attain full stand, but more in squat position using rails to handrails on BSC to support with min guard provided  Ambulation/Gait Ambulation/Gait assistance: Min guard Ambulation Distance (Feet): 2 Feet Assistive device: None Gait Pattern/deviations: Step-to pattern;Trunk flexed (side stepping)   Gait velocity interpretation: <1.8 ft/sec, indicative of risk for recurrent falls General Gait Details: Several short sidesteps bed to [facility] and later returned   Stairs            Wheelchair Mobility    Modified Rankin (Stroke Patients Only)       Balance Overall balance assessment: Needs assistance Sitting-balance support: Feet supported Sitting balance-Leahy Scale: Good     Standing balance support: Bilateral upper extremity supported Standing balance-Leahy Scale: Fair                              Cognition Arousal/Alertness: Awake/alert Behavior During Therapy: WFL for tasks assessed/performed (disconnected with attempted care) Overall Cognitive Status: Within Functional Limits for tasks assessed                                 General Comments: Pt not willing to participate in anything accept using [facility]      Exercises      General Comments        Pertinent Vitals/Pain Pain Assessment: No/denies pain  Home Living                      Prior Function            PT Goals (current goals can now be found in the care plan section) Progress towards PT goals: Not progressing toward goals - comment    Frequency    Min 2X/week      PT Plan Current plan remains appropriate    Co-evaluation              AM-PAC PT "6 Clicks" Daily Activity  Outcome Measure  Difficulty turning over in bed (including adjusting bedclothes, sheets and blankets)?: None Difficulty moving from lying on back to sitting on the  side of the bed? : A Little Difficulty sitting down on and standing up from a chair with arms (e.g., wheelchair, bedside commode, etc,.)?: A Little Help needed moving to and from a bed to chair (including a wheelchair)?: A Little Help needed walking in hospital room?: A Little Help needed climbing 3-5 steps with a railing? : A Lot 6 Click Score: 18    End of Session   Activity Tolerance: Other (comment) (self limiting) Patient left: in bed;with call bell/phone within reach;with bed alarm set   PT Visit Diagnosis: Muscle weakness (generalized) (M62.81);Difficulty in walking, not elsewhere classified (R26.2)     Time: 6759-1638 PT Time Calculation (min) (ACUTE ONLY): 16 min  Charges:  $Therapeutic Activity: 8-22 mins                    G CodesLarae Grooms, PTA 06/24/2017, 12:46 PM

## 2017-06-24 NOTE — Clinical Social Work Note (Signed)
Patient to be d/c'ed today to [facility]. Patient and family agreeable to plans will transport via ems RN to call report [phone].  Evette Cristal, MSW, [place]

## 2017-06-24 NOTE — Consult Note (Signed)
Patient lying in bed watching TV.  Patient for possible discharge to facility today.  Discussed with patient dx of Heart Failure, as patient has active problem of Acute on Chronic CHF.  Reviewed booklet, "Living with Heart Failure" with patient.  Discussed diagnosis of HF, importance of monitoring weight / symptoms everyday as well as when to call the MD.  Reviewed the different HF heart zones with patient, rationale for HF medications and low sodium heart healthy diet.  Patient's EF on echo was 35 - 40%.  Pt. currently on oxygen.  Patient is a current smoker and she informed me prior to admission she smoked 1/2 pack per day.  Patient denies any desire for cigarette at this time.  She did state that she did not know if she would be able to quit completely.  The effects of smoking on one's health were discussed with patient. Also discussed with patient the importance of movement / exercise for one's heart and overall health.   Explained to patient she has a  HF Clinic appointment on 07/04/2017 at 10 a.m.  Patient wanted to know where this clinic is located.  I explained to patient this clinic does not replace her cardiologist, but provides her an additional resource to help manage her HF.  Patient verbalized understanding.   11:15 a.m. - 11:50 a.m.    Roanna Epley, RN, BSN, [facility] Cardiovascular and Pulmonary Nurse Navigator

## 2017-06-24 NOTE — Care Management Important Message (Signed)
Important Message  Patient Details  Name: Lindsey Hobbs MRN: [id number] Date of Birth: April 23, 1927   Medicare Important Message Given:  Yes Signed IM notice given    Katrina Stack, RN 06/24/2017, 10:10 AM

## 2017-06-24 NOTE — Discharge Instructions (Signed)
Heart Failure Clinic appointment on July 04 2017 at 10:00am with Darylene Price, [place]. Please call [phone] to reschedule.

## 2017-07-03 DIAGNOSIS — K219 Gastro-esophageal reflux disease without esophagitis: Secondary | ICD-10-CM | POA: Diagnosis not present

## 2017-07-03 DIAGNOSIS — J449 Chronic obstructive pulmonary disease, unspecified: Secondary | ICD-10-CM | POA: Diagnosis not present

## 2017-07-03 DIAGNOSIS — I509 Heart failure, unspecified: Secondary | ICD-10-CM | POA: Diagnosis not present

## 2017-07-03 DIAGNOSIS — J302 Other seasonal allergic rhinitis: Secondary | ICD-10-CM | POA: Diagnosis not present

## 2017-07-03 DIAGNOSIS — M21612 Bunion of left foot: Secondary | ICD-10-CM | POA: Diagnosis not present

## 2017-07-03 DIAGNOSIS — D649 Anemia, unspecified: Secondary | ICD-10-CM | POA: Diagnosis not present

## 2017-07-04 ENCOUNTER — Encounter: Payer: Self-pay | Admitting: Family

## 2017-07-04 ENCOUNTER — Ambulatory Visit: Payer: No Typology Code available for payment source | Attending: Family | Admitting: Family

## 2017-07-04 DIAGNOSIS — E785 Hyperlipidemia, unspecified: Secondary | ICD-10-CM | POA: Insufficient documentation

## 2017-07-04 DIAGNOSIS — I5022 Chronic systolic (congestive) heart failure: Secondary | ICD-10-CM

## 2017-07-04 DIAGNOSIS — I110 Hypertensive heart disease with heart failure: Secondary | ICD-10-CM | POA: Diagnosis not present

## 2017-07-04 DIAGNOSIS — Z90710 Acquired absence of both cervix and uterus: Secondary | ICD-10-CM | POA: Insufficient documentation

## 2017-07-04 DIAGNOSIS — J449 Chronic obstructive pulmonary disease, unspecified: Secondary | ICD-10-CM | POA: Insufficient documentation

## 2017-07-04 DIAGNOSIS — I10 Essential (primary) hypertension: Secondary | ICD-10-CM | POA: Insufficient documentation

## 2017-07-04 DIAGNOSIS — Z79899 Other long term (current) drug therapy: Secondary | ICD-10-CM | POA: Insufficient documentation

## 2017-07-04 DIAGNOSIS — F17210 Nicotine dependence, cigarettes, uncomplicated: Secondary | ICD-10-CM | POA: Diagnosis not present

## 2017-07-04 DIAGNOSIS — I509 Heart failure, unspecified: Secondary | ICD-10-CM | POA: Diagnosis not present

## 2017-07-04 DIAGNOSIS — K5090 Crohn's disease, unspecified, without complications: Secondary | ICD-10-CM | POA: Insufficient documentation

## 2017-07-04 DIAGNOSIS — Z9889 Other specified postprocedural states: Secondary | ICD-10-CM | POA: Insufficient documentation

## 2017-07-04 DIAGNOSIS — Z720 Tobacco use: Secondary | ICD-10-CM | POA: Insufficient documentation

## 2017-07-04 NOTE — Progress Notes (Signed)
Patient ID: Lindsey Hobbs, female    DOB: 1927-10-02, 83 y.o.   MRN: [id number]  HPI  Ms Haberman is a 83 y/o female with a history of anemia, avascular necrosis of hip bone, COPD, chron's disease, DVT, guillain' barre, hyperlipidemia, HTN, current tobacco use and chronic heart failure.   Echo report on 06/18/17 reviewed and showed an EF of 35-40% along with moderate MR/TR.  Admitted 06/18/17 due to right leg DVT. Vascular & cardiology consult obtained. Unable to use anticoagulants due to recent GI bleed. Initially needed IV diuretics and transitioned to oral diuretics. Elevated troponin thought to be due to demand ischemia. Discharged to [facility] after 6 days. Admitted 06/01/17 due to GI bleed secondary to ulcerative colitis. GI consult done. Did receive one unit of blood. Given IV steroids and then a month of prednisone. Discharged with antibiotics after 5 days.   She presents today for her initial visit with a chief complaint of mild fatigue upon moderate exertion. She describes this as chronic in nature having been present for many years with varying levels of severity. She feels like the fatigue is improving. She denies any shortness of breath, chest pain, edema, palpitations, dizziness or weight gain. Does have chronic numbness in the toes of both feet due to guillain' barre many years ago.   Past Medical History:  Diagnosis Date  . Anemia   . Avascular necrosis of bone of hip ([place])   . CHF (congestive heart failure) ([place])   . COPD (chronic obstructive pulmonary disease) ([place])   . Crohn's disease ([place])   . DVT (deep venous thrombosis) ([place]) 06/2017  . Guillain Barr syndrome ([place])   . Hyperlipidemia   . Hypertension    Past Surgical History:  Procedure Laterality Date  . ABDOMINAL HYSTERECTOMY    . COLONOSCOPY WITH PROPOFOL Left 07/07/2015   Procedure: COLONOSCOPY WITH PROPOFOL;  Surgeon: Hulen Luster, MD;  Location: [facility] ENDOSCOPY;  Service: Endoscopy;  Laterality: Left;  .  FLEXIBLE SIGMOIDOSCOPY N/A 06/05/2017   Procedure: FLEXIBLE SIGMOIDOSCOPY;  Surgeon: Jonathon Bellows, MD;  Location[facility] ENDOSCOPY;  Service: Gastroenterology;  Laterality: N/A;  . TOTAL HIP ARTHROPLASTY Left ~11 yrs ago   Family History  Problem Relation Age of Onset  . Breast cancer Mother    Social History  Substance Use Topics  . Smoking status: Current Every Day Smoker    Packs/day: 0.50  . Smokeless tobacco: Never Used  . Alcohol use No   No Known Allergies Prior to Admission medications   Medication Sig Start Date End Date Taking? Authorizing Provider  budesonide (PULMICORT) 0.25 MG/2ML nebulizer solution Take 2 mLs (0.25 mg total) by nebulization 2 (two) times daily. 06/24/17  Yes Epifanio Lesches, MD  Calcium Carbonate-Vitamin D (CALCIUM 600+D) 600-400 MG-UNIT per tablet Take 1 tablet by mouth 2 (two) times daily.   Yes [provider]  carvedilol (COREG) 3.125 MG tablet Take 1 tablet (3.125 mg total) by mouth 2 (two) times daily with a meal. 06/24/17  Yes Epifanio Lesches, MD  enalapril (VASOTEC) 2.5 MG tablet Take 1 tablet (2.5 mg total) by mouth daily. 06/25/17  Yes Epifanio Lesches, MD  ferrous sulfate 325 (65 FE) MG tablet Take 1 tablet (325 mg total) by mouth 2 (two) times daily with a meal. 07/08/15  Yes Sudini, Alveta Heimlich, MD  fexofenadine (ALLEGRA) 180 MG tablet Take 180 mg by mouth at bedtime.   Yes [provider]  folic acid (FOLVITE) 1 MG tablet Take 1 mg by mouth  daily.   Yes [provider]  furosemide (LASIX) 20 MG tablet Take 1 tablet (20 mg total) by mouth 2 (two) times daily. 06/24/17 06/24/18 Yes Epifanio Lesches, MD  gabapentin (NEURONTIN) 100 MG capsule Take 100-300 mg by mouth See admin instructions. One in the morning and 2 at night   Yes [provider]  guaiFENesin (MUCINEX) 600 MG 12 hr tablet Take 1 tablet (600 mg total) by mouth 2 (two) times daily. Patient taking differently: Take 600 mg by mouth 2 (two) times  daily as needed.  06/06/17  Yes Gladstone Lighter, MD  ipratropium-albuterol (DUONEB) 0.5-2.5 (3) MG/3ML SOLN Take 3 mLs by nebulization every 6 (six) hours as needed. 06/06/17  Yes Gladstone Lighter, MD  loratadine (CLARITIN) 10 MG tablet Take 10 mg by mouth daily as needed for allergies.   Yes [provider]  mesalamine (LIALDA) 1.2 g EC tablet Take 1.2 g by mouth daily with breakfast.    Yes [provider]  Omega-3 Fatty Acids (FISH OIL PO) Take 1 capsule by mouth 3 (three) times daily.   Yes [provider]  pantoprazole (PROTONIX) 40 MG tablet Take 40 mg by mouth daily. 05/13/17  Yes [provider]  spironolactone (ALDACTONE) 25 MG tablet Take 1 tablet (25 mg total) by mouth daily. 06/24/17 06/24/18 Yes Epifanio Lesches, MD  zolpidem (AMBIEN) 10 MG tablet Take 10 mg by mouth at bedtime as needed for sleep.   Yes [provider]    Review of Systems  Constitutional: Positive for fatigue. Negative for appetite change.  HENT: Negative for congestion, postnasal drip and sore throat.   Eyes: Negative.   Respiratory: Negative for cough, chest tightness, shortness of breath and wheezing.   Cardiovascular: Negative for chest pain, palpitations and leg swelling.  Gastrointestinal: Negative for abdominal distention and abdominal pain.  Endocrine: Negative.   Genitourinary: Negative.   Musculoskeletal: Negative for back pain and neck pain.  Skin: Negative.   Allergic/Immunologic: Negative.   Neurological: Positive for numbness (toes of both feet due to guillain' barre). Negative for dizziness and light-headedness.  Hematological: Negative for adenopathy. Does not bruise/bleed easily.  Psychiatric/Behavioral: Positive for sleep disturbance (chronic difficulty sleeping). Negative for dysphoric mood. The patient is not nervous/anxious.    Vitals:   07/04/17 1009  BP: (!) 106/50  Pulse: 66  Resp: 18  SpO2: 95%  Weight: 109 lb 4 oz (49.6 kg)   Height: 5\' 4"  (1.626 m)   Wt Readings from Last 3 Encounters:  07/04/17 109 lb 4 oz (49.6 kg)  06/24/17 111 lb (50.3 kg)  06/01/17 117 lb 12.8 oz (53.4 kg)   Lab Results  Component Value Date   CREATININE 0.93 06/23/2017   CREATININE 0.99 06/22/2017   CREATININE 0.89 06/21/2017   Physical Exam  Constitutional: She is oriented to person, place, and time. She appears well-developed and well-nourished.  HENT:  Head: Normocephalic and atraumatic.  Neck: Normal range of motion. Neck supple. No JVD present.  Cardiovascular: Normal rate and regular rhythm.   Pulmonary/Chest: Effort normal. She has no wheezes. She has no rales.  Abdominal: Soft. She exhibits no distension. There is no tenderness.  Musculoskeletal: She exhibits no edema or tenderness.  Neurological: She is alert and oriented to person, place, and time.  Skin: Skin is warm and dry.  Psychiatric: She has a normal mood and affect. Her behavior is normal. Thought content normal.  Nursing note and vitals reviewed.  Assessment & Plan:  1: Chronic heart failure  with reduced ejection fraction- - NYHA class III - euvolemic today - says that she's being weighed daily @ [facility]; discussed the importance of calling for an overnight weight gain of >2 pounds or a weekly weight gain of >5 pounds - not adding salt to her food; says that she reads food labels "some" at home. Does her own shopping/cooking with assistance. Discussed the importance of closely following a 2000mg  sodium diet - currently getting daily physical therapy @ [facility] - has an appointment scheduled with cardiologist Rockey Situ) 07/30/17 - may not be able to titrate carvedilol due to heart rate  2: HTN- - BP looks good although on the low side - currently taking enalapril but may not be able to switch to entresto due to low BP in an 83 y/o female - sees PCP Char[facility] fundraiser) for this  3: Tobacco use- - currently smoking 1/2 ppd of cigarettes - cessation  discussed for 3 minutes with her  Facility medication list was reviewed.  Return in 1 month or sooner for any questions/problems before then.

## 2017-07-04 NOTE — Patient Instructions (Signed)
Continue weighing daily and call for an overnight weight gain of > 2 pounds or a weekly weight gain of >5 pounds. 

## 2017-07-09 ENCOUNTER — Other Ambulatory Visit (INDEPENDENT_AMBULATORY_CARE_PROVIDER_SITE_OTHER): Payer: Self-pay | Admitting: Vascular Surgery

## 2017-07-09 DIAGNOSIS — Z09 Encounter for follow-up examination after completed treatment for conditions other than malignant neoplasm: Secondary | ICD-10-CM

## 2017-07-09 DIAGNOSIS — Z86718 Personal history of other venous thrombosis and embolism: Principal | ICD-10-CM

## 2017-07-09 DIAGNOSIS — Z720 Tobacco use: Secondary | ICD-10-CM | POA: Diagnosis not present

## 2017-07-09 DIAGNOSIS — J441 Chronic obstructive pulmonary disease with (acute) exacerbation: Secondary | ICD-10-CM | POA: Diagnosis not present

## 2017-07-09 DIAGNOSIS — J449 Chronic obstructive pulmonary disease, unspecified: Secondary | ICD-10-CM | POA: Diagnosis not present

## 2017-07-09 DIAGNOSIS — D649 Anemia, unspecified: Secondary | ICD-10-CM | POA: Diagnosis not present

## 2017-07-09 DIAGNOSIS — I509 Heart failure, unspecified: Secondary | ICD-10-CM | POA: Diagnosis not present

## 2017-07-09 DIAGNOSIS — M21612 Bunion of left foot: Secondary | ICD-10-CM | POA: Diagnosis not present

## 2017-07-10 ENCOUNTER — Other Ambulatory Visit (INDEPENDENT_AMBULATORY_CARE_PROVIDER_SITE_OTHER): Payer: Self-pay | Admitting: Vascular Surgery

## 2017-07-10 ENCOUNTER — Encounter (INDEPENDENT_AMBULATORY_CARE_PROVIDER_SITE_OTHER): Payer: Medicare Other

## 2017-07-10 ENCOUNTER — Telehealth: Payer: Self-pay | Admitting: Podiatry

## 2017-07-10 ENCOUNTER — Other Ambulatory Visit (INDEPENDENT_AMBULATORY_CARE_PROVIDER_SITE_OTHER): Payer: Self-pay | Admitting: Internal Medicine

## 2017-07-10 DIAGNOSIS — I82401 Acute embolism and thrombosis of unspecified deep veins of right lower extremity: Secondary | ICD-10-CM

## 2017-07-10 DIAGNOSIS — M21612 Bunion of left foot: Secondary | ICD-10-CM | POA: Diagnosis not present

## 2017-07-10 DIAGNOSIS — I83025 Varicose veins of left lower extremity with ulcer other part of foot: Secondary | ICD-10-CM | POA: Diagnosis not present

## 2017-07-10 DIAGNOSIS — L97522 Non-pressure chronic ulcer of other part of left foot with fat layer exposed: Secondary | ICD-10-CM | POA: Diagnosis not present

## 2017-07-10 DIAGNOSIS — I739 Peripheral vascular disease, unspecified: Secondary | ICD-10-CM | POA: Diagnosis not present

## 2017-07-10 DIAGNOSIS — I10 Essential (primary) hypertension: Secondary | ICD-10-CM | POA: Diagnosis not present

## 2017-07-10 NOTE — Telephone Encounter (Signed)
Patient's home health nurse Judeen Hammans from [facility] called and said that the patient had been released from [facility] yesterday 07/09/17.  They have changed her wound care from collagen that Dr. Amalia Hailey had ordered to Hydrogel. She wanted to make sure that Dr. Amalia Hailey was aware of this and was okay with this change. Sherry ( nurse) would like for someone to call her back to discuss this change at [phone].

## 2017-07-11 DIAGNOSIS — I10 Essential (primary) hypertension: Secondary | ICD-10-CM | POA: Diagnosis not present

## 2017-07-11 DIAGNOSIS — G518 Other disorders of facial nerve: Secondary | ICD-10-CM | POA: Diagnosis not present

## 2017-07-11 DIAGNOSIS — L97522 Non-pressure chronic ulcer of other part of left foot with fat layer exposed: Secondary | ICD-10-CM | POA: Diagnosis not present

## 2017-07-11 DIAGNOSIS — N182 Chronic kidney disease, stage 2 (mild): Secondary | ICD-10-CM | POA: Diagnosis not present

## 2017-07-11 DIAGNOSIS — M21612 Bunion of left foot: Secondary | ICD-10-CM | POA: Diagnosis not present

## 2017-07-11 DIAGNOSIS — J983 Compensatory emphysema: Secondary | ICD-10-CM | POA: Diagnosis not present

## 2017-07-11 DIAGNOSIS — I509 Heart failure, unspecified: Secondary | ICD-10-CM | POA: Diagnosis not present

## 2017-07-11 DIAGNOSIS — I83025 Varicose veins of left lower extremity with ulcer other part of foot: Secondary | ICD-10-CM | POA: Diagnosis not present

## 2017-07-11 DIAGNOSIS — I739 Peripheral vascular disease, unspecified: Secondary | ICD-10-CM | POA: Diagnosis not present

## 2017-07-12 NOTE — Telephone Encounter (Signed)
Change is okay. Dr. Amalia Hailey

## 2017-07-13 DIAGNOSIS — I739 Peripheral vascular disease, unspecified: Secondary | ICD-10-CM | POA: Diagnosis not present

## 2017-07-13 DIAGNOSIS — L97522 Non-pressure chronic ulcer of other part of left foot with fat layer exposed: Secondary | ICD-10-CM | POA: Diagnosis not present

## 2017-07-13 DIAGNOSIS — M21612 Bunion of left foot: Secondary | ICD-10-CM | POA: Diagnosis not present

## 2017-07-13 DIAGNOSIS — I10 Essential (primary) hypertension: Secondary | ICD-10-CM | POA: Diagnosis not present

## 2017-07-13 DIAGNOSIS — I83025 Varicose veins of left lower extremity with ulcer other part of foot: Secondary | ICD-10-CM | POA: Diagnosis not present

## 2017-07-15 DIAGNOSIS — L97522 Non-pressure chronic ulcer of other part of left foot with fat layer exposed: Secondary | ICD-10-CM | POA: Diagnosis not present

## 2017-07-15 DIAGNOSIS — I10 Essential (primary) hypertension: Secondary | ICD-10-CM | POA: Diagnosis not present

## 2017-07-15 DIAGNOSIS — I739 Peripheral vascular disease, unspecified: Secondary | ICD-10-CM | POA: Diagnosis not present

## 2017-07-15 DIAGNOSIS — M21612 Bunion of left foot: Secondary | ICD-10-CM | POA: Diagnosis not present

## 2017-07-15 DIAGNOSIS — I83025 Varicose veins of left lower extremity with ulcer other part of foot: Secondary | ICD-10-CM | POA: Diagnosis not present

## 2017-07-16 ENCOUNTER — Ambulatory Visit: Payer: Medicare Other | Admitting: Gastroenterology

## 2017-07-17 DIAGNOSIS — I83025 Varicose veins of left lower extremity with ulcer other part of foot: Secondary | ICD-10-CM | POA: Diagnosis not present

## 2017-07-17 DIAGNOSIS — I10 Essential (primary) hypertension: Secondary | ICD-10-CM | POA: Diagnosis not present

## 2017-07-17 DIAGNOSIS — M21612 Bunion of left foot: Secondary | ICD-10-CM | POA: Diagnosis not present

## 2017-07-17 DIAGNOSIS — L97522 Non-pressure chronic ulcer of other part of left foot with fat layer exposed: Secondary | ICD-10-CM | POA: Diagnosis not present

## 2017-07-17 DIAGNOSIS — I739 Peripheral vascular disease, unspecified: Secondary | ICD-10-CM | POA: Diagnosis not present

## 2017-07-18 DIAGNOSIS — I739 Peripheral vascular disease, unspecified: Secondary | ICD-10-CM | POA: Diagnosis not present

## 2017-07-18 DIAGNOSIS — L97522 Non-pressure chronic ulcer of other part of left foot with fat layer exposed: Secondary | ICD-10-CM | POA: Diagnosis not present

## 2017-07-18 DIAGNOSIS — I10 Essential (primary) hypertension: Secondary | ICD-10-CM | POA: Diagnosis not present

## 2017-07-18 DIAGNOSIS — M21612 Bunion of left foot: Secondary | ICD-10-CM | POA: Diagnosis not present

## 2017-07-18 DIAGNOSIS — I83025 Varicose veins of left lower extremity with ulcer other part of foot: Secondary | ICD-10-CM | POA: Diagnosis not present

## 2017-07-19 ENCOUNTER — Ambulatory Visit (INDEPENDENT_AMBULATORY_CARE_PROVIDER_SITE_OTHER): Payer: Medicare Other | Admitting: Podiatry

## 2017-07-19 ENCOUNTER — Encounter: Payer: Self-pay | Admitting: Podiatry

## 2017-07-19 ENCOUNTER — Ambulatory Visit (INDEPENDENT_AMBULATORY_CARE_PROVIDER_SITE_OTHER): Payer: Medicare Other

## 2017-07-19 DIAGNOSIS — M79676 Pain in unspecified toe(s): Secondary | ICD-10-CM

## 2017-07-19 DIAGNOSIS — I83025 Varicose veins of left lower extremity with ulcer other part of foot: Secondary | ICD-10-CM

## 2017-07-19 DIAGNOSIS — L97529 Non-pressure chronic ulcer of other part of left foot with unspecified severity: Secondary | ICD-10-CM

## 2017-07-19 DIAGNOSIS — B351 Tinea unguium: Secondary | ICD-10-CM | POA: Diagnosis not present

## 2017-07-19 DIAGNOSIS — L97522 Non-pressure chronic ulcer of other part of left foot with fat layer exposed: Secondary | ICD-10-CM

## 2017-07-22 ENCOUNTER — Telehealth: Payer: Self-pay | Admitting: Cardiovascular Disease

## 2017-07-22 DIAGNOSIS — I83025 Varicose veins of left lower extremity with ulcer other part of foot: Secondary | ICD-10-CM | POA: Diagnosis not present

## 2017-07-22 DIAGNOSIS — L97522 Non-pressure chronic ulcer of other part of left foot with fat layer exposed: Secondary | ICD-10-CM | POA: Diagnosis not present

## 2017-07-22 DIAGNOSIS — I10 Essential (primary) hypertension: Secondary | ICD-10-CM | POA: Diagnosis not present

## 2017-07-22 DIAGNOSIS — I739 Peripheral vascular disease, unspecified: Secondary | ICD-10-CM | POA: Diagnosis not present

## 2017-07-22 DIAGNOSIS — M21612 Bunion of left foot: Secondary | ICD-10-CM | POA: Diagnosis not present

## 2017-07-22 NOTE — Telephone Encounter (Signed)
PT had to cancel new pt 9/25 appt and rescheduled for 11/12  Would like sooner  Please advise

## 2017-07-22 NOTE — Progress Notes (Signed)
   Subjective:  Patient presents today for follow-up treatment and evaluation of a venous ulcer to the 1st MPJ of the left foot. She states she has not seen a vascular doctor yet, but has an appointment on 07/31/17. She states she was in the hospital for a DVT of the RLE. Pt states the ulcer has become more painful at this time. She also complains of elongated, thickened nails. Pain while ambulating in shoes. Patient is unable to trim their own nails.    Past Medical History:  Diagnosis Date  . Anemia   . Avascular necrosis of bone of hip ([place])   . CHF (congestive heart failure) ([place])   . COPD (chronic obstructive pulmonary disease) ([place])   . Crohn's disease ([place])   . DVT (deep venous thrombosis) ([place]) 06/2017  . Guillain Barr syndrome ([place])   . Hyperlipidemia   . Hypertension      Objective/Physical Exam General: The patient is alert and oriented x3 in no acute distress.  Dermatology:  Wound #1 noted to the dorsal medial aspect of the first MPJ left foot measuring approximately 1.0 x 1.0 x 0.1 cm (LxWxD).   To the noted ulceration(s), there is no eschar. There is a moderate amount of slough, fibrin, and necrotic tissue noted. Granulation tissue and wound base is red. There is a minimal amount of serosanguineous drainage noted. There is no exposed bone muscle-tendon ligament or joint. There is no malodor. Periwound integrity is intact. Skin is warm, dry and supple bilateral lower extremities.   Vascular: Diminished pedal pulses bilaterally. Mild edema noted. Capillary refill within normal limits. Varicosities noted bilateral lower extremities.   Neurological: Epicritic and protective threshold intact bilaterally.   Musculoskeletal Exam: Range of motion within normal limits to all pedal and ankle joints bilateral. Muscle strength 5/5 in all groups bilateral. Clinical evidence of bunion deformity noted bilateral lower extremities.  Radiographic Exam:  Normal osseous mineralization.  Joint spaces preserved. No fracture/dislocation/boney destruction. No sign of osteolytic processes or osteomyelitis.   Assessment: 1. Onychodystrophic nails 1-5 bilateral with hyperkeratosis of nails.  2. Onychomycosis of nail due to dermatophyte bilateral 3. Ulcer first MPJ left foot secondary to venous insufficiency   Plan of Care:  #1 Patient was evaluated. X-rays reviewed. #2 medically necessary excisional debridement including subcutaneous tissue was performed using a tissue nipper and a chisel blade. Excisional debridement of all the necrotic nonviable tissue down to healthy bleeding viable tissue was performed with post-debridement measurements same as pre-. #3 the wound was cleansed and dry sterile dressing applied. #4 has appt with Dr. Fletcher Anon, vascular, on 07/31/17. #5 Mechanical debridement of nails 1-5 bilaterally performed using a nail nipper. Filed with dremel without incident.  #6 Continue hydrogel and Band-Aid daily. #7 return to clinic in 3 weeks.    Edrick Kins, DPM [facility]  Dr. Edrick Kins, [address]                                        [place], [place] [address]                Office [phone]  Fax [phone]

## 2017-07-22 NOTE — Telephone Encounter (Signed)
Patient is very hard of hearing. Spoke with her and confirmed appointment information and let her know that if we had opening before that time we would give her a call. She states that we need to wait and speak with her caregiver. Will discuss tomorrow to see if she needs to see Dr. Fletcher Anon or Dr. Saunders Revel due to her non-healing sore.

## 2017-07-24 DIAGNOSIS — M21612 Bunion of left foot: Secondary | ICD-10-CM | POA: Diagnosis not present

## 2017-07-24 DIAGNOSIS — I83025 Varicose veins of left lower extremity with ulcer other part of foot: Secondary | ICD-10-CM | POA: Diagnosis not present

## 2017-07-24 DIAGNOSIS — L97522 Non-pressure chronic ulcer of other part of left foot with fat layer exposed: Secondary | ICD-10-CM | POA: Diagnosis not present

## 2017-07-24 DIAGNOSIS — I739 Peripheral vascular disease, unspecified: Secondary | ICD-10-CM | POA: Diagnosis not present

## 2017-07-24 DIAGNOSIS — I10 Essential (primary) hypertension: Secondary | ICD-10-CM | POA: Diagnosis not present

## 2017-07-25 DIAGNOSIS — I739 Peripheral vascular disease, unspecified: Secondary | ICD-10-CM | POA: Diagnosis not present

## 2017-07-25 DIAGNOSIS — L97522 Non-pressure chronic ulcer of other part of left foot with fat layer exposed: Secondary | ICD-10-CM | POA: Diagnosis not present

## 2017-07-25 DIAGNOSIS — I10 Essential (primary) hypertension: Secondary | ICD-10-CM | POA: Diagnosis not present

## 2017-07-25 DIAGNOSIS — I83025 Varicose veins of left lower extremity with ulcer other part of foot: Secondary | ICD-10-CM | POA: Diagnosis not present

## 2017-07-25 DIAGNOSIS — M21612 Bunion of left foot: Secondary | ICD-10-CM | POA: Diagnosis not present

## 2017-07-25 NOTE — Telephone Encounter (Signed)
Pt is on wait list to see Dr Arida/Dr End

## 2017-07-25 NOTE — Telephone Encounter (Signed)
Lmov for patient to call back,they are scheduled with Dr Rockey Situ but need Dr Arida/Dr End Will await her call back to reschedule'

## 2017-07-26 ENCOUNTER — Ambulatory Visit (INDEPENDENT_AMBULATORY_CARE_PROVIDER_SITE_OTHER): Payer: Medicare Other | Admitting: Vascular Surgery

## 2017-07-26 DIAGNOSIS — L97522 Non-pressure chronic ulcer of other part of left foot with fat layer exposed: Secondary | ICD-10-CM | POA: Diagnosis not present

## 2017-07-26 DIAGNOSIS — I10 Essential (primary) hypertension: Secondary | ICD-10-CM | POA: Diagnosis not present

## 2017-07-26 DIAGNOSIS — I83025 Varicose veins of left lower extremity with ulcer other part of foot: Secondary | ICD-10-CM | POA: Diagnosis not present

## 2017-07-26 DIAGNOSIS — M21612 Bunion of left foot: Secondary | ICD-10-CM | POA: Diagnosis not present

## 2017-07-26 DIAGNOSIS — I739 Peripheral vascular disease, unspecified: Secondary | ICD-10-CM | POA: Diagnosis not present

## 2017-07-27 DIAGNOSIS — M8708 Idiopathic aseptic necrosis of bone, other site: Secondary | ICD-10-CM | POA: Diagnosis not present

## 2017-07-27 DIAGNOSIS — M21612 Bunion of left foot: Secondary | ICD-10-CM | POA: Diagnosis not present

## 2017-07-27 DIAGNOSIS — G9009 Other idiopathic peripheral autonomic neuropathy: Secondary | ICD-10-CM | POA: Diagnosis not present

## 2017-07-27 DIAGNOSIS — I83025 Varicose veins of left lower extremity with ulcer other part of foot: Secondary | ICD-10-CM | POA: Diagnosis not present

## 2017-07-27 DIAGNOSIS — K50911 Crohn's disease, unspecified, with rectal bleeding: Secondary | ICD-10-CM | POA: Diagnosis not present

## 2017-07-27 DIAGNOSIS — I2510 Atherosclerotic heart disease of native coronary artery without angina pectoris: Secondary | ICD-10-CM | POA: Diagnosis not present

## 2017-07-27 DIAGNOSIS — L97522 Non-pressure chronic ulcer of other part of left foot with fat layer exposed: Secondary | ICD-10-CM | POA: Diagnosis not present

## 2017-07-27 DIAGNOSIS — I248 Other forms of acute ischemic heart disease: Secondary | ICD-10-CM | POA: Diagnosis not present

## 2017-07-27 DIAGNOSIS — F17210 Nicotine dependence, cigarettes, uncomplicated: Secondary | ICD-10-CM | POA: Diagnosis not present

## 2017-07-27 DIAGNOSIS — I110 Hypertensive heart disease with heart failure: Secondary | ICD-10-CM | POA: Diagnosis not present

## 2017-07-27 DIAGNOSIS — I739 Peripheral vascular disease, unspecified: Secondary | ICD-10-CM | POA: Diagnosis not present

## 2017-07-27 DIAGNOSIS — I82541 Chronic embolism and thrombosis of right tibial vein: Secondary | ICD-10-CM | POA: Diagnosis not present

## 2017-07-27 DIAGNOSIS — I5031 Acute diastolic (congestive) heart failure: Secondary | ICD-10-CM | POA: Diagnosis not present

## 2017-07-27 DIAGNOSIS — J449 Chronic obstructive pulmonary disease, unspecified: Secondary | ICD-10-CM | POA: Diagnosis not present

## 2017-07-27 DIAGNOSIS — G610 Guillain-Barre syndrome: Secondary | ICD-10-CM | POA: Diagnosis not present

## 2017-07-27 DIAGNOSIS — Z4800 Encounter for change or removal of nonsurgical wound dressing: Secondary | ICD-10-CM | POA: Diagnosis not present

## 2017-07-29 DIAGNOSIS — I110 Hypertensive heart disease with heart failure: Secondary | ICD-10-CM | POA: Diagnosis not present

## 2017-07-29 DIAGNOSIS — L97522 Non-pressure chronic ulcer of other part of left foot with fat layer exposed: Secondary | ICD-10-CM | POA: Diagnosis not present

## 2017-07-29 DIAGNOSIS — I5031 Acute diastolic (congestive) heart failure: Secondary | ICD-10-CM | POA: Diagnosis not present

## 2017-07-29 DIAGNOSIS — I739 Peripheral vascular disease, unspecified: Secondary | ICD-10-CM | POA: Diagnosis not present

## 2017-07-29 DIAGNOSIS — I2510 Atherosclerotic heart disease of native coronary artery without angina pectoris: Secondary | ICD-10-CM | POA: Diagnosis not present

## 2017-07-29 DIAGNOSIS — I83025 Varicose veins of left lower extremity with ulcer other part of foot: Secondary | ICD-10-CM | POA: Diagnosis not present

## 2017-07-30 ENCOUNTER — Other Ambulatory Visit: Payer: Self-pay

## 2017-07-30 ENCOUNTER — Ambulatory Visit: Payer: Medicare Other | Admitting: Cardiovascular Disease

## 2017-07-30 DIAGNOSIS — L97522 Non-pressure chronic ulcer of other part of left foot with fat layer exposed: Secondary | ICD-10-CM

## 2017-07-30 DIAGNOSIS — L97529 Non-pressure chronic ulcer of other part of left foot with unspecified severity: Principal | ICD-10-CM

## 2017-07-30 DIAGNOSIS — I83025 Varicose veins of left lower extremity with ulcer other part of foot: Secondary | ICD-10-CM

## 2017-07-31 DIAGNOSIS — I2510 Atherosclerotic heart disease of native coronary artery without angina pectoris: Secondary | ICD-10-CM | POA: Diagnosis not present

## 2017-07-31 DIAGNOSIS — G518 Other disorders of facial nerve: Secondary | ICD-10-CM | POA: Diagnosis not present

## 2017-07-31 DIAGNOSIS — I110 Hypertensive heart disease with heart failure: Secondary | ICD-10-CM | POA: Diagnosis not present

## 2017-07-31 DIAGNOSIS — I5031 Acute diastolic (congestive) heart failure: Secondary | ICD-10-CM | POA: Diagnosis not present

## 2017-07-31 DIAGNOSIS — I739 Peripheral vascular disease, unspecified: Secondary | ICD-10-CM | POA: Diagnosis not present

## 2017-07-31 DIAGNOSIS — L97522 Non-pressure chronic ulcer of other part of left foot with fat layer exposed: Secondary | ICD-10-CM | POA: Diagnosis not present

## 2017-07-31 DIAGNOSIS — M549 Dorsalgia, unspecified: Secondary | ICD-10-CM | POA: Diagnosis not present

## 2017-07-31 DIAGNOSIS — Z2821 Immunization not carried out because of patient refusal: Secondary | ICD-10-CM | POA: Diagnosis not present

## 2017-07-31 DIAGNOSIS — I10 Essential (primary) hypertension: Secondary | ICD-10-CM | POA: Diagnosis not present

## 2017-07-31 DIAGNOSIS — I83025 Varicose veins of left lower extremity with ulcer other part of foot: Secondary | ICD-10-CM | POA: Diagnosis not present

## 2017-08-02 ENCOUNTER — Encounter (INDEPENDENT_AMBULATORY_CARE_PROVIDER_SITE_OTHER): Payer: Self-pay

## 2017-08-02 ENCOUNTER — Ambulatory Visit (INDEPENDENT_AMBULATORY_CARE_PROVIDER_SITE_OTHER): Payer: Medicare Other | Admitting: Vascular Surgery

## 2017-08-02 ENCOUNTER — Encounter (INDEPENDENT_AMBULATORY_CARE_PROVIDER_SITE_OTHER): Payer: Self-pay | Admitting: Vascular Surgery

## 2017-08-02 ENCOUNTER — Ambulatory Visit (INDEPENDENT_AMBULATORY_CARE_PROVIDER_SITE_OTHER): Payer: Medicare Other

## 2017-08-02 VITALS — BP 114/56 | HR 80 | Resp 16 | Ht 64.5 in | Wt 110.4 lb

## 2017-08-02 DIAGNOSIS — I7025 Atherosclerosis of native arteries of other extremities with ulceration: Secondary | ICD-10-CM

## 2017-08-02 DIAGNOSIS — I82591 Chronic embolism and thrombosis of other specified deep vein of right lower extremity: Secondary | ICD-10-CM | POA: Diagnosis not present

## 2017-08-02 DIAGNOSIS — I110 Hypertensive heart disease with heart failure: Secondary | ICD-10-CM | POA: Diagnosis not present

## 2017-08-02 DIAGNOSIS — E785 Hyperlipidemia, unspecified: Secondary | ICD-10-CM

## 2017-08-02 DIAGNOSIS — Z720 Tobacco use: Secondary | ICD-10-CM

## 2017-08-02 DIAGNOSIS — I248 Other forms of acute ischemic heart disease: Secondary | ICD-10-CM

## 2017-08-02 DIAGNOSIS — I5031 Acute diastolic (congestive) heart failure: Secondary | ICD-10-CM | POA: Diagnosis not present

## 2017-08-02 DIAGNOSIS — I10 Essential (primary) hypertension: Secondary | ICD-10-CM | POA: Diagnosis not present

## 2017-08-02 DIAGNOSIS — L97522 Non-pressure chronic ulcer of other part of left foot with fat layer exposed: Secondary | ICD-10-CM | POA: Diagnosis not present

## 2017-08-02 DIAGNOSIS — I83025 Varicose veins of left lower extremity with ulcer other part of foot: Secondary | ICD-10-CM | POA: Diagnosis not present

## 2017-08-02 DIAGNOSIS — I2510 Atherosclerotic heart disease of native coronary artery without angina pectoris: Secondary | ICD-10-CM | POA: Diagnosis not present

## 2017-08-02 DIAGNOSIS — I739 Peripheral vascular disease, unspecified: Secondary | ICD-10-CM | POA: Diagnosis not present

## 2017-08-02 NOTE — Progress Notes (Signed)
Patient ID: Lindsey Hobbs, female   DOB: 03-Jul-1927, 83 y.o.   MRN: [id number]  Chief Complaint  Patient presents with  . New Patient (Initial Visit)    vv with ulcer    HPI Lindsey Hobbs is a 83 y.o. female.  I am asked to see the patient by Dr. Clifton James at [facility] for evaluation of vascular disease with a nonhealing wound.  The patient reports an ulcer at the medial first metatarsal head now for several months. Other than some rubbing of her shoe, there was no real inciting event or causative factor that started the symptoms. She denies any previous history of vascular disease or intervention to her knowledge. No fevers or chills. Despite excellent local wound care by her podiatrist, the wounds continue to worsen and have not shown any significant improvement. No right leg symptoms. No ulcers or infection on the right leg. She does not have fevers or chills or signs of a systemic infection. She does have a previous history of DVT in the right leg, but no previous diagnosis of arterial insufficiency. The wound does not have much of an odor. There has been some fibrinous exudate on the wound. She continues to smoke and has no interest in quitting.   Past Medical History:  Diagnosis Date  . Anemia   . Avascular necrosis of bone of hip ([place])   . CHF (congestive heart failure) ([place])   . COPD (chronic obstructive pulmonary disease) ([place])   . Crohn's disease ([place])   . DVT (deep venous thrombosis) ([place]) 06/2017  . Guillain Barr syndrome ([place])   . Hyperlipidemia   . Hypertension     Past Surgical History:  Procedure Laterality Date  . ABDOMINAL HYSTERECTOMY    . COLONOSCOPY WITH PROPOFOL Left 07/07/2015   Procedure: COLONOSCOPY WITH PROPOFOL;  Surgeon: Hulen Luster, MD;  Location: [facility] ENDOSCOPY;  Service: Endoscopy;  Laterality: Left;  . FLEXIBLE SIGMOIDOSCOPY N/A 06/05/2017   Procedure: FLEXIBLE SIGMOIDOSCOPY;  Surgeon: Jonathon Bellows, MD;  Location: [facility] ENDOSCOPY;  Service:  Gastroenterology;  Laterality: N/A;  . TOTAL HIP ARTHROPLASTY Left ~11 yrs ago    Family History  Problem Relation Age of Onset  . Breast cancer Mother   No Bleeding disorders, clotting disorders, or autoimmune diseases  Social History Social History  Substance Use Topics  . Smoking status: Current Every Day Smoker    Packs/day: 0.50  . Smokeless tobacco: Never Used  . Alcohol use No  No IVDU  No Known Allergies  Current Outpatient Prescriptions  Medication Sig Dispense Refill  . budesonide (PULMICORT) 0.25 MG/2ML nebulizer solution Take 2 mLs (0.25 mg total) by nebulization 2 (two) times daily. 60 mL 12  . Calcium Carbonate-Vitamin D (CALCIUM 600+D) 600-400 MG-UNIT per tablet Take 1 tablet by mouth 2 (two) times daily.    . carvedilol (COREG) 3.125 MG tablet Take 1 tablet (3.125 mg total) by mouth 2 (two) times daily with a meal. 60 tablet 0  . enalapril (VASOTEC) 2.5 MG tablet Take 1 tablet (2.5 mg total) by mouth daily. 30 tablet 0  . ferrous sulfate 325 (65 FE) MG tablet Take 1 tablet (325 mg total) by mouth 2 (two) times daily with a meal. 180 tablet 0  . fexofenadine (ALLEGRA) 180 MG tablet Take 180 mg by mouth at bedtime.    . folic acid (FOLVITE) 1 MG tablet Take 1 mg by mouth daily.    . furosemide (LASIX) 20 MG tablet Take 1 tablet (  20 mg total) by mouth 2 (two) times daily. 60 tablet 1  . gabapentin (NEURONTIN) 100 MG capsule Take 100-300 mg by mouth See admin instructions. One in the morning and 2 at night    . guaiFENesin (MUCINEX) 600 MG 12 hr tablet Take 1 tablet (600 mg total) by mouth 2 (two) times daily. (Patient taking differently: Take 600 mg by mouth 2 (two) times daily as needed. ) 20 tablet 0  . ipratropium-albuterol (DUONEB) 0.5-2.5 (3) MG/3ML SOLN Take 3 mLs by nebulization every 6 (six) hours as needed. 360 mL 0  . loratadine (CLARITIN) 10 MG tablet Take 10 mg by mouth daily as needed for allergies.    . mesalamine (LIALDA) 1.2 g EC tablet Take 1.2 g by  mouth daily with breakfast.     . Omega-3 Fatty Acids (FISH OIL PO) Take 1 capsule by mouth 3 (three) times daily.    . pantoprazole (PROTONIX) 40 MG tablet Take 40 mg by mouth daily.    [name] spironolactone (ALDACTONE) 25 MG tablet Take 1 tablet (25 mg total) by mouth daily. 30 tablet 11  . zolpidem (AMBIEN) 10 MG tablet Take 10 mg by mouth at bedtime as needed for sleep.     No current facility-administered medications for this visit.       REVIEW OF SYSTEMS (Negative unless checked)  Constitutional: [] Weight loss  [] Fever  [] Chills Cardiac: [] Chest pain   [] Chest pressure   [] Palpitations   [] Shortness of breath when laying flat   [] Shortness of breath at rest   [] Shortness of breath with exertion. Vascular:  [] Pain in legs with walking   [] Pain in legs at rest   [] Pain in legs when laying flat   [] Claudication   [x] Pain in feet when walking  [x] Pain in feet at rest  [] Pain in feet when laying flat   [] History of DVT   [] Phlebitis   [x] Swelling in legs   [] Varicose veins   [x] Non-healing ulcers Pulmonary:   [] Uses home oxygen   [] Productive cough   [] Hemoptysis   [] Wheeze  [x] COPD   [] Asthma Neurologic:  [] Dizziness  [] Blackouts   [] Seizures   [] History of stroke   [] History of TIA  [] Aphasia   [] Temporary blindness   [] Dysphagia   [] Weakness or numbness in arms   [] Weakness or numbness in legs Musculoskeletal:  [x] Arthritis   [] Joint swelling   [] Joint pain   [] Low back pain Hematologic:  [] Easy bruising  [] Easy bleeding   [] Hypercoagulable state   [] Anemic  [] Hepatitis Gastrointestinal:  [] Blood in stool   [] Vomiting blood  [x] Gastroesophageal reflux/heartburn   [] Abdominal pain Genitourinary:  [] Chronic kidney disease   [] Difficult urination  [] Frequent urination  [] Burning with urination   [] Hematuria Skin:  [] Rashes   [x] Ulcers   [x] Wounds Psychological:  [] History of anxiety   []  History of major depression.    Physical Exam BP (!) 114/56 (BP Location: Right Arm)   Pulse 80   Resp  16   Ht 5' 4.5" (1.638 m)   Wt 50.1 kg (110 lb 6.4 oz)   BMI 18.66 kg/m  Gen:  WD/WN, NAD. Appears younger than stated age Head: [place]/AT, No temporalis wasting. Ear/Nose/Throat: Hearing grossly intact, nares w/o erythema or drainage, oropharynx w/o Erythema/Exudate Eyes: Conjunctiva clear, sclera non-icteric  Neck: trachea midline.  No JVD.  Pulmonary:  Good air movement, respirations not labored, no use of accessory muscles Cardiac: RRR, normal S1, S2 Vascular:  Vessel Right Left  Radial Palpable Palpable  Popliteal 1+ Palpable Not Palpable  PT 1+ Palpable Trace Palpable  DP Not Palpable Not Palpable   Gastrointestinal: soft, non-tender/non-distended.  Musculoskeletal: M/S 5/5 throughout. No deformity or atrophy. Trace lower extremity edema. 2 cm circular ulceration on the medial aspect of the left metatarsal head with poor granulation tissue and mild fibrinous exudate. No significant surrounding erythema. Neurologic: Sensation grossly intact in extremities.  Symmetrical.  Speech is fluent. Motor exam as listed above. Walks with a cane Psychiatric: Judgment and insight seem at least fair. Her caretaker does help her with a history today. Dermatologic: Wound as described above   Radiology Dg Foot Complete Left  Result Date: 07/19/2017 Please see detailed radiograph report in office note.   Labs Recent Results (from the past 2160 hour(s))  Comprehensive metabolic panel     Status: Abnormal   Collection Time: 06/01/17 11:09 AM  Result Value Ref Range   Sodium 138 135 - 145 mmol/L   Potassium 3.8 3.5 - 5.1 mmol/L   Chloride 100 (L) 101 - 111 mmol/L   CO2 32 22 - 32 mmol/L   Glucose, Bld 98 65 - 99 mg/dL   BUN 28 (H) 6 - 20 mg/dL   Creatinine, Ser 1.07 (H) 0.44 - 1.00 mg/dL   Calcium 9.5 8.9 - 10.3 mg/dL   Total Protein 6.5 6.5 - 8.1 g/dL   Albumin 3.3 (L) 3.5 - 5.0 g/dL   AST 18 15 - 41 U/L   ALT 6 (L) 14 - 54 U/L   Alkaline Phosphatase 49 38 - 126  U/L   Total Bilirubin 0.3 0.3 - 1.2 mg/dL   GFR calc non Af Amer 45 (L) >60 mL/min   GFR calc Af Amer 53 (L) >60 mL/min    Comment: (NOTE) The eGFR has been calculated using the CKD EPI equation. This calculation has not been validated in all clinical situations. eGFR's persistently <60 mL/min signify possible Chronic Kidney Disease.    Anion gap 6 5 - 15  Lipase, blood     Status: None   Collection Time: 06/01/17 11:09 AM  Result Value Ref Range   Lipase 42 11 - 51 U/L  CBC with Differential     Status: Abnormal   Collection Time: 06/01/17 11:09 AM  Result Value Ref Range   WBC 6.8 3.6 - 11.0 K/uL   RBC 3.57 (L) 3.80 - 5.20 MIL/uL   Hemoglobin 11.2 (L) 12.0 - 16.0 g/dL   HCT 33.2 (L) 35.0 - 47.0 %   MCV 92.9 80.0 - 100.0 fL   MCH 31.2 26.0 - 34.0 pg   MCHC 33.6 32.0 - 36.0 g/dL   RDW 14.9 (H) 11.5 - 14.5 %   Platelets 309 150 - 440 K/uL   Neutrophils Relative % 77 %   Neutro Abs 5.2 1.4 - 6.5 K/uL   Lymphocytes Relative 14 %   Lymphs Abs 0.9 (L) 1.0 - 3.6 K/uL   Monocytes Relative 8 %   Monocytes Absolute 0.5 0.2 - 0.9 K/uL   Eosinophils Relative 1 %   Eosinophils Absolute 0.1 0 - 0.7 K/uL   Basophils Relative 0 %   Basophils Absolute 0.0 0 - 0.1 K/uL  Type and screen [facility]     Status: None   Collection Time: 06/01/17 11:10 AM  Result Value Ref Range   ABO/RH(D) A NEG    Antibody Screen NEG    Sample Expiration 06/04/2017   Protime-INR     Status: None   Collection  Time: 06/01/17 12:42 PM  Result Value Ref Range   Prothrombin Time 13.1 11.4 - 15.2 seconds   INR 0.99   Urinalysis, Complete w Microscopic     Status: Abnormal   Collection Time: 06/01/17  1:47 PM  Result Value Ref Range   Color, Urine YELLOW (A) YELLOW   APPearance CLEAR (A) CLEAR   Specific Gravity, Urine 1.008 1.005 - 1.030   pH 6.0 5.0 - 8.0   Glucose, UA NEGATIVE NEGATIVE mg/dL   Hgb urine dipstick NEGATIVE NEGATIVE   Bilirubin Urine NEGATIVE NEGATIVE   Ketones, ur  NEGATIVE NEGATIVE mg/dL   Protein, ur NEGATIVE NEGATIVE mg/dL   Nitrite NEGATIVE NEGATIVE   Leukocytes, UA TRACE (A) NEGATIVE   RBC / HPF 0-5 0 - 5 RBC/hpf   WBC, UA 0-5 0 - 5 WBC/hpf   Bacteria, UA NONE SEEN NONE SEEN   Squamous Epithelial / LPF NONE SEEN NONE SEEN  Hemoglobin     Status: Abnormal   Collection Time: 06/01/17  8:31 PM  Result Value Ref Range   Hemoglobin 11.2 (L) 12.0 - 16.0 g/dL  CBC     Status: Abnormal   Collection Time: 06/02/17  3:11 AM  Result Value Ref Range   WBC 5.1 3.6 - 11.0 K/uL   RBC 3.34 (L) 3.80 - 5.20 MIL/uL   Hemoglobin 10.3 (L) 12.0 - 16.0 g/dL   HCT 31.1 (L) 35.0 - 47.0 %   MCV 92.9 80.0 - 100.0 fL   MCH 30.9 26.0 - 34.0 pg   MCHC 33.3 32.0 - 36.0 g/dL   RDW 15.3 (H) 11.5 - 14.5 %   Platelets 279 150 - 440 K/uL  CBC     Status: Abnormal   Collection Time: 06/03/17  3:29 AM  Result Value Ref Range   WBC 6.6 3.6 - 11.0 K/uL   RBC 2.69 (L) 3.80 - 5.20 MIL/uL   Hemoglobin 8.3 (L) 12.0 - 16.0 g/dL    Comment: RESULT REPEATED AND VERIFIED   HCT 25.2 (L) 35.0 - 47.0 %   MCV 93.7 80.0 - 100.0 fL   MCH 30.7 26.0 - 34.0 pg   MCHC 32.8 32.0 - 36.0 g/dL   RDW 15.5 (H) 11.5 - 14.5 %   Platelets 280 150 - 440 K/uL  C-reactive protein     Status: Abnormal   Collection Time: 06/03/17  4:51 AM  Result Value Ref Range   CRP 1.0 (H) <1.0 mg/dL    Comment: Performed at [facility], [address] [place],  [address]  Hemoglobin     Status: Abnormal   Collection Time: 06/03/17 12:30 PM  Result Value Ref Range   Hemoglobin 9.3 (L) 12.0 - 16.0 g/dL  Hemoglobin     Status: Abnormal   Collection Time: 06/03/17  8:28 PM  Result Value Ref Range   Hemoglobin 9.2 (L) 12.0 - 16.0 g/dL  CBC     Status: Abnormal   Collection Time: 06/04/17  4:13 AM  Result Value Ref Range   WBC 5.2 3.6 - 11.0 K/uL   RBC 2.70 (L) 3.80 - 5.20 MIL/uL   Hemoglobin 8.2 (L) 12.0 - 16.0 g/dL   HCT 25.2 (L) 35.0 - 47.0 %   MCV 93.4 80.0 - 100.0 fL   MCH 30.4 26.0 -  34.0 pg   MCHC 32.6 32.0 - 36.0 g/dL   RDW 15.3 (H) 11.5 - 14.5 %   Platelets 288 150 - 440 K/uL  Hemoglobin  Status: Abnormal   Collection Time: 06/04/17 12:08 PM  Result Value Ref Range   Hemoglobin 8.4 (L) 12.0 - 16.0 g/dL  CBC     Status: Abnormal   Collection Time: 06/05/17  4:01 AM  Result Value Ref Range   WBC 6.3 3.6 - 11.0 K/uL   RBC 2.26 (L) 3.80 - 5.20 MIL/uL   Hemoglobin 7.1 (L) 12.0 - 16.0 g/dL   HCT 21.5 (L) 35.0 - 47.0 %   MCV 95.3 80.0 - 100.0 fL   MCH 31.3 26.0 - 34.0 pg   MCHC 32.9 32.0 - 36.0 g/dL   RDW 15.8 (H) 11.5 - 14.5 %   Platelets 290 150 - 440 K/uL  Basic metabolic panel     Status: Abnormal   Collection Time: 06/05/17  4:01 AM  Result Value Ref Range   Sodium 146 (H) 135 - 145 mmol/L   Potassium 3.5 3.5 - 5.1 mmol/L   Chloride 110 101 - 111 mmol/L   CO2 31 22 - 32 mmol/L   Glucose, Bld 134 (H) 65 - 99 mg/dL   BUN 24 (H) 6 - 20 mg/dL   Creatinine, Ser 0.76 0.44 - 1.00 mg/dL   Calcium 8.4 (L) 8.9 - 10.3 mg/dL   GFR calc non Af Amer >60 >60 mL/min   GFR calc Af Amer >60 >60 mL/min    Comment: (NOTE) The eGFR has been calculated using the CKD EPI equation. This calculation has not been validated in all clinical situations. eGFR's persistently <60 mL/min signify possible Chronic Kidney Disease.    Anion gap 5 5 - 15  Type and screen [facility]     Status: None   Collection Time: 06/05/17  7:45 AM  Result Value Ref Range   ABO/RH(D) A NEG    Antibody Screen NEG    Sample Expiration 06/08/2017    Unit Number [id number]    Blood Component Type RED CELLS,LR    Unit division 00    Status of Unit ISSUED,FINAL    Transfusion Status OK TO TRANSFUSE    Crossmatch Result Compatible   Prepare RBC     Status: None   Collection Time: 06/05/17  7:45 AM  Result Value Ref Range   Order Confirmation ORDER PROCESSED BY BLOOD BANK   BPAM RBC     Status: None   Collection Time: 06/05/17  7:45 AM  Result Value Ref Range   ISSUE  DATE / TIME [id number]    Blood Product Unit Number [id number]    PRODUCT CODE [id number]    Unit Type and Rh 0600    Blood Product Expiration Date [id number]   Hemoglobin and hematocrit, blood     Status: Abnormal   Collection Time: 06/05/17  6:19 PM  Result Value Ref Range   Hemoglobin 8.8 (L) 12.0 - 16.0 g/dL   HCT 26.4 (L) 35.0 - 47.0 %  CBC     Status: Abnormal   Collection Time: 06/06/17  5:37 AM  Result Value Ref Range   WBC 5.7 3.6 - 11.0 K/uL   RBC 2.76 (L) 3.80 - 5.20 MIL/uL   Hemoglobin 8.3 (L) 12.0 - 16.0 g/dL   HCT 25.1 (L) 35.0 - 47.0 %   MCV 91.0 80.0 - 100.0 fL   MCH 30.2 26.0 - 34.0 pg   MCHC 33.2 32.0 - 36.0 g/dL   RDW 20.1 (H) 11.5 - 14.5 %   Platelets 260 150 - 440 K/uL  Basic metabolic panel  Status: Abnormal   Collection Time: 06/18/17 11:40 AM  Result Value Ref Range   Sodium 141 135 - 145 mmol/L   Potassium 4.8 3.5 - 5.1 mmol/L    Comment: HEMOLYSIS AT THIS LEVEL MAY AFFECT RESULT   Chloride 108 101 - 111 mmol/L   CO2 27 22 - 32 mmol/L   Glucose, Bld 113 (H) 65 - 99 mg/dL   BUN 10 6 - 20 mg/dL   Creatinine, Ser 1.06 (H) 0.44 - 1.00 mg/dL   Calcium 9.1 8.9 - 10.3 mg/dL   GFR calc non Af Amer 46 (L) >60 mL/min   GFR calc Af Amer 53 (L) >60 mL/min    Comment: (NOTE) The eGFR has been calculated using the CKD EPI equation. This calculation has not been validated in all clinical situations. eGFR's persistently <60 mL/min signify possible Chronic Kidney Disease.    Anion gap 6 5 - 15  CBC     Status: Abnormal   Collection Time: 06/18/17 11:40 AM  Result Value Ref Range   WBC 6.6 3.6 - 11.0 K/uL   RBC 3.07 (L) 3.80 - 5.20 MIL/uL   Hemoglobin 9.8 (L) 12.0 - 16.0 g/dL   HCT 30.0 (L) 35.0 - 47.0 %   MCV 97.7 80.0 - 100.0 fL   MCH 32.0 26.0 - 34.0 pg   MCHC 32.7 32.0 - 36.0 g/dL   RDW 22.5 (H) 11.5 - 14.5 %   Platelets 392 150 - 440 K/uL  Troponin I     Status: Abnormal   Collection Time: 06/18/17 11:40 AM  Result Value Ref Range    Troponin I 0.24 (HH) <0.03 ng/mL    Comment: CRITICAL RESULT CALLED TO, READ BACK BY AND VERIFIED WITH KATE BUMGARNER ON 06/18/17 AT 1219 QSD   Brain natriuretic peptide     Status: Abnormal   Collection Time: 06/18/17 11:44 AM  Result Value Ref Range   B Natriuretic Peptide 2,670.0 (H) 0.0 - 100.0 pg/mL  Troponin I     Status: Abnormal   Collection Time: 06/18/17  5:02 PM  Result Value Ref Range   Troponin I 0.23 (HH) <0.03 ng/mL    Comment: CRITICAL VALUE NOTED. VALUE IS CONSISTENT WITH PREVIOUSLY REPORTED/CALLED VALUE JJB  Protime-INR     Status: None   Collection Time: 06/18/17  5:02 PM  Result Value Ref Range   Prothrombin Time 13.5 11.4 - 15.2 seconds   INR 1.03   TSH     Status: None   Collection Time: 06/18/17  5:02 PM  Result Value Ref Range   TSH 2.047 0.350 - 4.500 uIU/mL    Comment: Performed by a 3rd Generation assay with a functional sensitivity of <=0.01 uIU/mL.  Miscellaneous [facility] test (send-out)     Status: None   Collection Time: 06/18/17  5:02 PM  Result Value Ref Range   [facility] test code [phone]    [facility] test name [id number]    Misc [facility] result COMMENT     Comment: (NOTE) Test Ordered: [id number] Hemoglobin A1c Hemoglobin A1c                 5.4              %        BN     Reference Range: 4.8-5.6  Prediabetes: 5.7 - 6.4         Diabetes: >6.4         Glycemic control for adults with diabetes: <7.0 Performed At: [facility] [place], [place] [id number] Lindon Romp MD VO:[id number]   ECHOCARDIOGRAM COMPLETE     Status: None   Collection Time: 06/18/17  8:03 PM  Result Value Ref Range   Weight 2,032 oz   Height 64 in   BP 129/64 mmHg  Troponin I     Status: Abnormal   Collection Time: 06/18/17  8:24 PM  Result Value Ref Range   Troponin I 0.20 (HH) <0.03 ng/mL    Comment: CRITICAL VALUE NOTED. VALUE IS CONSISTENT WITH PREVIOUSLY REPORTED/CALLED VALUE.MSS  Urinalysis, Complete w  Microscopic     Status: Abnormal   Collection Time: 06/18/17  9:20 PM  Result Value Ref Range   Color, Urine YELLOW (A) YELLOW   APPearance HAZY (A) CLEAR   Specific Gravity, Urine 1.006 1.005 - 1.030   pH 5.0 5.0 - 8.0   Glucose, UA NEGATIVE NEGATIVE mg/dL   Hgb urine dipstick NEGATIVE NEGATIVE   Bilirubin Urine NEGATIVE NEGATIVE   Ketones, ur NEGATIVE NEGATIVE mg/dL   Protein, ur NEGATIVE NEGATIVE mg/dL   Nitrite NEGATIVE NEGATIVE   Leukocytes, UA MODERATE (A) NEGATIVE   RBC / HPF 0-5 0 - 5 RBC/hpf   WBC, UA 6-30 0 - 5 WBC/hpf   Bacteria, UA NONE SEEN NONE SEEN   Squamous Epithelial / LPF NONE SEEN NONE SEEN   Mucus PRESENT    Hyaline Casts, UA PRESENT   Troponin I     Status: Abnormal   Collection Time: 06/19/17  2:23 AM  Result Value Ref Range   Troponin I 0.21 (HH) <0.03 ng/mL    Comment: CRITICAL VALUE NOTED. VALUE IS CONSISTENT WITH PREVIOUSLY REPORTED/CALLED VALUE BY ALV/CAF   Glucose, capillary     Status: None   Collection Time: 06/19/17  8:09 AM  Result Value Ref Range   Glucose-Capillary 99 65 - 99 mg/dL  Troponin I     Status: Abnormal   Collection Time: 06/19/17  8:33 AM  Result Value Ref Range   Troponin I 0.23 (HH) <0.03 ng/mL    Comment: CRITICAL VALUE NOTED. VALUE IS CONSISTENT WITH PREVIOUSLY REPORTED/CALLED VALUE/HKP  Troponin I     Status: Abnormal   Collection Time: 06/19/17  2:27 PM  Result Value Ref Range   Troponin I 0.23 (HH) <0.03 ng/mL    Comment: CRITICAL VALUE NOTED. VALUE IS CONSISTENT WITH PREVIOUSLY REPORTED/CALLED VALUE / [place]  Basic metabolic panel     Status: Abnormal   Collection Time: 06/19/17  2:27 PM  Result Value Ref Range   Sodium 146 (H) 135 - 145 mmol/L   Potassium 3.9 3.5 - 5.1 mmol/L   Chloride 107 101 - 111 mmol/L   CO2 31 22 - 32 mmol/L   Glucose, Bld 125 (H) 65 - 99 mg/dL   BUN 15 6 - 20 mg/dL   Creatinine, Ser 1.13 (H) 0.44 - 1.00 mg/dL   Calcium 8.9 8.9 - 10.3 mg/dL   GFR calc non Af Amer 42 (L) >60 mL/min   GFR  calc Af Amer 49 (L) >60 mL/min    Comment: (NOTE) The eGFR has been calculated using the CKD EPI equation. This calculation has not been validated in all clinical situations. eGFR's persistently <60 mL/min signify possible Chronic Kidney Disease.    Anion gap 8 5 - 15  CBC     Status: Abnormal   Collection Time: 06/19/17  2:27 PM  Result Value Ref Range   WBC 8.6 3.6 - 11.0 K/uL   RBC 2.67 (L) 3.80 - 5.20 MIL/uL   Hemoglobin 8.3 (L) 12.0 - 16.0 g/dL   HCT 26.1 (L) 35.0 - 47.0 %   MCV 97.7 80.0 - 100.0 fL   MCH 31.1 26.0 - 34.0 pg   MCHC 31.9 (L) 32.0 - 36.0 g/dL   RDW 22.7 (H) 11.5 - 14.5 %   Platelets 362 150 - 440 K/uL  Basic metabolic panel     Status: Abnormal   Collection Time: 06/20/17  4:30 AM  Result Value Ref Range   Sodium 150 (H) 135 - 145 mmol/L   Potassium 3.5 3.5 - 5.1 mmol/L   Chloride 110 101 - 111 mmol/L   CO2 34 (H) 22 - 32 mmol/L   Glucose, Bld 97 65 - 99 mg/dL   BUN 16 6 - 20 mg/dL   Creatinine, Ser 1.09 (H) 0.44 - 1.00 mg/dL   Calcium 8.5 (L) 8.9 - 10.3 mg/dL   GFR calc non Af Amer 44 (L) >60 mL/min   GFR calc Af Amer 51 (L) >60 mL/min    Comment: (NOTE) The eGFR has been calculated using the CKD EPI equation. This calculation has not been validated in all clinical situations. eGFR's persistently <60 mL/min signify possible Chronic Kidney Disease.    Anion gap 6 5 - 15  CBC     Status: Abnormal   Collection Time: 06/20/17  4:30 AM  Result Value Ref Range   WBC 6.2 3.6 - 11.0 K/uL   RBC 2.65 (L) 3.80 - 5.20 MIL/uL   Hemoglobin 8.3 (L) 12.0 - 16.0 g/dL   HCT 26.1 (L) 35.0 - 47.0 %   MCV 98.5 80.0 - 100.0 fL   MCH 31.5 26.0 - 34.0 pg   MCHC 31.9 (L) 32.0 - 36.0 g/dL   RDW 22.8 (H) 11.5 - 14.5 %   Platelets 320 150 - 440 K/uL  Glucose, capillary     Status: None   Collection Time: 06/20/17  7:38 AM  Result Value Ref Range   Glucose-Capillary 87 65 - 99 mg/dL  Type and screen [place]     Status: None   Collection Time:  06/20/17 11:34 AM  Result Value Ref Range   ABO/RH(D) A NEG    Antibody Screen NEG    Sample Expiration 06/23/2017    Unit Number [id number]    Blood Component Type RED CELLS,LR    Unit division 00    Status of Unit ISSUED,FINAL    Transfusion Status OK TO TRANSFUSE    Crossmatch Result Compatible   Prepare RBC     Status: None   Collection Time: 06/20/17 11:34 AM  Result Value Ref Range   Order Confirmation ORDER PROCESSED BY BLOOD BANK   BPAM RBC     Status: None   Collection Time: 06/20/17 11:34 AM  Result Value Ref Range   ISSUE DATE / TIME [id number]    Blood Product Unit Number [id number]    PRODUCT CODE [id number]    Unit Type and Rh 0600    Blood Product Expiration Date [id number]   CBC     Status: Abnormal   Collection Time: 06/20/17  8:18 PM  Result Value Ref Range   WBC 7.3 3.6 - 11.0 K/uL   RBC 3.42 (L) 3.80 - 5.20 MIL/uL   Hemoglobin 10.8 (  L) 12.0 - 16.0 g/dL   HCT 33.2 (L) 35.0 - 47.0 %   MCV 97.0 80.0 - 100.0 fL   MCH 31.5 26.0 - 34.0 pg   MCHC 32.5 32.0 - 36.0 g/dL   RDW 23.7 (H) 11.5 - 14.5 %   Platelets 328 150 - 440 K/uL  CBC     Status: Abnormal   Collection Time: 06/21/17  4:21 AM  Result Value Ref Range   WBC 5.9 3.6 - 11.0 K/uL   RBC 3.35 (L) 3.80 - 5.20 MIL/uL   Hemoglobin 10.6 (L) 12.0 - 16.0 g/dL   HCT 32.3 (L) 35.0 - 47.0 %   MCV 96.4 80.0 - 100.0 fL   MCH 31.6 26.0 - 34.0 pg   MCHC 32.8 32.0 - 36.0 g/dL   RDW 23.3 (H) 11.5 - 14.5 %   Platelets 301 150 - 440 K/uL  Basic metabolic panel     Status: Abnormal   Collection Time: 06/21/17  4:21 AM  Result Value Ref Range   Sodium 148 (H) 135 - 145 mmol/L   Potassium 3.8 3.5 - 5.1 mmol/L   Chloride 106 101 - 111 mmol/L   CO2 37 (H) 22 - 32 mmol/L   Glucose, Bld 98 65 - 99 mg/dL   BUN 13 6 - 20 mg/dL   Creatinine, Ser 0.89 0.44 - 1.00 mg/dL   Calcium 8.7 (L) 8.9 - 10.3 mg/dL   GFR calc non Af Amer 57 (L) >60 mL/min   GFR calc Af Amer >60 >60 mL/min    Comment: (NOTE) The eGFR  has been calculated using the CKD EPI equation. This calculation has not been validated in all clinical situations. eGFR's persistently <60 mL/min signify possible Chronic Kidney Disease.    Anion gap 5 5 - 15  Glucose, capillary     Status: Abnormal   Collection Time: 06/21/17  8:19 AM  Result Value Ref Range   Glucose-Capillary 101 (H) 65 - 99 mg/dL  NM Myocar Multi W/Spect W/Wall Motion / EF     Status: None   Collection Time: 06/21/17 12:32 PM  Result Value Ref Range   Rest HR 77 bpm   Rest BP 100/52 mmHg   Percent HR 72 %   Peak HR 97 bpm   Peak BP 96/46 mmHg   SSS 9    SRS 6    SDS 7    TID 1.03    LV sys vol 43 mL   LV dias vol 86 46 - 106 mL  CBC     Status: Abnormal   Collection Time: 06/22/17  5:30 AM  Result Value Ref Range   WBC 4.1 3.6 - 11.0 K/uL   RBC 3.60 (L) 3.80 - 5.20 MIL/uL   Hemoglobin 11.4 (L) 12.0 - 16.0 g/dL   HCT 35.0 35.0 - 47.0 %   MCV 97.3 80.0 - 100.0 fL   MCH 31.7 26.0 - 34.0 pg   MCHC 32.6 32.0 - 36.0 g/dL   RDW 22.1 (H) 11.5 - 14.5 %   Platelets 272 150 - 440 K/uL  Basic metabolic panel     Status: Abnormal   Collection Time: 06/22/17  5:30 AM  Result Value Ref Range   Sodium 145 135 - 145 mmol/L   Potassium 4.6 3.5 - 5.1 mmol/L   Chloride 103 101 - 111 mmol/L   CO2 39 (H) 22 - 32 mmol/L   Glucose, Bld 90 65 - 99 mg/dL   BUN 14 6 -  20 mg/dL   Creatinine, Ser 0.99 0.44 - 1.00 mg/dL   Calcium 8.7 (L) 8.9 - 10.3 mg/dL   GFR calc non Af Amer 50 (L) >60 mL/min   GFR calc Af Amer 58 (L) >60 mL/min    Comment: (NOTE) The eGFR has been calculated using the CKD EPI equation. This calculation has not been validated in all clinical situations. eGFR's persistently <60 mL/min signify possible Chronic Kidney Disease.    Anion gap 3 (L) 5 - 15  Glucose, capillary     Status: None   Collection Time: 06/22/17  7:40 AM  Result Value Ref Range   Glucose-Capillary 98 65 - 99 mg/dL  Basic metabolic panel     Status: Abnormal   Collection Time:  06/23/17  4:48 AM  Result Value Ref Range   Sodium 146 (H) 135 - 145 mmol/L   Potassium 4.0 3.5 - 5.1 mmol/L   Chloride 103 101 - 111 mmol/L   CO2 38 (H) 22 - 32 mmol/L   Glucose, Bld 91 65 - 99 mg/dL   BUN 16 6 - 20 mg/dL   Creatinine, Ser 0.93 0.44 - 1.00 mg/dL   Calcium 8.8 (L) 8.9 - 10.3 mg/dL   GFR calc non Af Amer 54 (L) >60 mL/min   GFR calc Af Amer >60 >60 mL/min    Comment: (NOTE) The eGFR has been calculated using the CKD EPI equation. This calculation has not been validated in all clinical situations. eGFR's persistently <60 mL/min signify possible Chronic Kidney Disease.    Anion gap 5 5 - 15  Glucose, capillary     Status: None   Collection Time: 06/23/17  7:44 AM  Result Value Ref Range   Glucose-Capillary 84 65 - 99 mg/dL  Glucose, capillary     Status: None   Collection Time: 06/24/17  7:16 AM  Result Value Ref Range   Glucose-Capillary 84 65 - 99 mg/dL    Assessment/Plan:  Tobacco use Discussed that this was an underlying cause for vascular disease. She does not seem to have much interest in quitting.  Hyperlipidemia lipid control important in reducing the progression of atherosclerotic disease. Currently on Fish oil. Would likely need to be on a statin if we consider intervention.   Right leg DVT (HCC) Postphlebitic symptoms seem pretty mild  HTN (hypertension) blood pressure control important in reducing the progression of atherosclerotic disease. On appropriate oral medications.   Atherosclerosis of native arteries of the extremities with ulceration ([place]) To evaluate the patient's lower extremity arterial perfusion, noninvasive studies were performed today. With her previous diagnosis of right calf vein DVT, pressures were not taken on the right but her flow was monophasic and poor with a digital index of only 0.19. Her ABI on the left was 0.38 was undetectable digital flow and very poor waveforms consistent with severe arterial insufficiency.    Recommend:  The patient has evidence of severe atherosclerotic changes of both lower extremities associated with ulceration and tissue loss of the foot.  This represents a limb threatening ischemia and places the patient at the risk for limb loss.  Patient should undergo angiography of the lower extremities with the hope for intervention for limb salvage.  The risks and benefits as well as the alternative therapies was discussed in detail with the patient.  All questions were answered.  Patient agrees to proceed with angiography.  The patient will follow up with me in the office after the procedure.  Leotis Pain 08/02/2017, 12:24 PM   This note was created with Dragon medical transcription system.  Any errors from dictation are unintentional.

## 2017-08-02 NOTE — Assessment & Plan Note (Signed)
Postphlebitic symptoms seem pretty mild

## 2017-08-02 NOTE — Assessment & Plan Note (Signed)
Discussed that this was an underlying cause for vascular disease. She does not seem to have much interest in quitting.

## 2017-08-02 NOTE — Assessment & Plan Note (Addendum)
To evaluate the patient's lower extremity arterial perfusion, noninvasive studies were performed today. With her previous diagnosis of right calf vein DVT, pressures were not taken on the right but her flow was monophasic and poor with a digital index of only 0.19. Her ABI on the left was 0.38 was undetectable digital flow and very poor waveforms consistent with severe arterial insufficiency.   Recommend:  The patient has evidence of severe atherosclerotic changes of both lower extremities associated with ulceration and tissue loss of the foot.  This represents a limb threatening ischemia and places the patient at the risk for limb loss.  Patient should undergo angiography of the lower extremities with the hope for intervention for limb salvage.  The risks and benefits as well as the alternative therapies was discussed in detail with the patient.  All questions were answered.  Patient agrees to proceed with angiography.  The patient will follow up with me in the office after the procedure.

## 2017-08-02 NOTE — Patient Instructions (Signed)

## 2017-08-02 NOTE — Assessment & Plan Note (Addendum)
lipid control important in reducing the progression of atherosclerotic disease. Currently on Fish oil. Would likely need to be on a statin if we consider intervention.

## 2017-08-02 NOTE — Assessment & Plan Note (Signed)
blood pressure control important in reducing the progression of atherosclerotic disease. On appropriate oral medications.  

## 2017-08-05 ENCOUNTER — Telehealth: Payer: Self-pay | Admitting: Family

## 2017-08-05 ENCOUNTER — Other Ambulatory Visit (INDEPENDENT_AMBULATORY_CARE_PROVIDER_SITE_OTHER): Payer: Self-pay | Admitting: Vascular Surgery

## 2017-08-05 ENCOUNTER — Ambulatory Visit: Payer: Medicare Other | Admitting: Family

## 2017-08-05 DIAGNOSIS — I110 Hypertensive heart disease with heart failure: Secondary | ICD-10-CM | POA: Diagnosis not present

## 2017-08-05 DIAGNOSIS — I5031 Acute diastolic (congestive) heart failure: Secondary | ICD-10-CM | POA: Diagnosis not present

## 2017-08-05 DIAGNOSIS — L97522 Non-pressure chronic ulcer of other part of left foot with fat layer exposed: Secondary | ICD-10-CM | POA: Diagnosis not present

## 2017-08-05 DIAGNOSIS — I83025 Varicose veins of left lower extremity with ulcer other part of foot: Secondary | ICD-10-CM | POA: Diagnosis not present

## 2017-08-05 DIAGNOSIS — I739 Peripheral vascular disease, unspecified: Secondary | ICD-10-CM | POA: Diagnosis not present

## 2017-08-05 DIAGNOSIS — I2510 Atherosclerotic heart disease of native coronary artery without angina pectoris: Secondary | ICD-10-CM | POA: Diagnosis not present

## 2017-08-05 NOTE — Telephone Encounter (Signed)
Patient did not show for her [facility] Failure [facility] appointment on 08/05/17. Will attempt to reschedule.

## 2017-08-06 ENCOUNTER — Encounter
Admission: RE | Admit: 2017-08-06 | Discharge: 2017-08-06 | Disposition: A | Discharge status: Home / Self Care | Payer: Medicare Other | Source: Ambulatory Visit | Attending: Vascular Surgery | Admitting: Vascular Surgery

## 2017-08-06 DIAGNOSIS — I7025 Atherosclerosis of native arteries of other extremities with ulceration: Secondary | ICD-10-CM | POA: Diagnosis not present

## 2017-08-06 DIAGNOSIS — M87059 Idiopathic aseptic necrosis of unspecified femur: Secondary | ICD-10-CM | POA: Diagnosis not present

## 2017-08-06 DIAGNOSIS — Z96642 Presence of left artificial hip joint: Secondary | ICD-10-CM | POA: Diagnosis not present

## 2017-08-06 DIAGNOSIS — Z90710 Acquired absence of both cervix and uterus: Secondary | ICD-10-CM | POA: Diagnosis not present

## 2017-08-06 DIAGNOSIS — I509 Heart failure, unspecified: Secondary | ICD-10-CM | POA: Diagnosis not present

## 2017-08-06 DIAGNOSIS — Z9889 Other specified postprocedural states: Secondary | ICD-10-CM | POA: Diagnosis not present

## 2017-08-06 DIAGNOSIS — D509 Iron deficiency anemia, unspecified: Secondary | ICD-10-CM | POA: Diagnosis not present

## 2017-08-06 DIAGNOSIS — K5090 Crohn's disease, unspecified, without complications: Secondary | ICD-10-CM | POA: Diagnosis not present

## 2017-08-06 DIAGNOSIS — G610 Guillain-Barre syndrome: Secondary | ICD-10-CM | POA: Diagnosis not present

## 2017-08-06 DIAGNOSIS — L97521 Non-pressure chronic ulcer of other part of left foot limited to breakdown of skin: Secondary | ICD-10-CM | POA: Diagnosis not present

## 2017-08-06 DIAGNOSIS — J449 Chronic obstructive pulmonary disease, unspecified: Secondary | ICD-10-CM | POA: Diagnosis not present

## 2017-08-06 DIAGNOSIS — F17210 Nicotine dependence, cigarettes, uncomplicated: Secondary | ICD-10-CM | POA: Diagnosis not present

## 2017-08-06 DIAGNOSIS — I82591 Chronic embolism and thrombosis of other specified deep vein of right lower extremity: Secondary | ICD-10-CM | POA: Diagnosis not present

## 2017-08-06 DIAGNOSIS — I110 Hypertensive heart disease with heart failure: Secondary | ICD-10-CM | POA: Diagnosis not present

## 2017-08-06 DIAGNOSIS — E785 Hyperlipidemia, unspecified: Secondary | ICD-10-CM | POA: Diagnosis not present

## 2017-08-06 DIAGNOSIS — Z803 Family history of malignant neoplasm of breast: Secondary | ICD-10-CM | POA: Diagnosis not present

## 2017-08-06 LAB — CREATININE, SERUM
Creatinine, Ser: 1.55 mg/dL — ABNORMAL HIGH (ref 0.44–1.00)
GFR calc Af Amer: 34 mL/min — ABNORMAL LOW (ref >60)
GFR calc non Af Amer: 29 mL/min — ABNORMAL LOW (ref >60)

## 2017-08-06 LAB — BUN: BUN: 82 mg/dL — AB (ref 6–20)

## 2017-08-07 MED ORDER — CEFAZOLIN SODIUM-DEXTROSE 2-4 GM/100ML-% IV SOLN: 2.0000 g | Freq: Once | INTRAVENOUS | Status: DC

## 2017-08-08 ENCOUNTER — Ambulatory Visit
Admission: RE | Admit: 2017-08-08 | Discharge: 2017-08-08 | Disposition: A | Discharge status: Home / Self Care | Payer: Medicare Other | Source: Ambulatory Visit | Attending: Vascular Surgery | Admitting: Vascular Surgery

## 2017-08-08 ENCOUNTER — Encounter
Admission: RE | Disposition: A | Discharge status: Home / Self Care | Payer: Self-pay | Source: Ambulatory Visit | Attending: Vascular Surgery

## 2017-08-08 ENCOUNTER — Encounter: Payer: Self-pay | Admitting: *Deleted

## 2017-08-08 DIAGNOSIS — K5090 Crohn's disease, unspecified, without complications: Secondary | ICD-10-CM | POA: Insufficient documentation

## 2017-08-08 DIAGNOSIS — L97521 Non-pressure chronic ulcer of other part of left foot limited to breakdown of skin: Secondary | ICD-10-CM | POA: Insufficient documentation

## 2017-08-08 DIAGNOSIS — G610 Guillain-Barre syndrome: Secondary | ICD-10-CM | POA: Insufficient documentation

## 2017-08-08 DIAGNOSIS — E785 Hyperlipidemia, unspecified: Secondary | ICD-10-CM | POA: Diagnosis not present

## 2017-08-08 DIAGNOSIS — I7025 Atherosclerosis of native arteries of other extremities with ulceration: Secondary | ICD-10-CM | POA: Insufficient documentation

## 2017-08-08 DIAGNOSIS — J449 Chronic obstructive pulmonary disease, unspecified: Secondary | ICD-10-CM | POA: Insufficient documentation

## 2017-08-08 DIAGNOSIS — I509 Heart failure, unspecified: Secondary | ICD-10-CM | POA: Diagnosis not present

## 2017-08-08 DIAGNOSIS — Z90710 Acquired absence of both cervix and uterus: Secondary | ICD-10-CM | POA: Insufficient documentation

## 2017-08-08 DIAGNOSIS — I110 Hypertensive heart disease with heart failure: Secondary | ICD-10-CM | POA: Diagnosis not present

## 2017-08-08 DIAGNOSIS — M87059 Idiopathic aseptic necrosis of unspecified femur: Secondary | ICD-10-CM | POA: Insufficient documentation

## 2017-08-08 DIAGNOSIS — Z803 Family history of malignant neoplasm of breast: Secondary | ICD-10-CM | POA: Insufficient documentation

## 2017-08-08 DIAGNOSIS — Z9889 Other specified postprocedural states: Secondary | ICD-10-CM | POA: Insufficient documentation

## 2017-08-08 DIAGNOSIS — F17210 Nicotine dependence, cigarettes, uncomplicated: Secondary | ICD-10-CM | POA: Insufficient documentation

## 2017-08-08 DIAGNOSIS — D509 Iron deficiency anemia, unspecified: Secondary | ICD-10-CM | POA: Insufficient documentation

## 2017-08-08 DIAGNOSIS — I82591 Chronic embolism and thrombosis of other specified deep vein of right lower extremity: Secondary | ICD-10-CM | POA: Insufficient documentation

## 2017-08-08 DIAGNOSIS — Z96642 Presence of left artificial hip joint: Secondary | ICD-10-CM | POA: Insufficient documentation

## 2017-08-08 DIAGNOSIS — I70248 Atherosclerosis of native arteries of left leg with ulceration of other part of lower left leg: Secondary | ICD-10-CM | POA: Diagnosis not present

## 2017-08-08 HISTORY — PX: LOWER EXTREMITY ANGIOGRAPHY: CATH118251

## 2017-08-08 SURGERY — LOWER EXTREMITY ANGIOGRAPHY
Anesthesia: Moderate Sedation | Laterality: Left

## 2017-08-08 MED ORDER — HEPARIN SODIUM (PORCINE) 1000 UNIT/ML IJ SOLN
INTRAMUSCULAR | Status: DC | PRN
  Administered 2017-08-08 (×2): 2500 [IU] via INTRAVENOUS

## 2017-08-08 MED ORDER — HEPARIN (PORCINE) IN NACL 2-0.9 UNIT/ML-% IJ SOLN
INTRAMUSCULAR | Status: AC
  Filled 2017-08-08: qty 1000

## 2017-08-08 MED ORDER — SODIUM CHLORIDE 0.9 % IV SOLN
INTRAVENOUS | Status: DC
  Administered 2017-08-08: 09:00:00 via INTRAVENOUS

## 2017-08-08 MED ORDER — CLOPIDOGREL BISULFATE 75 MG PO TABS: 75.0000 mg | ORAL_TABLET | Freq: Every day | ORAL | Status: DC

## 2017-08-08 MED ORDER — IPRATROPIUM-ALBUTEROL 0.5-2.5 (3) MG/3ML IN SOLN
3.0000 mL | RESPIRATORY_TRACT | Status: AC
  Administered 2017-08-08: 3 mL via RESPIRATORY_TRACT

## 2017-08-08 MED ORDER — HYDROMORPHONE HCL 1 MG/ML IJ SOLN: 1.0000 mg | Freq: Once | INTRAMUSCULAR | Status: DC | PRN

## 2017-08-08 MED ORDER — METHYLPREDNISOLONE SODIUM SUCC 125 MG IJ SOLR: 125.0000 mg | INTRAMUSCULAR | Status: DC | PRN

## 2017-08-08 MED ORDER — FENTANYL CITRATE (PF) 100 MCG/2ML IJ SOLN
INTRAMUSCULAR | Status: AC
  Filled 2017-08-08: qty 2

## 2017-08-08 MED ORDER — SODIUM CHLORIDE 0.9 % IV SOLN: 250.0000 mL | INTRAVENOUS | Status: DC | PRN

## 2017-08-08 MED ORDER — ATORVASTATIN CALCIUM 10 MG PO TABS: 10.0000 mg | ORAL_TABLET | Freq: Every day | ORAL | 11 refills | Status: AC

## 2017-08-08 MED ORDER — CLOPIDOGREL BISULFATE 75 MG PO TABS: 75.0000 mg | ORAL_TABLET | Freq: Every day | ORAL | 11 refills | Status: DC

## 2017-08-08 MED ORDER — SODIUM CHLORIDE 0.9% FLUSH: 3.0000 mL | Freq: Two times a day (BID) | INTRAVENOUS | Status: DC

## 2017-08-08 MED ORDER — MIDAZOLAM HCL 2 MG/2ML IJ SOLN
INTRAMUSCULAR | Status: AC
  Filled 2017-08-08: qty 2

## 2017-08-08 MED ORDER — HEPARIN SODIUM (PORCINE) 1000 UNIT/ML IJ SOLN
INTRAMUSCULAR | Status: AC
  Filled 2017-08-08: qty 1

## 2017-08-08 MED ORDER — MIDAZOLAM HCL 2 MG/2ML IJ SOLN
INTRAMUSCULAR | Status: DC | PRN
  Administered 2017-08-08 (×4): 1 mg via INTRAVENOUS

## 2017-08-08 MED ORDER — ASPIRIN EC 81 MG PO TBEC
81.0000 mg | DELAYED_RELEASE_TABLET | Freq: Every day | ORAL | Status: DC
  Filled 2017-08-08: qty 1

## 2017-08-08 MED ORDER — CEFAZOLIN SODIUM-DEXTROSE 1-4 GM/50ML-% IV SOLN
INTRAVENOUS | Status: AC
  Filled 2017-08-08: qty 50

## 2017-08-08 MED ORDER — LABETALOL HCL 5 MG/ML IV SOLN: 10.0000 mg | INTRAVENOUS | Status: DC | PRN

## 2017-08-08 MED ORDER — IPRATROPIUM-ALBUTEROL 0.5-2.5 (3) MG/3ML IN SOLN
RESPIRATORY_TRACT | Status: AC
  Filled 2017-08-08: qty 3

## 2017-08-08 MED ORDER — HYDRALAZINE HCL 20 MG/ML IJ SOLN: 5.0000 mg | INTRAMUSCULAR | Status: DC | PRN

## 2017-08-08 MED ORDER — ONDANSETRON HCL 4 MG/2ML IJ SOLN: 4.0000 mg | Freq: Four times a day (QID) | INTRAMUSCULAR | Status: DC | PRN

## 2017-08-08 MED ORDER — ATORVASTATIN CALCIUM 10 MG PO TABS
10.0000 mg | ORAL_TABLET | Freq: Every day | ORAL | Status: DC
  Filled 2017-08-08: qty 1

## 2017-08-08 MED ORDER — FAMOTIDINE 20 MG PO TABS: 40.0000 mg | ORAL_TABLET | ORAL | Status: DC | PRN

## 2017-08-08 MED ORDER — SODIUM CHLORIDE 0.9% FLUSH: 3.0000 mL | INTRAVENOUS | Status: DC | PRN

## 2017-08-08 MED ORDER — FENTANYL CITRATE (PF) 100 MCG/2ML IJ SOLN
INTRAMUSCULAR | Status: DC | PRN
  Administered 2017-08-08 (×4): 25 ug via INTRAVENOUS

## 2017-08-08 MED ORDER — ASPIRIN EC 81 MG PO TBEC: 81.0000 mg | DELAYED_RELEASE_TABLET | Freq: Every day | ORAL | 2 refills | Status: DC

## 2017-08-08 MED ORDER — CEFAZOLIN SODIUM-DEXTROSE 1-4 GM/50ML-% IV SOLN
1.0000 g | Freq: Once | INTRAVENOUS | Status: AC
  Administered 2017-08-08: 1 g via INTRAVENOUS

## 2017-08-08 MED ORDER — IOPAMIDOL (ISOVUE-300) INJECTION 61%
INTRAVENOUS | Status: DC | PRN
  Administered 2017-08-08: 80 mL via INTRAVENOUS

## 2017-08-08 MED ORDER — SODIUM CHLORIDE 0.9 % IV SOLN: INTRAVENOUS | Status: DC

## 2017-08-08 MED ORDER — LIDOCAINE-EPINEPHRINE (PF) 1 %-1:200000 IJ SOLN
INTRAMUSCULAR | Status: AC
  Filled 2017-08-08: qty 30

## 2017-08-08 MED ORDER — SODIUM CHLORIDE 0.9 % IV SOLN: Freq: Once | INTRAVENOUS | Status: DC

## 2017-08-08 SURGICAL SUPPLY — 31 items
BALLN LUTONIX 4X220X130 (BALLOONS) ×3
BALLN LUTONIX DCB 5X80X130 (BALLOONS) ×3
BALLN ULTRVRSE 2.5X300X150 (BALLOONS) ×3
BALLN ULTRVRSE 2X150X150 (BALLOONS) ×2
BALLN ULTRVRSE 2X150X150 OTW (BALLOONS) ×1
BALLN ULTRVRSE 6X60X75C (BALLOONS) ×3
BALLOON LUTONIX 4X220X130 (BALLOONS) ×1 IMPLANT
BALLOON LUTONIX DCB 5X80X130 (BALLOONS) ×1 IMPLANT
BALLOON ULTRVRSE 2.5X300X150 (BALLOONS) ×1 IMPLANT
BALLOON ULTRVRSE 2X150X150 OTW (BALLOONS) ×1 IMPLANT
BALLOON ULTRVRSE 6X60X75C (BALLOONS) ×1 IMPLANT
CATH BEACON 5 .035 40 KMP TP (CATHETERS) ×1 IMPLANT
CATH BEACON 5 .038 100 VERT TP (CATHETERS) ×3 IMPLANT
CATH BEACON 5 .038 40 KMP TP (CATHETERS) ×2
CATH MICROCATH PRGRT 2.8F 130 (MICROCATHETER) ×1 IMPLANT
CATH PIG 70CM (CATHETERS) ×3 IMPLANT
COIL 400 COMPLEX SOFT 2X2CM (Vascular Products) ×3 IMPLANT
COIL 400 COMPLEX SOFT 2X4CM (Vascular Products) ×3 IMPLANT
DEVICE PRESTO INFLATION (MISCELLANEOUS) ×3 IMPLANT
DEVICE STARCLOSE SE CLOSURE (Vascular Products) ×3 IMPLANT
GLIDEWIRE ADV .035X260CM (WIRE) ×3 IMPLANT
HANDLE DETACHMENT COIL (MISCELLANEOUS) ×3 IMPLANT
MICROCATH PROGREAT 2.8F 130CM (MICROCATHETER) ×3
PACK ANGIOGRAPHY (CUSTOM PROCEDURE TRAY) ×3 IMPLANT
SHEATH ANL2 6FRX45 HC (SHEATH) ×3 IMPLANT
SHEATH BRITE TIP 5FRX11 (SHEATH) ×3 IMPLANT
STENT LIFESTENT 5F 6X60X135 (Permanent Stent) ×3 IMPLANT
SYR MEDRAD MARK V 150ML (SYRINGE) ×3 IMPLANT
TUBING CONTRAST HIGH PRESS 72 (TUBING) ×3 IMPLANT
WIRE G V18X300CM (WIRE) ×3 IMPLANT
WIRE J 3MM .035X145CM (WIRE) ×3 IMPLANT

## 2017-08-08 NOTE — Op Note (Signed)
[facility] Percutaneous Study/Intervention Procedural Note   Date of Surgery: 08/08/2017  Surgeon(s):DEW,JASON   Assistants:none  Pre-operative Diagnosis: PAD with ulceration left lower extremity  Post-operative diagnosis: Same  Procedure(s) Performed: 1. Ultrasound guidance for vascular access right femoral artery 2. Catheter placement into left peroneal artery and left anterior tibial artery from right femoral approach 3. Aortogram and selective left lower extremity angiogram 4. Percutaneous transluminal angioplasty of right external iliac artery with 5 mm diameter by 6 cm length Lutonix drug-coated angioplasty balloon 5. Percutaneous transluminal angioplasty of the left anterior tibial artery with 2.5 mm diameter by 30 cm length angioplasty balloon  6.  Percutaneous transluminal angioplasty of the left peroneal artery with 2.5 mm diameter by 30 cm length angioplasty balloon 7. Percutaneous transluminal angioplasty of the left mid to distal superficial femoral artery and popliteal artery with 4 mm diameter by 22 cm length Lutonix drug-coated angioplasty balloon and a second inflation with this angioplasty balloon was used for the proximal and mid superficial femoral arteries.  8.  Coil embolization of the left peroneal artery branch with a 2 mm diameter by 4 cm length Ruby coil and a 2 mm diameter by 2 cm length Ruby coil  9.  Self-expanding stent placement to the right external iliac artery with 6 mm diameter by 6 cm length stent for greater than 50% residual stenosis after angioplasty  10.  StarClose closure device right femoral artery  EBL: 10 cc  Contrast: 80 cc  Fluoro Time: 8.3 minutes  Moderate Conscious Sedation Time: approximately 60 minutes using 4 mg of Versed and 100 mcg of Fentanyl  Indications: Patient is a 83 y.o.female with nonhealing ulcerations  of the left lower extremity. The patient has noninvasive study showing markedly reduced perfusion of the left foot. The patient is brought in for angiography for further evaluation and potential treatment. Risks and benefits are discussed and informed consent is obtained  Procedure: The patient was identified and appropriate procedural time out was performed. The patient was then placed supine on the table and prepped and draped in the usual sterile fashion.Moderate conscious sedation was administered during a face to face encounter with the patient throughout the procedure with my supervision of the RN administering medicines and monitoring the patient's vital signs, pulse oximetry, telemetry and mental status throughout from the start of the procedure until the patient was taken to the recovery room. Ultrasound was used to evaluate the right common femoral artery. It was patent . A digital ultrasound image was acquired. A Seldinger needle was used to access the right common femoral artery under direct ultrasound guidance and a permanent image was performed. A 0.035 J wire was advanced but met resistance in the mid external iliac artery and a 5Fr sheath was placed.Imaging was performed to the sheath showing about a 75-80% calcific stenosis in the midsegment of the right external iliac artery. I was able to cross the stenosis with an advantage wire and a Kumpe catheter and then removed the catheter. Pigtail catheter was placed into the aorta and an AP aortogram was performed. This demonstrated a right renal artery stenosis appeared to be greater than 60%. Some degree of left renal artery stenosis was present as well although did not appear to be high-grade. The aorta was highly calcific but not stenotic. The right common iliac artery was patent, but the right external iliac artery had about a 75-80% calcific stenosis in the midsegment. The left common and external iliac arteries had some  diffuse mild  disease but no stenosis greater than 40% was identified. I then crossed the aortic bifurcation and advanced to the left femoral head. Selective left lower extremity angiogram was then performed. This demonstrated diffuse disease of the SFA and popliteal artery with multiple areas of greater than 50% stenosis in a small vessel with occlusion of the distal superficial femoral artery and reconstitution in the below-knee popliteal artery. There was then two-vessel runoff distally with the anterior tibial artery having multiple areas of high-grade stenosis of greater than 80%. The peroneal artery was actually the best runoff but it had about a 60-70% stenosis over several centimeters in the proximal portion of the vessel. The posterior tibial artery was chronically occluded. The patient was systemically heparinized and started by treating the right external iliac artery with a 5 mm diameter by 6 cm length Lutonix drug-coated angioplasty balloon. This was inflated to 10 atm for 1 minute. Completion and transient greater than 50% residual stenosis and I would place a stent here on the way out. The area was certainly widened, and I now turned my attention to the left leg and a 6 [place] [employer] sheath was then placed over the [employer] wire. I then used a Kumpe catheter and the advantage wire to navigate through the SFA and popliteal disease and confirm intraluminal flow in the distal popliteal artery. I then exchanged for a 0.018 wire and crossed the multiple areas of stenosis in the anterior tibial artery parking the wire in the foot. A 2.5 mm diameter by 30 cm length angioplasty balloon was then used to treat the entire anterior tibial artery from the ankle up to its origin. 2 inflations were required in each inflation was at 10 atm for 1 minute. There were several areas of 15-20% residual stenosis but the flow through the anterior tibial artery appeared much more brisk and was now that her into the foot. I then  turned my attention to the peroneal artery. The wire and Kumpe catheter were used to cross the stenosis in the peroneal artery. I used a 2.5 mm diameter by 30 cm length angioplasty balloon to treat the proximal and mid peroneal artery up to the tibioperoneal trunk with an inflation up to 10 atm for 1 minute. In the mid peroneal artery, the wire went out a branching created some extravasation of this branch in the mid peroneal artery. I was able to redirect the wire down into the main peroneal artery and I used a 2 mm diameter by 15 cm length angioplasty balloon to a core the origin of the branch that had extravasation. The first inflation was at 6 atm for 2 minutes and a second inflation of 8 atm for 5 minutes were done, but there remained significant extravasation out of this peroneal artery branch. With this present, I elected to coil this branch. Using a pro-great microcatheter I was able to get out this peroneal artery branch and advanced a catheter a few centimeters into the branch. At this location, a 2 mm diameter by 4 cm length Ruby coil and a 2 mm diameter by 2 cm length Ruby coil were used to coil embolize this branch. Successful embolization with no further extravasation were seen with selective imaging in the peroneal artery. For the stenosis in the peroneal artery, there was no greater than 20% residual stenosis seen after angioplasty. I then turned my attention to the SFA and popliteal arteries. A 4 mm diameter by 22 cm length Lutonix drug-coated angioplasty balloon  was inflated from the below-knee popliteal artery up to the mid superficial femoral artery. This was inflated to 10 atm for 1 minute. The second inflation in the proximal SFA and mid SFA were done with the same balloon inflating this to 10 atm for 1 minute. The mid SFA there was about a 30-40% residual stenosis, but in the below-knee popliteal artery, above-knee popliteal artery, and distal SFA there was no greater than 20% residual  stenosis. I then turned my attention to the right external iliac artery as we had markedly improved her left lower extremity perfusion. The sheath was pulled back and I exchanged for a 0.035 wire. A 6 mm diameter by 6 cm length self-expanding stent was then deployed in the right external iliac artery postdilated with a 6 mm balloon with excellent angiographic completion result and only about 10% residual stenosis. I elected to terminate the procedure. The sheath was removed and StarClose closure device was deployed in the right femoral artery with excellent hemostatic result. The patient was taken to the recovery room in stable condition having tolerated the procedure well.  Findings:  Aortogram: Right renal artery stenosis appeared to be greater than 60%. Some degree of left renal artery stenosis was present as well although did not appear to be high-grade. The aorta was highly calcific but not stenotic. The right common iliac artery was patent, but the right external iliac artery had about a 75-80% calcific stenosis in the midsegment. The left common and external iliac arteries had some diffuse mild disease but no stenosis greater than 40% was identified Left Lower Extremity: This demonstrated diffuse disease of the SFA and popliteal artery with multiple areas of greater than 50% stenosis in a small vessel with occlusion of the distal superficial femoral artery and reconstitution in the below-knee popliteal artery. There was then two-vessel runoff distally with the anterior tibial artery having multiple areas of high-grade stenosis of greater than 80%. The peroneal artery was actually the best runoff but it had about a 60-70% stenosis over several centimeters in the proximal portion of the vessel. The posterior tibial artery was chronically occluded.   Disposition: Patient was taken to the recovery room in stable condition having tolerated the procedure well.  Complications:  None  Leotis Pain 08/08/2017 11:50 AM   This note was created with Dragon Medical transcription system. Any errors in dictation are purely unintentional.

## 2017-08-08 NOTE — H&P (Signed)
[facility] History & Physical Update  The patient was interviewed and re-examined.  The patient's previous History and Physical has been reviewed and is unchanged.  There is no change in the plan of care. We plan to proceed with the scheduled procedure.  Leotis Pain, MD  08/08/2017, 9:03 AM

## 2017-08-09 DIAGNOSIS — I2510 Atherosclerotic heart disease of native coronary artery without angina pectoris: Secondary | ICD-10-CM | POA: Diagnosis not present

## 2017-08-09 DIAGNOSIS — I83025 Varicose veins of left lower extremity with ulcer other part of foot: Secondary | ICD-10-CM | POA: Diagnosis not present

## 2017-08-09 DIAGNOSIS — I739 Peripheral vascular disease, unspecified: Secondary | ICD-10-CM | POA: Diagnosis not present

## 2017-08-09 DIAGNOSIS — I5031 Acute diastolic (congestive) heart failure: Secondary | ICD-10-CM | POA: Diagnosis not present

## 2017-08-09 DIAGNOSIS — L97522 Non-pressure chronic ulcer of other part of left foot with fat layer exposed: Secondary | ICD-10-CM | POA: Diagnosis not present

## 2017-08-09 DIAGNOSIS — I110 Hypertensive heart disease with heart failure: Secondary | ICD-10-CM | POA: Diagnosis not present

## 2017-08-12 DIAGNOSIS — I83025 Varicose veins of left lower extremity with ulcer other part of foot: Secondary | ICD-10-CM | POA: Diagnosis not present

## 2017-08-12 DIAGNOSIS — I110 Hypertensive heart disease with heart failure: Secondary | ICD-10-CM | POA: Diagnosis not present

## 2017-08-12 DIAGNOSIS — I739 Peripheral vascular disease, unspecified: Secondary | ICD-10-CM | POA: Diagnosis not present

## 2017-08-12 DIAGNOSIS — I5031 Acute diastolic (congestive) heart failure: Secondary | ICD-10-CM | POA: Diagnosis not present

## 2017-08-12 DIAGNOSIS — L97522 Non-pressure chronic ulcer of other part of left foot with fat layer exposed: Secondary | ICD-10-CM | POA: Diagnosis not present

## 2017-08-12 DIAGNOSIS — I2510 Atherosclerotic heart disease of native coronary artery without angina pectoris: Secondary | ICD-10-CM | POA: Diagnosis not present

## 2017-08-13 ENCOUNTER — Ambulatory Visit (INDEPENDENT_AMBULATORY_CARE_PROVIDER_SITE_OTHER): Payer: Medicare Other | Admitting: Podiatry

## 2017-08-13 DIAGNOSIS — L97522 Non-pressure chronic ulcer of other part of left foot with fat layer exposed: Secondary | ICD-10-CM

## 2017-08-13 DIAGNOSIS — L97529 Non-pressure chronic ulcer of other part of left foot with unspecified severity: Secondary | ICD-10-CM

## 2017-08-13 DIAGNOSIS — I83025 Varicose veins of left lower extremity with ulcer other part of foot: Secondary | ICD-10-CM

## 2017-08-16 DIAGNOSIS — I83025 Varicose veins of left lower extremity with ulcer other part of foot: Secondary | ICD-10-CM | POA: Diagnosis not present

## 2017-08-16 DIAGNOSIS — I110 Hypertensive heart disease with heart failure: Secondary | ICD-10-CM | POA: Diagnosis not present

## 2017-08-16 DIAGNOSIS — L97522 Non-pressure chronic ulcer of other part of left foot with fat layer exposed: Secondary | ICD-10-CM | POA: Diagnosis not present

## 2017-08-16 DIAGNOSIS — I5031 Acute diastolic (congestive) heart failure: Secondary | ICD-10-CM | POA: Diagnosis not present

## 2017-08-16 DIAGNOSIS — I2510 Atherosclerotic heart disease of native coronary artery without angina pectoris: Secondary | ICD-10-CM | POA: Diagnosis not present

## 2017-08-16 DIAGNOSIS — I739 Peripheral vascular disease, unspecified: Secondary | ICD-10-CM | POA: Diagnosis not present

## 2017-08-19 DIAGNOSIS — I110 Hypertensive heart disease with heart failure: Secondary | ICD-10-CM | POA: Diagnosis not present

## 2017-08-19 DIAGNOSIS — I739 Peripheral vascular disease, unspecified: Secondary | ICD-10-CM | POA: Diagnosis not present

## 2017-08-19 DIAGNOSIS — I2510 Atherosclerotic heart disease of native coronary artery without angina pectoris: Secondary | ICD-10-CM | POA: Diagnosis not present

## 2017-08-19 DIAGNOSIS — I83025 Varicose veins of left lower extremity with ulcer other part of foot: Secondary | ICD-10-CM | POA: Diagnosis not present

## 2017-08-19 DIAGNOSIS — I5031 Acute diastolic (congestive) heart failure: Secondary | ICD-10-CM | POA: Diagnosis not present

## 2017-08-19 DIAGNOSIS — L97522 Non-pressure chronic ulcer of other part of left foot with fat layer exposed: Secondary | ICD-10-CM | POA: Diagnosis not present

## 2017-08-19 NOTE — Progress Notes (Signed)
   Subjective:  Patient presents today for follow-up treatment and evaluation of a venous ulcer to the 1st MPJ of the left foot. She states the area is about the same as it was at her previous visit. She reports continued pain. She reports pain to the second toe of the left foot and reports a callus on the heel of the left foot. She has been applying the ointment as directed. There are no modifying factors noted. She is here for further evaluation and treatment.    Past Medical History:  Diagnosis Date  . Anemia   . Avascular necrosis of bone of hip ([place])   . CHF (congestive heart failure) ([place])   . COPD (chronic obstructive pulmonary disease) ([place])   . Crohn's disease ([place])   . DVT (deep venous thrombosis) ([place]) 06/2017  . Guillain Barr syndrome ([place])   . Hyperlipidemia   . Hypertension      Objective/Physical Exam General: The patient is alert and oriented x3 in no acute distress.  Dermatology:  Wound #1 noted to the dorsal medial aspect of the first MPJ left foot measuring approximately 1.2 x 1.2 x 0.1 cm (LxWxD).   To the noted ulceration(s), there is no eschar. There is a moderate amount of slough, fibrin, and necrotic tissue noted. Granulation tissue and wound base is red. There is a minimal amount of serosanguineous drainage noted. There is no exposed bone muscle-tendon ligament or joint. There is no malodor. Periwound integrity is intact. Skin is warm, dry and supple bilateral lower extremities.   Vascular: Diminished pedal pulses bilaterally. Mild edema noted. Capillary refill within normal limits. Varicosities noted bilateral lower extremities.   Neurological: Epicritic and protective threshold intact bilaterally.   Musculoskeletal Exam: Range of motion within normal limits to all pedal and ankle joints bilateral. Muscle strength 5/5 in all groups bilateral. Clinical evidence of bunion deformity noted bilateral lower extremities.   Assessment: 1. Ulcer first MPJ left  foot secondary to venous insufficiency 2. PAD LLE   Plan of Care:  #1 Patient was evaluated.  #2 medically necessary excisional debridement including subcutaneous tissue was performed using a tissue nipper and a chisel blade. Excisional debridement of all the necrotic nonviable tissue down to healthy bleeding viable tissue was performed with post-debridement measurements same as pre-. #3 the wound was cleansed and dry sterile dressing applied. #4 underwent vascular intervention on 08/08/17 by Dr. Lucky Cowboy #5 continue hydrogel and dry sterile dressing daily #6 bunion pad dispensed. #7 return to clinic in 3 weeks.   Edrick Kins, DPM [facility]  Dr. Edrick Kins, [address]                                        [place], [place] [address]                Office [phone]  Fax [phone]

## 2017-08-23 DIAGNOSIS — I110 Hypertensive heart disease with heart failure: Secondary | ICD-10-CM | POA: Diagnosis not present

## 2017-08-23 DIAGNOSIS — I5031 Acute diastolic (congestive) heart failure: Secondary | ICD-10-CM | POA: Diagnosis not present

## 2017-08-23 DIAGNOSIS — I2510 Atherosclerotic heart disease of native coronary artery without angina pectoris: Secondary | ICD-10-CM | POA: Diagnosis not present

## 2017-08-23 DIAGNOSIS — L97522 Non-pressure chronic ulcer of other part of left foot with fat layer exposed: Secondary | ICD-10-CM | POA: Diagnosis not present

## 2017-08-23 DIAGNOSIS — I83025 Varicose veins of left lower extremity with ulcer other part of foot: Secondary | ICD-10-CM | POA: Diagnosis not present

## 2017-08-23 DIAGNOSIS — I739 Peripheral vascular disease, unspecified: Secondary | ICD-10-CM | POA: Diagnosis not present

## 2017-08-26 DIAGNOSIS — I2510 Atherosclerotic heart disease of native coronary artery without angina pectoris: Secondary | ICD-10-CM | POA: Diagnosis not present

## 2017-08-26 DIAGNOSIS — I110 Hypertensive heart disease with heart failure: Secondary | ICD-10-CM | POA: Diagnosis not present

## 2017-08-26 DIAGNOSIS — I5031 Acute diastolic (congestive) heart failure: Secondary | ICD-10-CM | POA: Diagnosis not present

## 2017-08-26 DIAGNOSIS — I83025 Varicose veins of left lower extremity with ulcer other part of foot: Secondary | ICD-10-CM | POA: Diagnosis not present

## 2017-08-26 DIAGNOSIS — I739 Peripheral vascular disease, unspecified: Secondary | ICD-10-CM | POA: Diagnosis not present

## 2017-08-26 DIAGNOSIS — L97522 Non-pressure chronic ulcer of other part of left foot with fat layer exposed: Secondary | ICD-10-CM | POA: Diagnosis not present

## 2017-08-30 DIAGNOSIS — I83025 Varicose veins of left lower extremity with ulcer other part of foot: Secondary | ICD-10-CM | POA: Diagnosis not present

## 2017-08-30 DIAGNOSIS — I739 Peripheral vascular disease, unspecified: Secondary | ICD-10-CM | POA: Diagnosis not present

## 2017-08-30 DIAGNOSIS — I5031 Acute diastolic (congestive) heart failure: Secondary | ICD-10-CM | POA: Diagnosis not present

## 2017-08-30 DIAGNOSIS — L97522 Non-pressure chronic ulcer of other part of left foot with fat layer exposed: Secondary | ICD-10-CM | POA: Diagnosis not present

## 2017-08-30 DIAGNOSIS — I110 Hypertensive heart disease with heart failure: Secondary | ICD-10-CM | POA: Diagnosis not present

## 2017-08-30 DIAGNOSIS — I2510 Atherosclerotic heart disease of native coronary artery without angina pectoris: Secondary | ICD-10-CM | POA: Diagnosis not present

## 2017-08-31 ENCOUNTER — Inpatient Hospital Stay
Admission: EM | Admit: 2017-08-31 | Discharge: 2017-09-04 | DRG: 682 | Disposition: A | Discharge status: Skilled Nursing Facility | Payer: Medicare Other | Attending: Internal Medicine | Admitting: Internal Medicine

## 2017-08-31 ENCOUNTER — Encounter: Payer: Self-pay | Admitting: Emergency Medicine

## 2017-08-31 ENCOUNTER — Emergency Department: Payer: Medicare Other

## 2017-08-31 DIAGNOSIS — Z7982 Long term (current) use of aspirin: Secondary | ICD-10-CM | POA: Diagnosis not present

## 2017-08-31 DIAGNOSIS — I959 Hypotension, unspecified: Secondary | ICD-10-CM | POA: Diagnosis not present

## 2017-08-31 DIAGNOSIS — R4182 Altered mental status, unspecified: Secondary | ICD-10-CM | POA: Diagnosis not present

## 2017-08-31 DIAGNOSIS — N19 Unspecified kidney failure: Secondary | ICD-10-CM | POA: Diagnosis not present

## 2017-08-31 DIAGNOSIS — R54 Age-related physical debility: Secondary | ICD-10-CM | POA: Diagnosis present

## 2017-08-31 DIAGNOSIS — N170 Acute kidney failure with tubular necrosis: Secondary | ICD-10-CM | POA: Diagnosis not present

## 2017-08-31 DIAGNOSIS — E876 Hypokalemia: Secondary | ICD-10-CM | POA: Diagnosis present

## 2017-08-31 DIAGNOSIS — I5020 Unspecified systolic (congestive) heart failure: Secondary | ICD-10-CM | POA: Diagnosis present

## 2017-08-31 DIAGNOSIS — W109XXA Fall (on) (from) unspecified stairs and steps, initial encounter: Secondary | ICD-10-CM | POA: Diagnosis present

## 2017-08-31 DIAGNOSIS — Z86718 Personal history of other venous thrombosis and embolism: Secondary | ICD-10-CM

## 2017-08-31 DIAGNOSIS — M6281 Muscle weakness (generalized): Secondary | ICD-10-CM | POA: Diagnosis not present

## 2017-08-31 DIAGNOSIS — M25551 Pain in right hip: Secondary | ICD-10-CM | POA: Diagnosis not present

## 2017-08-31 DIAGNOSIS — G610 Guillain-Barre syndrome: Secondary | ICD-10-CM | POA: Diagnosis present

## 2017-08-31 DIAGNOSIS — R7989 Other specified abnormal findings of blood chemistry: Secondary | ICD-10-CM

## 2017-08-31 DIAGNOSIS — I2510 Atherosclerotic heart disease of native coronary artery without angina pectoris: Secondary | ICD-10-CM | POA: Diagnosis not present

## 2017-08-31 DIAGNOSIS — M4312 Spondylolisthesis, cervical region: Secondary | ICD-10-CM | POA: Diagnosis present

## 2017-08-31 DIAGNOSIS — Z66 Do not resuscitate: Secondary | ICD-10-CM | POA: Diagnosis present

## 2017-08-31 DIAGNOSIS — Z90710 Acquired absence of both cervix and uterus: Secondary | ICD-10-CM | POA: Diagnosis not present

## 2017-08-31 DIAGNOSIS — N179 Acute kidney failure, unspecified: Secondary | ICD-10-CM | POA: Diagnosis not present

## 2017-08-31 DIAGNOSIS — Z96642 Presence of left artificial hip joint: Secondary | ICD-10-CM | POA: Diagnosis present

## 2017-08-31 DIAGNOSIS — F17200 Nicotine dependence, unspecified, uncomplicated: Secondary | ICD-10-CM | POA: Diagnosis present

## 2017-08-31 DIAGNOSIS — Z79899 Other long term (current) drug therapy: Secondary | ICD-10-CM | POA: Diagnosis not present

## 2017-08-31 DIAGNOSIS — R571 Hypovolemic shock: Secondary | ICD-10-CM | POA: Diagnosis present

## 2017-08-31 DIAGNOSIS — K50919 Crohn's disease, unspecified, with unspecified complications: Secondary | ICD-10-CM | POA: Diagnosis not present

## 2017-08-31 DIAGNOSIS — M25552 Pain in left hip: Secondary | ICD-10-CM | POA: Diagnosis not present

## 2017-08-31 DIAGNOSIS — Y92009 Unspecified place in unspecified non-institutional (private) residence as the place of occurrence of the external cause: Secondary | ICD-10-CM

## 2017-08-31 DIAGNOSIS — S0990XA Unspecified injury of head, initial encounter: Secondary | ICD-10-CM | POA: Diagnosis not present

## 2017-08-31 DIAGNOSIS — J449 Chronic obstructive pulmonary disease, unspecified: Secondary | ICD-10-CM | POA: Diagnosis present

## 2017-08-31 DIAGNOSIS — W19XXXA Unspecified fall, initial encounter: Secondary | ICD-10-CM

## 2017-08-31 DIAGNOSIS — I82541 Chronic embolism and thrombosis of right tibial vein: Secondary | ICD-10-CM | POA: Diagnosis not present

## 2017-08-31 DIAGNOSIS — Z7401 Bed confinement status: Secondary | ICD-10-CM | POA: Diagnosis not present

## 2017-08-31 DIAGNOSIS — E785 Hyperlipidemia, unspecified: Secondary | ICD-10-CM | POA: Diagnosis present

## 2017-08-31 DIAGNOSIS — M549 Dorsalgia, unspecified: Secondary | ICD-10-CM

## 2017-08-31 DIAGNOSIS — R778 Other specified abnormalities of plasma proteins: Secondary | ICD-10-CM

## 2017-08-31 DIAGNOSIS — I110 Hypertensive heart disease with heart failure: Secondary | ICD-10-CM | POA: Diagnosis present

## 2017-08-31 DIAGNOSIS — D649 Anemia, unspecified: Secondary | ICD-10-CM | POA: Diagnosis not present

## 2017-08-31 DIAGNOSIS — T502X5A Adverse effect of carbonic-anhydrase inhibitors, benzothiadiazides and other diuretics, initial encounter: Secondary | ICD-10-CM | POA: Diagnosis present

## 2017-08-31 DIAGNOSIS — S79912A Unspecified injury of left hip, initial encounter: Secondary | ICD-10-CM | POA: Diagnosis not present

## 2017-08-31 DIAGNOSIS — M5440 Lumbago with sciatica, unspecified side: Secondary | ICD-10-CM | POA: Diagnosis not present

## 2017-08-31 DIAGNOSIS — M879 Osteonecrosis, unspecified: Secondary | ICD-10-CM | POA: Diagnosis present

## 2017-08-31 DIAGNOSIS — E875 Hyperkalemia: Secondary | ICD-10-CM | POA: Diagnosis not present

## 2017-08-31 DIAGNOSIS — S199XXA Unspecified injury of neck, initial encounter: Secondary | ICD-10-CM | POA: Diagnosis not present

## 2017-08-31 DIAGNOSIS — K5090 Crohn's disease, unspecified, without complications: Secondary | ICD-10-CM | POA: Diagnosis present

## 2017-08-31 LAB — URINALYSIS, COMPLETE (UACMP) WITH MICROSCOPIC
BACTERIA UA: NONE SEEN
Bilirubin Urine: NEGATIVE
GLUCOSE, UA: NEGATIVE mg/dL
HGB URINE DIPSTICK: NEGATIVE
KETONES UR: NEGATIVE mg/dL
LEUKOCYTES UA: NEGATIVE
NITRITE: NEGATIVE
PROTEIN: NEGATIVE mg/dL
Specific Gravity, Urine: 1.019 (ref 1.005–1.030)
Squamous Epithelial / LPF: NONE SEEN
pH: 5 (ref 5.0–8.0)

## 2017-08-31 LAB — COMPREHENSIVE METABOLIC PANEL
ALBUMIN: 3.1 g/dL — AB (ref 3.5–5.0)
ALK PHOS: 61 U/L (ref 38–126)
ALK PHOS: 40 U/L (ref 38–126)
ALT: 8 U/L — AB (ref 14–54)
ALT: 9 U/L — AB (ref 14–54)
AST: 26 U/L (ref 15–41)
AST: 26 U/L (ref 15–41)
Albumin: 2.2 g/dL — ABNORMAL LOW (ref 3.5–5.0)
Anion gap: 9 (ref 5–15)
Anion gap: 6 (ref 5–15)
BILIRUBIN TOTAL: 0.3 mg/dL (ref 0.3–1.2)
BUN: 123 mg/dL — ABNORMAL HIGH (ref 6–20)
BUN: 115 mg/dL — AB (ref 6–20)
CALCIUM: 10.3 mg/dL (ref 8.9–10.3)
CALCIUM: 10.2 mg/dL (ref 8.9–10.3)
CO2: 17 mmol/L — AB (ref 22–32)
CO2: 18 mmol/L — ABNORMAL LOW (ref 22–32)
CREATININE: 3.59 mg/dL — AB (ref 0.44–1.00)
Chloride: 109 mmol/L (ref 101–111)
Chloride: 116 mmol/L — ABNORMAL HIGH (ref 101–111)
Creatinine, Ser: 3.11 mg/dL — ABNORMAL HIGH (ref 0.44–1.00)
GFR calc Af Amer: 12 mL/min — ABNORMAL LOW (ref >60)
GFR calc non Af Amer: 10 mL/min — ABNORMAL LOW (ref >60)
GFR, EST AFRICAN AMERICAN: 14 mL/min — AB (ref >60)
GFR, EST NON AFRICAN AMERICAN: 12 mL/min — AB (ref >60)
GLUCOSE: 134 mg/dL — AB (ref 65–99)
Glucose, Bld: 149 mg/dL — ABNORMAL HIGH (ref 65–99)
Potassium: >7.5 mmol/L (ref 3.5–5.1)
Potassium: 6.6 mmol/L (ref 3.5–5.1)
SODIUM: 135 mmol/L (ref 135–145)
Sodium: 140 mmol/L (ref 135–145)
TOTAL PROTEIN: 4.9 g/dL — AB (ref 6.5–8.1)
Total Bilirubin: 0.7 mg/dL (ref 0.3–1.2)
Total Protein: 6.9 g/dL (ref 6.5–8.1)

## 2017-08-31 LAB — CBC WITH DIFFERENTIAL/PLATELET
BASOS PCT: 0 %
Basophils Absolute: 0 10*3/uL (ref 0–0.1)
EOS ABS: 0 10*3/uL (ref 0–0.7)
Eosinophils Relative: 0 %
HCT: 32.2 % — ABNORMAL LOW (ref 35.0–47.0)
HEMOGLOBIN: 10.4 g/dL — AB (ref 12.0–16.0)
LYMPHS ABS: 0.7 10*3/uL — AB (ref 1.0–3.6)
Lymphocytes Relative: 8 %
MCH: 31.6 pg (ref 26.0–34.0)
MCHC: 32.3 g/dL (ref 32.0–36.0)
MCV: 97.8 fL (ref 80.0–100.0)
Monocytes Absolute: 0.6 10*3/uL (ref 0.2–0.9)
Monocytes Relative: 6 %
NEUTROS ABS: 7.6 10*3/uL — AB (ref 1.4–6.5)
NEUTROS PCT: 86 %
Platelets: 343 10*3/uL (ref 150–440)
RBC: 3.3 MIL/uL — AB (ref 3.80–5.20)
RDW: 17.5 % — ABNORMAL HIGH (ref 11.5–14.5)
WBC: 9 10*3/uL (ref 3.6–11.0)

## 2017-08-31 LAB — TYPE AND SCREEN
ABO/RH(D): A NEG
Antibody Screen: NEGATIVE

## 2017-08-31 LAB — GLUCOSE, CAPILLARY
GLUCOSE-CAPILLARY: 287 mg/dL — AB (ref 65–99)
GLUCOSE-CAPILLARY: 109 mg/dL — AB (ref 65–99)

## 2017-08-31 LAB — TROPONIN I: Troponin I: 0.05 ng/mL (ref <0.03)

## 2017-08-31 LAB — CK: Total CK: 171 U/L (ref 38–234)

## 2017-08-31 MED ORDER — IPRATROPIUM-ALBUTEROL 0.5-2.5 (3) MG/3ML IN SOLN
3.0000 mL | Freq: Once | RESPIRATORY_TRACT | Status: AC
  Administered 2017-08-31: 3 mL via RESPIRATORY_TRACT
  Filled 2017-08-31: qty 3

## 2017-08-31 MED ORDER — SODIUM BICARBONATE 8.4 % IV SOLN
50.0000 meq | Freq: Once | INTRAVENOUS | Status: AC
  Administered 2017-08-31: 50 meq via INTRAVENOUS

## 2017-08-31 MED ORDER — SODIUM CHLORIDE 0.9 % IV BOLUS (SEPSIS)
1000.0000 mL | Freq: Once | INTRAVENOUS | Status: AC
  Administered 2017-08-31: 1000 mL via INTRAVENOUS

## 2017-08-31 MED ORDER — CALCIUM CHLORIDE 10 % IV SOLN
1.0000 g | Freq: Once | INTRAVENOUS | Status: AC
  Administered 2017-08-31: 1 g via INTRAVENOUS

## 2017-08-31 MED ORDER — SODIUM CHLORIDE 0.9 % IV SOLN
1.0000 g | Freq: Once | INTRAVENOUS | Status: DC
  Filled 2017-08-31: qty 10

## 2017-08-31 MED ORDER — DEXTROSE 50 % IV SOLN
50.0000 mL | Freq: Once | INTRAVENOUS | Status: AC
  Administered 2017-08-31: 50 mL via INTRAVENOUS
  Filled 2017-08-31: qty 50

## 2017-08-31 MED ORDER — INSULIN ASPART 100 UNIT/ML ~~LOC~~ SOLN
10.0000 [IU] | Freq: Once | SUBCUTANEOUS | Status: AC
  Administered 2017-08-31: 10 [IU] via INTRAVENOUS
  Filled 2017-08-31: qty 1

## 2017-08-31 NOTE — ED Notes (Addendum)
1945 bair hugger applied  Blood occult per rectum is positive, no frank bleeding visualized

## 2017-08-31 NOTE — ED Notes (Addendum)
Son, Roxy Manns, leaving bedside, will return an approx 1 hour, contact cell [phone]

## 2017-08-31 NOTE — ED Notes (Signed)
Pt returned from CT/XR att, bair hugger reapplied

## 2017-08-31 NOTE — ED Notes (Signed)
Patient stated that she needed to use the restroom. External catheter applied

## 2017-08-31 NOTE — ED Triage Notes (Signed)
Patient presents to Emergency Department via [facility] from home with complaints of fall downstairs.  Per EMS fall of 5-8 feet.   Multiple stages of bruising, denies blood thinners, bilateral knee and shin avulsions - bandaged.

## 2017-08-31 NOTE — ED Notes (Signed)
Pharm called

## 2017-08-31 NOTE — ED Notes (Signed)
Son called, pt to room att from waiting room, Dr Archie Balboa updated family on POC

## 2017-08-31 NOTE — Consult Note (Addendum)
Date: 08/31/2017                  Patient Name:  Lindsey Hobbs  MRN: [id number]  DOB: 13-Mar-1927  Age / Sex: 82 y.o., female         PCP: Cletis Athens, MD                 Service Requesting Consult: ER/ Nance Pear, MD                 Reason for Consult: Hyperkalemia, ARF            History of Present Illness: Patient is a 83 y.o. female with medical problems of CHF (EF 35-40% from 06/2017),severe TR and MR, COPD, crohn's disease, DVT, HLD, HTN, h/o Hansel Feinstein who was admitted to [facility] on 08/31/2017 for evaluation post fall She presents to ER via EMS from home after a fall.  Baseline Cr  0.93 from 06/2017 Presenting Cr 3.6; Potassium criticaly high at 8.3 with EKG changes Calcium high at 10.3 Home meds include Vasotec, spironolactone, calcium supplements Patient is very lethargic and is able to provide only limited information.  She states that she fell down this evening at about 6.30.  She fell about 7 steps.  She has a med alert.  Her neighbors called patient's son. Patient denies any fevers, chills, dysuria, cough, productive sputum over the last few days.  She states she has been able to eat and drink normally.  Medications: Outpatient medications:  (Not in a hospital admission)  Current medications: No current facility-administered medications for this encounter.    Current Outpatient Prescriptions  Medication Sig Dispense Refill  . aspirin EC 81 MG tablet Take 1 tablet (81 mg total) by mouth daily. 150 tablet 2  . atorvastatin (LIPITOR) 10 MG tablet Take 1 tablet (10 mg total) by mouth daily. 30 tablet 11  . budesonide (PULMICORT) 0.25 MG/2ML nebulizer solution Take 2 mLs (0.25 mg total) by nebulization 2 (two) times daily. 60 mL 12  . Calcium Carbonate-Vitamin D (CALCIUM 600+D) 600-400 MG-UNIT per tablet Take 1 tablet by mouth 2 (two) times daily.    . carvedilol (COREG) 3.125 MG tablet Take 1 tablet (3.125 mg total) by mouth 2 (two) times daily with a meal. 60  tablet 0  . clopidogrel (PLAVIX) 75 MG tablet Take 1 tablet (75 mg total) by mouth daily. 30 tablet 11  . enalapril (VASOTEC) 2.5 MG tablet Take 1 tablet (2.5 mg total) by mouth daily. 30 tablet 0  . ferrous sulfate 325 (65 FE) MG tablet Take 1 tablet (325 mg total) by mouth 2 (two) times daily with a meal. 180 tablet 0  . fexofenadine (ALLEGRA) 180 MG tablet Take 180 mg by mouth at bedtime.    . folic acid (FOLVITE) 1 MG tablet Take 1 mg by mouth daily.    . furosemide (LASIX) 20 MG tablet Take 1 tablet (20 mg total) by mouth 2 (two) times daily. 60 tablet 1  . gabapentin (NEURONTIN) 100 MG capsule Take 100-300 mg by mouth See admin instructions. One in the morning and 2 at night    . guaiFENesin (MUCINEX) 600 MG 12 hr tablet Take 1 tablet (600 mg total) by mouth 2 (two) times daily. (Patient taking differently: Take 600 mg by mouth 2 (two) times daily as needed. ) 20 tablet 0  . ipratropium-albuterol (DUONEB) 0.5-2.5 (3) MG/3ML SOLN Take 3 mLs by nebulization every 6 (six) hours as needed. 360 mL  0  . loratadine (CLARITIN) 10 MG tablet Take 10 mg by mouth daily as needed for allergies.    . mesalamine (LIALDA) 1.2 g EC tablet Take 1.2 g by mouth daily with breakfast.     . Omega-3 Fatty Acids (FISH OIL PO) Take 1 capsule by mouth 3 (three) times daily.    . pantoprazole (PROTONIX) 40 MG tablet Take 40 mg by mouth daily.    [name] spironolactone (ALDACTONE) 25 MG tablet Take 1 tablet (25 mg total) by mouth daily. 30 tablet 11  . zolpidem (AMBIEN) 10 MG tablet Take 10 mg by mouth at bedtime as needed for sleep.        Allergies: No Known Allergies    Past Medical History: Past Medical History:  Diagnosis Date  . Anemia   . Avascular necrosis of bone of hip ([place])   . CHF (congestive heart failure) ([place])   . COPD (chronic obstructive pulmonary disease) ([place])   . Crohn's disease ([place])   . DVT (deep venous thrombosis) ([place]) 06/2017  . Guillain Barr syndrome ([place])   . Hyperlipidemia   .  Hypertension      Past Surgical History: Past Surgical History:  Procedure Laterality Date  . ABDOMINAL HYSTERECTOMY    . COLONOSCOPY WITH PROPOFOL Left 07/07/2015   Procedure: COLONOSCOPY WITH PROPOFOL;  Surgeon: Hulen Luster, MD;  Location: [facility] ENDOSCOPY;  Service: Endoscopy;  Laterality: Left;  . FLEXIBLE SIGMOIDOSCOPY N/A 06/05/2017   Procedure: FLEXIBLE SIGMOIDOSCOPY;  Surgeon: Jonathon Bellows, MD;  Location: [facility] ENDOSCOPY;  Service: Gastroenterology;  Laterality: N/A;  . JOINT REPLACEMENT    . LOWER EXTREMITY ANGIOGRAPHY Left 08/08/2017   Procedure: Lower Extremity Angiography;  Surgeon: Algernon Huxley, MD;  Location: [facility] LAB;  Service: Cardiovascular;  Laterality: Left;  . TOTAL HIP ARTHROPLASTY Left ~11 yrs ago     Family History: Family History  Problem Relation Age of Onset  . Breast cancer Mother      Social History: Social History   Social History  . Marital status: Widowed    Spouse name: N/A  . Number of children: N/A  . Years of education: N/A   Occupational History  . Not on file.   Social History Main Topics  . Smoking status: Current Every Day Smoker    Packs/day: 0.50    Years: 67.00  . Smokeless tobacco: Never Used  . Alcohol use No  . Drug use: No  . Sexual activity: Not on file   Other Topics Concern  . Not on file   Social History Narrative  . No narrative on file     Review of Systems: Limited review of systems as in HPI. Gen:  HEENT:  CV:  Resp:  GI: GU :  MS:  Derm:   Psych: Heme:  Neuro:  Endocrine  Vital Signs: Blood pressure (!) 87/59, pulse 77, temperature (!) 93 F (33.9 C), temperature source Rectal, resp. rate 20, height 5\' 4"  (1.626 m), weight 54.4 kg (120 lb), SpO2 94 %.   Intake/Output Summary (Last 24 hours) at 08/31/17 2206 Last data filed at 08/31/17 2100  Gross per 24 hour  Intake             1001 ml  Output                0 ml  Net             1001 ml    Weight trends: [employer]  08/31/17 1927  Weight: 54.4 kg (120 lb)    Physical Exam: General:  Frail, elderly, laying in the bed on  HEENT  mouth is dry,   Neck:  supple  Lungs:  Normal breathing effort, decreased breath sounds at bases, no crackles  Heart::  regular, now sinus  Abdomen: soft, nontender, nondistended  Extremities:  No peripheral edema  Neurologic:  Lethargic but able to answer a few questions   Skin:  bruise on forehead             Lab results: Basic Metabolic Panel:  Recent Labs Lab 08/31/17 1931  NA 135  K 8.3*  CL 109  CO2 17*  GLUCOSE 134*  BUN 123*  CREATININE 3.59*  CALCIUM 10.3    Liver Function Tests:  Recent Labs Lab 08/31/17 1931  AST 26  ALT 8*  ALKPHOS 61  BILITOT 0.7  PROT 6.9  ALBUMIN 3.1*   No results for input(s): LIPASE, AMYLASE in the last 168 hours. No results for input(s): AMMONIA in the last 168 hours.  CBC:  Recent Labs Lab 08/31/17 1931  WBC 9.0  NEUTROABS 7.6*  HGB 10.4*  HCT 32.2*  MCV 97.8  PLT 343    Cardiac Enzymes:  Recent Labs Lab 08/31/17 1931  CKTOTAL 171  TROPONINI 0.05*    BNP: Invalid input(s): POCBNP  CBG:  Recent Labs Lab 08/31/17 2152  GLUCAP 287*    Microbiology: No results found for this or any previous visit (from the past 720 hour(s)).   Coagulation Studies: No results for input(s): LABPROT, INR in the last 72 hours.  Urinalysis:  Recent Labs  08/31/17 1931  [place] 1.019  PHURINE 5.0  GLUCOSEU NEGATIVE  HGBUR NEGATIVE  BILIRUBINUR NEGATIVE  KETONESUR NEGATIVE  PROTEINUR NEGATIVE  NITRITE NEGATIVE  LEUKOCYTESUR NEGATIVE        Imaging: Ct Head Wo Contrast  Result Date: 08/31/2017 CLINICAL DATA:  [place] downstairs EXAM: CT HEAD WITHOUT CONTRAST CT CERVICAL SPINE WITHOUT CONTRAST TECHNIQUE: Multidetector CT imaging of the head and cervical spine was performed following the standard protocol without intravenous contrast. Multiplanar CT image reconstructions of  the cervical spine were also generated. COMPARISON:  08/30/2009 FINDINGS: CT HEAD FINDINGS Brain: No acute territorial infarction, hemorrhage or intracranial mass is visualized. Mild atrophy. Mild small vessel ischemic changes of the white matter. Prominent ventricles felt related to atrophy Vascular: No hyperdense vessels.  Carotid artery calcifications. Skull: No fracture or suspicious lesion Sinuses/Orbits: No acute finding. Other: None CT CERVICAL SPINE FINDINGS Alignment: 4 mm anterolisthesis of C3 on C4, appears increased compared with radiographs from 2010. Trace anterolisthesis of C7 on T1 similar compared to prior. Facet alignment is maintained. Skull base and vertebrae: Craniovertebral junction is intact. There is no fracture. Soft tissues and spinal canal: No prevertebral fluid or swelling. No visible canal hematoma. Disc levels: Marked diffuse degenerative changes from C3 through C7 with disc space narrowing, endplate irregularity and osteophytes. Posterior disc osteophyte complex at C4-C5 and C5-C6. Upper chest: Mild apical emphysema.  Carotid artery calcification. Other: None IMPRESSION: 1. No CT evidence for acute intracranial abnormality. Atrophy and small vessel ischemic changes of the white matter 2. 4 mm anterolisthesis of C3 and C4, increased compared to prior radiographs from 2010. If ligamentous injury is a concern, further evaluation with MRI would be recommended. No fracture is seen. Electronically Signed   By: Donavan Foil M.D.   On: 08/31/2017 20:25   Ct Cervical Spine Wo Contrast  Result  Date: 08/31/2017 CLINICAL DATA:  [place] downstairs EXAM: CT HEAD WITHOUT CONTRAST CT CERVICAL SPINE WITHOUT CONTRAST TECHNIQUE: Multidetector CT imaging of the head and cervical spine was performed following the standard protocol without intravenous contrast. Multiplanar CT image reconstructions of the cervical spine were also generated. COMPARISON:  08/30/2009 FINDINGS: CT HEAD FINDINGS Brain: No  acute territorial infarction, hemorrhage or intracranial mass is visualized. Mild atrophy. Mild small vessel ischemic changes of the white matter. Prominent ventricles felt related to atrophy Vascular: No hyperdense vessels.  Carotid artery calcifications. Skull: No fracture or suspicious lesion Sinuses/Orbits: No acute finding. Other: None CT CERVICAL SPINE FINDINGS Alignment: 4 mm anterolisthesis of C3 on C4, appears increased compared with radiographs from 2010. Trace anterolisthesis of C7 on T1 similar compared to prior. Facet alignment is maintained. Skull base and vertebrae: Craniovertebral junction is intact. There is no fracture. Soft tissues and spinal canal: No prevertebral fluid or swelling. No visible canal hematoma. Disc levels: Marked diffuse degenerative changes from C3 through C7 with disc space narrowing, endplate irregularity and osteophytes. Posterior disc osteophyte complex at C4-C5 and C5-C6. Upper chest: Mild apical emphysema.  Carotid artery calcification. Other: None IMPRESSION: 1. No CT evidence for acute intracranial abnormality. Atrophy and small vessel ischemic changes of the white matter 2. 4 mm anterolisthesis of C3 and C4, increased compared to prior radiographs from 2010. If ligamentous injury is a concern, further evaluation with MRI would be recommended. No fracture is seen. Electronically Signed   By: Donavan Foil M.D.   On: 08/31/2017 20:25   Dg Hip Unilat W Or Wo Pelvis 2-3 Views Left  Result Date: 08/31/2017 CLINICAL DATA:  83 year old female with fall and left hip pain. EXAM: DG HIP (WITH OR WITHOUT PELVIS) 2-3V LEFT COMPARISON:  None. FINDINGS: There is a total left hip arthroplasty. The orthopedic hardware appears intact and in anatomic alignment. There is no lucency surrounding the hardware to suggest loosening. There is no acute fracture or dislocation. The bones are osteopenic. There are degenerative changes of the lower lumbar spine. Vascular calcification and a  right iliac vascular stent noted. The soft tissues appear unremarkable. IMPRESSION: 1. No acute fracture or dislocation. 2. Left hip arthroplasty appears intact. Electronically Signed   By: Anner Crete M.D.   On: 08/31/2017 20:22      Assessment & Plan: Pt is a 83 y.o. Caucasian  female with medical problems of CHF (EF 35-40% from 06/2017),severe TR and MR, COPD, crohn's disease, DVT, HLD, HTN, h/o Hansel Feinstein who was admitted to [facility] on 08/31/2017 for evaluation post fall  1.  Acute renal failure, oliguric 2.  Severe hyperkalemia, with EKG changes 3.  Severe hypotension and dehydration 4.  Hypercalcemia  Patient clinically appears severely dehydrated.  She probably has a component of dehydration leading to ATN Baseline creatinine is 0.93 from August.  In early October, it was noted to have increased to 1.55 Today, admitting creatinine is 3.59, BUN of 123; Potassium is elevated critically at 8.3 Patient has received 2 L of normal saline so far.  A repeat comprehensive metabolic panel has been drawn.  Patient is getting ready to get third liter of IV normal saline.  Despite aggressive volume resuscitation, she is still hypotensive  Plan: Discussed case with ER, internal medicine, patient's son, ICU team.  Patient will need urgent dialysis to correct potassium.  She has already received shifting measures.  Urgent need for dialysis was discussed with patient's son.  He has consented to proceed. ICU team will  place a dialysis catheter.  Case discussed with ICU nurse practitioner Dialysis nurse has been alerted; dialysis orders placed. Patient will be evaluated on a daily basis for further need of dialysis Discontinue Vasotec, spironolactone and calcium supplements Renal ultrasound when more hemodynamically stable We will continue to follow. Critical care 9.40 pm - 10.52 pm

## 2017-08-31 NOTE — ED Notes (Signed)
Lab called for critical labs, no hemolysis, potassium greater than 8, BUN at 123, Dr Archie Balboa notified

## 2017-08-31 NOTE — ED Provider Notes (Signed)
[facility] Emergency Department Provider Note   ____________________________________________   I have reviewed the triage vital signs and the nursing notes.   HISTORY  Chief Complaint Fall   History limited by: Not Limited   HPI Lindsey Hobbs is a 83 y.o. female who presents to the emergency department today via EMS after a fall and subsequent pain.  LOCATION:headache, left hip pain DURATION:happened today SEVERITY: severe  CONTEXT: patient was at her house. States that she slipped going down the stairs. Apparently was able to use her life alert to call EMS. MODIFYING FACTORS: pain is worse with movement ASSOCIATED SYMPTOMS: denies any chest pain, denies any fevers  Per medical record review patient has a history of CHF, COPD, avascular necrosis of hip with left hip replacement.  Past Medical History:  Diagnosis Date  . Anemia   . Avascular necrosis of bone of hip ([place])   . CHF (congestive heart failure) ([place])   . COPD (chronic obstructive pulmonary disease) ([place])   . Crohn's disease ([place])   . DVT (deep venous thrombosis) ([place]) 06/2017  . Guillain Barr syndrome ([place])   . Hyperlipidemia   . Hypertension     Patient Active Problem List   Diagnosis Date Noted  . Hyperlipidemia 08/02/2017  . Atherosclerosis of native arteries of the extremities with ulceration ([place]) 08/02/2017  . Chronic systolic heart failure ([place]) 07/04/2017  . HTN (hypertension) 07/04/2017  . Tobacco use 07/04/2017  . COPD exacerbation ([place])   . Palliative care by specialist   . Goals of care, counseling/discussion   . Chronic deep vein thrombosis (DVT) of right lower extremity ([place]) 06/18/2017  . Dyspnea 06/18/2017  . Demand ischemia ([place]) 06/18/2017  . Tobacco abuse counseling 06/18/2017  . Right leg DVT ([place]) 06/18/2017  . Ulcerative colitis ([place])   . Anemia 06/04/2017  . Rectal bleed 06/02/2017  . GI bleed 07/05/2015    Past Surgical History:  Procedure  Laterality Date  . ABDOMINAL HYSTERECTOMY    . COLONOSCOPY WITH PROPOFOL Left 07/07/2015   Procedure: COLONOSCOPY WITH PROPOFOL;  Surgeon: Hulen Luster, MD;  Location: [facility] ENDOSCOPY;  Service: Endoscopy;  Laterality: Left;  . FLEXIBLE SIGMOIDOSCOPY N/A 06/05/2017   Procedure: FLEXIBLE SIGMOIDOSCOPY;  Surgeon: Jonathon Bellows, MD;  Location: [facility] ENDOSCOPY;  Service: Gastroenterology;  Laterality: N/A;  . JOINT REPLACEMENT    . LOWER EXTREMITY ANGIOGRAPHY Left 08/08/2017   Procedure: Lower Extremity Angiography;  Surgeon: Algernon Huxley, MD;  Location: [place] CV LAB;  Service: Cardiovascular;  Laterality: Left;  . TOTAL HIP ARTHROPLASTY Left ~11 yrs ago    Prior to Admission medications   Medication Sig Start Date End Date Taking? Authorizing Provider  aspirin EC 81 MG tablet Take 1 tablet (81 mg total) by mouth daily. 08/08/17   Algernon Huxley, MD  atorvastatin (LIPITOR) 10 MG tablet Take 1 tablet (10 mg total) by mouth daily. 08/08/17 08/08/18  Algernon Huxley, MD  budesonide (PULMICORT) 0.25 MG/2ML nebulizer solution Take 2 mLs (0.25 mg total) by nebulization 2 (two) times daily. 06/24/17   Epifanio Lesches, MD  Calcium Carbonate-Vitamin D (CALCIUM 600+D) 600-400 MG-UNIT per tablet Take 1 tablet by mouth 2 (two) times daily.    [provider]  carvedilol (COREG) 3.125 MG tablet Take 1 tablet (3.125 mg total) by mouth 2 (two) times daily with a meal. 06/24/17   Epifanio Lesches, MD  clopidogrel (PLAVIX) 75 MG tablet Take 1 tablet (75 mg total) by mouth daily. 08/08/17  Algernon Huxley, MD  enalapril (VASOTEC) 2.5 MG tablet Take 1 tablet (2.5 mg total) by mouth daily. 06/25/17   Epifanio Lesches, MD  ferrous sulfate 325 (65 FE) MG tablet Take 1 tablet (325 mg total) by mouth 2 (two) times daily with a meal. 07/08/15   Sudini, Alveta Heimlich, MD  fexofenadine (ALLEGRA) 180 MG tablet Take 180 mg by mouth at bedtime.    [provider]  folic acid (FOLVITE) 1 MG tablet Take 1 mg by mouth daily.     [provider]  furosemide (LASIX) 20 MG tablet Take 1 tablet (20 mg total) by mouth 2 (two) times daily. 06/24/17 06/24/18  Epifanio Lesches, MD  gabapentin (NEURONTIN) 100 MG capsule Take 100-300 mg by mouth See admin instructions. One in the morning and 2 at night    [provider]  guaiFENesin (MUCINEX) 600 MG 12 hr tablet Take 1 tablet (600 mg total) by mouth 2 (two) times daily. Patient taking differently: Take 600 mg by mouth 2 (two) times daily as needed.  06/06/17   Gladstone Lighter, MD  ipratropium-albuterol (DUONEB) 0.5-2.5 (3) MG/3ML SOLN Take 3 mLs by nebulization every 6 (six) hours as needed. 06/06/17   Gladstone Lighter, MD  loratadine (CLARITIN) 10 MG tablet Take 10 mg by mouth daily as needed for allergies.    [provider]  mesalamine (LIALDA) 1.2 g EC tablet Take 1.2 g by mouth daily with breakfast.     [provider]  Omega-3 Fatty Acids (FISH OIL PO) Take 1 capsule by mouth 3 (three) times daily.    [provider]  pantoprazole (PROTONIX) 40 MG tablet Take 40 mg by mouth daily. 05/13/17   [provider]  spironolactone (ALDACTONE) 25 MG tablet Take 1 tablet (25 mg total) by mouth daily. 06/24/17 06/24/18  Epifanio Lesches, MD  zolpidem (AMBIEN) 10 MG tablet Take 10 mg by mouth at bedtime as needed for sleep.    [provider]    Allergies Patient has no known allergies.  Family History  Problem Relation Age of Onset  . Breast cancer Mother     Social History Social History  Substance Use Topics  . Smoking status: Current Every Day Smoker    Packs/day: 0.50    Years: 67.00  . Smokeless tobacco: Never Used  . Alcohol use No    Review of Systems Constitutional: No fever/chills Eyes: No visual changes. ENT: No sore throat. Cardiovascular: Denies chest pain. Respiratory: Denies shortness of breath. Gastrointestinal: No abdominal pain.  No nausea, no vomiting.  No diarrhea.    Genitourinary: Negative for dysuria. Musculoskeletal: Positive for left hip pain. Skin: Positive for bruising Neurological: Positive for headache.  ____________________________________________   PHYSICAL EXAM:  VITAL SIGNS: ED Triage Vitals  Enc Vitals Group     BP 08/31/17 1922 (!) 82/39     Pulse Rate 08/31/17 1922 60     Resp 08/31/17 1922 19     Temp 08/31/17 1942 (!) 93 F (33.9 C)     Temp Source 08/31/17 1942 Rectal     SpO2 08/31/17 1917 95 %     Weight 08/31/17 1927 120 lb (54.4 kg)     Height 08/31/17 1927 5\' 4"  (1.626 m)     Head Circumference --      Peak Flow --      Pain Score 08/31/17 1921 8   Constitutional: Alert and oriented.  Eyes: Conjunctivae are normal.  ENT   Head: Normocephalic and atraumatic.  Nose: No congestion/rhinnorhea.   Mouth/Throat: Mucous membranes are moist.   Neck: No stridor. No midline tenderness. In c-collar. Hematological/Lymphatic/Immunilogical: No cervical lymphadenopathy. Cardiovascular: Normal rate, regular rhythm.  No murmurs, rubs, or gallops. Respiratory: Normal respiratory effort without tachypnea nor retractions. Breath sounds are clear and equal bilaterally. No wheezes/rales/rhonchi. Gastrointestinal: Soft and non tender. No rebound. No guarding.  Genitourinary: Deferred Musculoskeletal: Normal range of motion in all extremities. Left hip with tenderness with manipulation and movement. No spinal tenderness. Neurologic:  Normal speech and language. No gross focal neurologic deficits are appreciated.  Skin:  Skin is warm, dry and intact. No rash noted. Psychiatric: Mood and affect are normal. Speech and behavior are normal. Patient exhibits appropriate insight and judgment.  ____________________________________________    LABS (pertinent positives/negatives)  K>7.5 Cr 3.59 BUN 123  CBC WBC 9.0 hgb 10.4  UA not consistent with infection  Trop  0.05 ____________________________________________   EKG  I, Nance Pear, attending physician, personally viewed and interpreted this EKG  EKG Time: 1924 Rate: 59 Rhythm: sinus rhythm Axis: normal Intervals: qtc 425 QRS: wide ST changes: peaked t waves Impression: abnormal ekg  I, Nance Pear, attending physician, personally viewed and interpreted this EKG  EKG Time: 2150 Rate: 76 Rhythm: sinus rhythm Axis: normal Intervals: qtc 428 QRS: still wide, but improving ST changes: t wave not as peaked Impression: abnromal ekg   ____________________________________________    RADIOLOGY  CT head  No acute findings  CT cervical spine anterolisthesis of C3 and C4  Left hip No acute fracture  I, Mirtie Bastyr, personally viewed and evaluated these images (plain radiographs) as part of my medical decision making. ____________________________________________   PROCEDURES  Procedures  CRITICAL CARE Performed by: Nance Pear   Total critical care time: 45 minutes  Critical care time was exclusive of separately billable procedures and treating other patients.  Critical care was necessary to treat or prevent imminent or life-threatening deterioration.  Critical care was time spent personally by me on the following activities: development of treatment plan with patient and/or surrogate as well as nursing, discussions with consultants, evaluation of patient's response to treatment, examination of patient, obtaining history from patient or surrogate, ordering and performing treatments and interventions, ordering and review of laboratory studies, ordering and review of radiographic studies, pulse oximetry and re-evaluation of patient's condition.  ____________________________________________   INITIAL IMPRESSION / ASSESSMENT AND PLAN / ED COURSE  Pertinent labs & imaging results that were available during my care of the patient were reviewed by me and  considered in my medical decision making (see chart for details).  Patient presented to the emergency department today after a fall.  Patient states that she slipped and fell down stairs.  She does have bruising on many surfaces of her body consistent with injuries of differing ages.  Complaining today primarily of head and left hip injury.  Imaging was done to evaluate for intracranial bleed, fractures.  No midline C-spine tenderness.  I doubt ligamental injury.  When I was speaking to the son however he felt like she was slightly less responsive than normal.  This did prompt blood work.  Blood work did show hyperkalemia which was consistent with EKG.  Given level of hyperkalemia multiple drugs were started to help bring down her potassium levels.  This did help narrow her QRS complex and resolve the PTs.  Dr. Candiss Norse with nephrology was consulted for emergent dialysis.  He did come to the emergency department to evaluate the patient and help  set up emergent dialysis.  Additionally patient was admitted to the hospitalist service.  I discussed these findings and the plan with the patient and her family.    ____________________________________________   FINAL CLINICAL IMPRESSION(S) / ED DIAGNOSES  Final diagnoses:  Fall, initial encounter  Hyperkalemia  Elevated troponin  AKI (acute kidney injury) ([place])     Note: This dictation was prepared with Dragon dictation. Any transcriptional errors that result from this process are unintentional     Nance Pear, MD 09/01/17 1534

## 2017-08-31 NOTE — ED Notes (Signed)
Patient transported to CT 

## 2017-08-31 NOTE — ED Notes (Signed)
CRITICAL LAB: TROPONIN is 0.05, [facility] Lab, Dr. Archie Balboa notified, orders received

## 2017-09-01 ENCOUNTER — Inpatient Hospital Stay: Payer: Medicare Other

## 2017-09-01 DIAGNOSIS — N170 Acute kidney failure with tubular necrosis: Secondary | ICD-10-CM

## 2017-09-01 DIAGNOSIS — E875 Hyperkalemia: Secondary | ICD-10-CM

## 2017-09-01 DIAGNOSIS — N179 Acute kidney failure, unspecified: Principal | ICD-10-CM

## 2017-09-01 DIAGNOSIS — M549 Dorsalgia, unspecified: Secondary | ICD-10-CM

## 2017-09-01 DIAGNOSIS — W19XXXA Unspecified fall, initial encounter: Secondary | ICD-10-CM

## 2017-09-01 DIAGNOSIS — M5440 Lumbago with sciatica, unspecified side: Secondary | ICD-10-CM

## 2017-09-01 LAB — GLUCOSE, CAPILLARY
GLUCOSE-CAPILLARY: 29 mg/dL — AB (ref 65–99)
Glucose-Capillary: 106 mg/dL — ABNORMAL HIGH (ref 65–99)
Glucose-Capillary: 108 mg/dL — ABNORMAL HIGH (ref 65–99)
Glucose-Capillary: 123 mg/dL — ABNORMAL HIGH (ref 65–99)
Glucose-Capillary: 124 mg/dL — ABNORMAL HIGH (ref 65–99)
Glucose-Capillary: 211 mg/dL — ABNORMAL HIGH (ref 65–99)
Glucose-Capillary: 98 mg/dL (ref 65–99)

## 2017-09-01 LAB — BASIC METABOLIC PANEL
ANION GAP: 4 — AB (ref 5–15)
BUN: 98 mg/dL — ABNORMAL HIGH (ref 6–20)
CHLORIDE: 117 mmol/L — AB (ref 101–111)
CO2: 18 mmol/L — AB (ref 22–32)
Calcium: 8.9 mg/dL (ref 8.9–10.3)
Creatinine, Ser: 2.29 mg/dL — ABNORMAL HIGH (ref 0.44–1.00)
GFR calc Af Amer: 21 mL/min — ABNORMAL LOW (ref >60)
GFR calc non Af Amer: 18 mL/min — ABNORMAL LOW (ref >60)
GLUCOSE: 127 mg/dL — AB (ref 65–99)
POTASSIUM: 7.4 mmol/L — AB (ref 3.5–5.1)
Sodium: 139 mmol/L (ref 135–145)

## 2017-09-01 LAB — CBC
HEMATOCRIT: 25.1 % — AB (ref 35.0–47.0)
Hemoglobin: 8.1 g/dL — ABNORMAL LOW (ref 12.0–16.0)
MCH: 31.4 pg (ref 26.0–34.0)
MCHC: 32.1 g/dL (ref 32.0–36.0)
MCV: 97.7 fL (ref 80.0–100.0)
Platelets: 247 10*3/uL (ref 150–440)
RBC: 2.57 MIL/uL — AB (ref 3.80–5.20)
RDW: 17 % — ABNORMAL HIGH (ref 11.5–14.5)
WBC: 9.3 10*3/uL (ref 3.6–11.0)

## 2017-09-01 LAB — POTASSIUM
POTASSIUM: 4.4 mmol/L (ref 3.5–5.1)
Potassium: 6 mmol/L — ABNORMAL HIGH (ref 3.5–5.1)

## 2017-09-01 LAB — MRSA PCR SCREENING: MRSA by PCR: NEGATIVE

## 2017-09-01 MED ORDER — MESALAMINE 1.2 G PO TBEC
2.4000 g | DELAYED_RELEASE_TABLET | Freq: Every day | ORAL | Status: DC
  Administered 2017-09-01 – 2017-09-04 (×4): 2.4 g via ORAL
  Filled 2017-09-01 (×4): qty 2

## 2017-09-01 MED ORDER — ADULT MULTIVITAMIN W/MINERALS CH
1.0000 | ORAL_TABLET | Freq: Every day | ORAL | Status: DC
  Administered 2017-09-02 – 2017-09-04 (×3): 1 via ORAL
  Filled 2017-09-01 (×3): qty 1

## 2017-09-01 MED ORDER — SODIUM CHLORIDE 0.9 % IV BOLUS (SEPSIS)
1000.0000 mL | Freq: Once | INTRAVENOUS | Status: AC
  Administered 2017-09-01: 1000 mL via INTRAVENOUS

## 2017-09-01 MED ORDER — ACETAMINOPHEN 650 MG RE SUPP: 650.0000 mg | Freq: Four times a day (QID) | RECTAL | Status: DC | PRN

## 2017-09-01 MED ORDER — CALCIUM CARBONATE-VITAMIN D 600-400 MG-UNIT PO TABS
1.0000 | ORAL_TABLET | Freq: Two times a day (BID) | ORAL | Status: DC
Start: 2017-09-01 — End: 2017-09-01

## 2017-09-01 MED ORDER — OMEGA-3-ACID ETHYL ESTERS 1 G PO CAPS
1.0000 g | ORAL_CAPSULE | Freq: Every day | ORAL | Status: DC
  Administered 2017-09-02 – 2017-09-04 (×3): 1 g via ORAL
  Filled 2017-09-01 (×3): qty 1

## 2017-09-01 MED ORDER — POLYETHYLENE GLYCOL 3350 17 G PO PACK: 17.0000 g | PACK | Freq: Every day | ORAL | Status: DC | PRN

## 2017-09-01 MED ORDER — ASPIRIN EC 81 MG PO TBEC
81.0000 mg | DELAYED_RELEASE_TABLET | Freq: Every day | ORAL | Status: DC
  Administered 2017-09-01 – 2017-09-04 (×4): 81 mg via ORAL
  Filled 2017-09-01 (×4): qty 1

## 2017-09-01 MED ORDER — CALCIUM CARBONATE-VITAMIN D 500-200 MG-UNIT PO TABS
1.0000 | ORAL_TABLET | Freq: Every day | ORAL | Status: DC
  Administered 2017-09-01 – 2017-09-04 (×4): 1 via ORAL
  Filled 2017-09-01 (×4): qty 1

## 2017-09-01 MED ORDER — BUDESONIDE 0.25 MG/2ML IN SUSP
0.2500 mg | Freq: Two times a day (BID) | RESPIRATORY_TRACT | Status: DC
  Administered 2017-09-01 – 2017-09-03 (×5): 0.25 mg via RESPIRATORY_TRACT
  Filled 2017-09-01 (×5): qty 2

## 2017-09-01 MED ORDER — HYDROMORPHONE HCL 1 MG/ML IJ SOLN
0.5000 mg | Freq: Once | INTRAMUSCULAR | Status: AC
  Administered 2017-09-01: 0.5 mg via INTRAVENOUS
  Filled 2017-09-01: qty 1

## 2017-09-01 MED ORDER — ONDANSETRON HCL 4 MG PO TABS: 4.0000 mg | ORAL_TABLET | Freq: Four times a day (QID) | ORAL | Status: DC | PRN

## 2017-09-01 MED ORDER — ORAL CARE MOUTH RINSE
15.0000 mL | Freq: Two times a day (BID) | OROMUCOSAL | Status: DC
  Administered 2017-09-01 – 2017-09-03 (×4): 15 mL via OROMUCOSAL

## 2017-09-01 MED ORDER — SODIUM CHLORIDE 0.9% FLUSH
10.0000 mL | Freq: Two times a day (BID) | INTRAVENOUS | Status: DC
  Administered 2017-09-01 – 2017-09-02 (×4): 10 mL

## 2017-09-01 MED ORDER — FERROUS SULFATE 325 (65 FE) MG PO TABS
325.0000 mg | ORAL_TABLET | Freq: Every day | ORAL | Status: DC
  Administered 2017-09-02 – 2017-09-04 (×3): 325 mg via ORAL
  Filled 2017-09-01 (×3): qty 1

## 2017-09-01 MED ORDER — SODIUM CHLORIDE 0.9 % IV SOLN
0.0000 ug/min | INTRAVENOUS | Status: DC
  Administered 2017-09-01: 50 ug/min via INTRAVENOUS
  Administered 2017-09-01: 40 ug/min via INTRAVENOUS
  Administered 2017-09-01: 70 ug/min via INTRAVENOUS
  Administered 2017-09-01: 80 ug/min via INTRAVENOUS
  Administered 2017-09-01: 50 ug/min via INTRAVENOUS
  Administered 2017-09-01: 45 ug/min via INTRAVENOUS
  Administered 2017-09-01 (×2): 50 ug/min via INTRAVENOUS
  Administered 2017-09-01: 35 ug/min via INTRAVENOUS
  Administered 2017-09-01: 40 ug/min via INTRAVENOUS
  Administered 2017-09-01: 60 ug/min via INTRAVENOUS
  Administered 2017-09-01: 20 ug/min via INTRAVENOUS
  Administered 2017-09-01: 50 ug/min via INTRAVENOUS
  Administered 2017-09-02 (×3): 60 ug/min via INTRAVENOUS
  Filled 2017-09-01: qty 10
  Filled 2017-09-01 (×3): qty 1
  Filled 2017-09-01 (×2): qty 10
  Filled 2017-09-01: qty 1
  Filled 2017-09-01: qty 10
  Filled 2017-09-01 (×2): qty 1
  Filled 2017-09-01: qty 10

## 2017-09-01 MED ORDER — ONDANSETRON HCL 4 MG/2ML IJ SOLN
4.0000 mg | Freq: Four times a day (QID) | INTRAMUSCULAR | Status: DC | PRN
  Administered 2017-09-01: 4 mg via INTRAVENOUS
  Filled 2017-09-01: qty 2

## 2017-09-01 MED ORDER — IPRATROPIUM-ALBUTEROL 0.5-2.5 (3) MG/3ML IN SOLN: 3.0000 mL | Freq: Four times a day (QID) | RESPIRATORY_TRACT | Status: DC | PRN

## 2017-09-01 MED ORDER — SODIUM CHLORIDE 0.9% FLUSH
3.0000 mL | Freq: Two times a day (BID) | INTRAVENOUS | Status: DC
  Administered 2017-09-01 – 2017-09-04 (×9): 3 mL via INTRAVENOUS

## 2017-09-01 MED ORDER — LORATADINE 10 MG PO TABS: 10.0000 mg | ORAL_TABLET | Freq: Every day | ORAL | Status: DC | PRN

## 2017-09-01 MED ORDER — SODIUM CHLORIDE 0.9% FLUSH: 10.0000 mL | INTRAVENOUS | Status: DC | PRN

## 2017-09-01 MED ORDER — PANTOPRAZOLE SODIUM 40 MG PO TBEC
40.0000 mg | DELAYED_RELEASE_TABLET | Freq: Every day | ORAL | Status: DC
  Administered 2017-09-01 – 2017-09-04 (×4): 40 mg via ORAL
  Filled 2017-09-01 (×4): qty 1

## 2017-09-01 MED ORDER — ENSURE ENLIVE PO LIQD
237.0000 mL | Freq: Two times a day (BID) | ORAL | Status: DC
  Administered 2017-09-01: 237 mL via ORAL

## 2017-09-01 MED ORDER — HEPARIN SODIUM (PORCINE) 5000 UNIT/ML IJ SOLN
5000.0000 [IU] | Freq: Three times a day (TID) | INTRAMUSCULAR | Status: DC
  Administered 2017-09-01 – 2017-09-04 (×11): 5000 [IU] via SUBCUTANEOUS
  Filled 2017-09-01 (×11): qty 1

## 2017-09-01 MED ORDER — FENTANYL CITRATE (PF) 100 MCG/2ML IJ SOLN
12.5000 ug | INTRAMUSCULAR | Status: DC | PRN
  Administered 2017-09-01: 25 ug via INTRAVENOUS
  Administered 2017-09-01 (×2): 12.5 ug via INTRAVENOUS
  Filled 2017-09-01 (×3): qty 2

## 2017-09-01 MED ORDER — DEXTROSE-NACL 5-0.45 % IV SOLN
INTRAVENOUS | Status: DC
  Administered 2017-09-01: 125 mL/h via INTRAVENOUS
  Administered 2017-09-01 (×2): via INTRAVENOUS
  Administered 2017-09-01: 125 mL/h via INTRAVENOUS
  Administered 2017-09-02 – 2017-09-03 (×4): via INTRAVENOUS

## 2017-09-01 MED ORDER — ATORVASTATIN CALCIUM 10 MG PO TABS
10.0000 mg | ORAL_TABLET | Freq: Every day | ORAL | Status: DC
  Administered 2017-09-01 – 2017-09-03 (×3): 10 mg via ORAL
  Filled 2017-09-01 (×3): qty 1

## 2017-09-01 MED ORDER — GABAPENTIN 100 MG PO CAPS
200.0000 mg | ORAL_CAPSULE | Freq: Two times a day (BID) | ORAL | Status: DC
  Administered 2017-09-01 – 2017-09-04 (×7): 200 mg via ORAL
  Filled 2017-09-01 (×8): qty 2

## 2017-09-01 MED ORDER — PHENYLEPHRINE HCL 10 MG/ML IJ SOLN
0.0000 ug/min | Freq: Once | INTRAVENOUS | Status: DC
  Filled 2017-09-01: qty 1

## 2017-09-01 MED ORDER — ZOLPIDEM TARTRATE 5 MG PO TABS
5.0000 mg | ORAL_TABLET | Freq: Every evening | ORAL | Status: DC | PRN
  Administered 2017-09-01 – 2017-09-03 (×3): 5 mg via ORAL
  Filled 2017-09-01 (×3): qty 1

## 2017-09-01 MED ORDER — FOLIC ACID 1 MG PO TABS
1.0000 mg | ORAL_TABLET | Freq: Every day | ORAL | Status: DC
Start: 2017-09-01 — End: 2017-09-04
  Administered 2017-09-02 – 2017-09-04 (×3): 1 mg via ORAL
  Filled 2017-09-01 (×3): qty 1

## 2017-09-01 MED ORDER — ACETAMINOPHEN 325 MG PO TABS: 650.0000 mg | ORAL_TABLET | Freq: Four times a day (QID) | ORAL | Status: DC | PRN

## 2017-09-01 MED ORDER — CLOPIDOGREL BISULFATE 75 MG PO TABS
75.0000 mg | ORAL_TABLET | Freq: Every day | ORAL | Status: DC
Start: 2017-09-01 — End: 2017-09-04
  Administered 2017-09-01 – 2017-09-04 (×4): 75 mg via ORAL
  Filled 2017-09-01 (×4): qty 1

## 2017-09-01 MED ORDER — IPRATROPIUM-ALBUTEROL 0.5-2.5 (3) MG/3ML IN SOLN
3.0000 mL | Freq: Four times a day (QID) | RESPIRATORY_TRACT | Status: DC
  Administered 2017-09-01 – 2017-09-03 (×6): 3 mL via RESPIRATORY_TRACT
  Filled 2017-09-01 (×7): qty 3

## 2017-09-01 MED ORDER — HYDROCODONE-ACETAMINOPHEN 5-325 MG PO TABS
1.0000 | ORAL_TABLET | Freq: Four times a day (QID) | ORAL | Status: DC | PRN
  Administered 2017-09-01: 1 via ORAL
  Filled 2017-09-01: qty 2
  Filled 2017-09-01: qty 1

## 2017-09-01 MED FILL — Medication: Qty: 1 | Status: AC

## 2017-09-01 NOTE — Progress Notes (Signed)
[place] [facility] [place] NAME: Lindsey Hobbs    MR#:  [id number]  DATE OF BIRTH:  [birth year]  SUBJECTIVE:  Appears very frail.  Patient complains of pain all over.  She is a bit uncomfortable in the bed.  Does not want to eat.  REVIEW OF SYSTEMS:   Review of Systems  Unable to perform ROS: Medical condition   Tolerating Diet:poor Tolerating PT: pending  DRUG ALLERGIES:  No Known Allergies  VITALS:  Blood pressure 125/73, pulse 91, temperature (!) 97 F (36.1 C), resp. rate (!) 25, height 5\' 4"  (1.626 m), weight 54.4 kg (120 lb), SpO2 94 %.  PHYSICAL EXAMINATION:   Physical Exam  GENERAL:  83 y.o.-year-old patient lying in the bed with no acute distress. Thin,cahectic EYES: Pupils equal, round, reactive to light and accommodation. No scleral icterus. Extraocular muscles intact.  HEENT: Head atraumatic, normocephalic.  Oral mucosa dry ,bruise over the right forehead NECK:  Supple, no jugular venous distention. No thyroid enlargement, no tenderness.  LUNGS: Normal breath sounds bilaterally, no wheezing, rales, rhonchi. No use of accessory muscles of respiration.  CARDIOVASCULAR: S1, S2 normal. No murmurs, rubs, or gallops.  ABDOMEN: Soft, nontender, nondistended. Bowel sounds present. No organomegaly or mass.  EXTREMITIES: No cyanosis, clubbing or edema b/l.    NEUROLOGIC: Unable to assess.  No focal weakness.[name]   PSYCHIATRIC:  patient is alert and weak SKIN: No obvious rash, lesion, or ulcer.   LABORATORY PANEL:  CBC  Recent Labs Lab 09/01/17 0315  WBC 9.3  HGB 8.1*  HCT 25.1*  PLT 247    Chemistries   Recent Labs Lab 08/31/17 2220 09/01/17 0315  09/01/17 0525  NA 140 139  --   --   K 6.6* 7.4*  < > 4.4  CL 116* 117*  --   --   CO2 18* 18*  --   --   GLUCOSE 149* 127*  --   --   BUN 115* 98*  --   --   CREATININE 3.11* 2.29*  --   --   CALCIUM 10.2 8.9  --   --   AST 26  --   --   --   ALT 9*  --   --   --    ALKPHOS 40  --   --   --   BILITOT 0.3  --   --   --   < > = values in this interval not displayed. Cardiac Enzymes  Recent Labs Lab 08/31/17 1931  TROPONINI 0.05*   RADIOLOGY:  Ct Head Wo Contrast  Result Date: 08/31/2017 CLINICAL DATA:  [address] downstairs EXAM: CT HEAD WITHOUT CONTRAST CT CERVICAL SPINE WITHOUT CONTRAST TECHNIQUE: Multidetector CT imaging of the head and cervical spine was performed following the standard protocol without intravenous contrast. Multiplanar CT image reconstructions of the cervical spine were also generated. COMPARISON:  08/30/2009 FINDINGS: CT HEAD FINDINGS Brain: No acute territorial infarction, hemorrhage or intracranial mass is visualized. Mild atrophy. Mild small vessel ischemic changes of the white matter. Prominent ventricles felt related to atrophy Vascular: No hyperdense vessels.  Carotid artery calcifications. Skull: No fracture or suspicious lesion Sinuses/Orbits: No acute finding. Other: None CT CERVICAL SPINE FINDINGS Alignment: 4 mm anterolisthesis of C3 on C4, appears increased compared with radiographs from 2010. Trace anterolisthesis of C7 on T1 similar compared to prior. Facet alignment is maintained. Skull base and vertebrae: Craniovertebral junction is intact. There is no fracture. Soft tissues  and spinal canal: No prevertebral fluid or swelling. No visible canal hematoma. Disc levels: Marked diffuse degenerative changes from C3 through C7 with disc space narrowing, endplate irregularity and osteophytes. Posterior disc osteophyte complex at C4-C5 and C5-C6. Upper chest: Mild apical emphysema.  Carotid artery calcification. Other: None IMPRESSION: 1. No CT evidence for acute intracranial abnormality. Atrophy and small vessel ischemic changes of the white matter 2. 4 mm anterolisthesis of C3 and C4, increased compared to prior radiographs from 2010. If ligamentous injury is a concern, further evaluation with MRI would be recommended. No fracture is  seen. Electronically Signed   By: Donavan Foil M.D.   On: 08/31/2017 20:25   Ct Cervical Spine Wo Contrast  Result Date: 08/31/2017 CLINICAL DATA:  [address] downstairs EXAM: CT HEAD WITHOUT CONTRAST CT CERVICAL SPINE WITHOUT CONTRAST TECHNIQUE: Multidetector CT imaging of the head and cervical spine was performed following the standard protocol without intravenous contrast. Multiplanar CT image reconstructions of the cervical spine were also generated. COMPARISON:  08/30/2009 FINDINGS: CT HEAD FINDINGS Brain: No acute territorial infarction, hemorrhage or intracranial mass is visualized. Mild atrophy. Mild small vessel ischemic changes of the white matter. Prominent ventricles felt related to atrophy Vascular: No hyperdense vessels.  Carotid artery calcifications. Skull: No fracture or suspicious lesion Sinuses/Orbits: No acute finding. Other: None CT CERVICAL SPINE FINDINGS Alignment: 4 mm anterolisthesis of C3 on C4, appears increased compared with radiographs from 2010. Trace anterolisthesis of C7 on T1 similar compared to prior. Facet alignment is maintained. Skull base and vertebrae: Craniovertebral junction is intact. There is no fracture. Soft tissues and spinal canal: No prevertebral fluid or swelling. No visible canal hematoma. Disc levels: Marked diffuse degenerative changes from C3 through C7 with disc space narrowing, endplate irregularity and osteophytes. Posterior disc osteophyte complex at C4-C5 and C5-C6. Upper chest: Mild apical emphysema.  Carotid artery calcification. Other: None IMPRESSION: 1. No CT evidence for acute intracranial abnormality. Atrophy and small vessel ischemic changes of the white matter 2. 4 mm anterolisthesis of C3 and C4, increased compared to prior radiographs from 2010. If ligamentous injury is a concern, further evaluation with MRI would be recommended. No fracture is seen. Electronically Signed   By: Donavan Foil M.D.   On: 08/31/2017 20:25   US Renal  Result Date:  09/01/2017 CLINICAL DATA:  83 year old female with renal failure and severe hyperlipidemia EXAM: RENAL / URINARY TRACT ULTRASOUND COMPLETE COMPARISON:  None. FINDINGS: Right Kidney: Length: 8.5 cm. There is mild increased renal echogenicity. There is no hydronephrosis or echogenic stone. Left Kidney: Length: 10 cm. Mild increased renal parenchyma echogenicity. No hydronephrosis or echogenic stone. Bladder: The urinary bladder is decompressed around a Foley catheter. IMPRESSION: Mild bilateral renal echogenicity likely related to underlying medical renal disease. Clinical correlation is recommended. No hydronephrosis or echogenic stone. Electronically Signed   By: Anner Crete M.D.   On: 09/01/2017 02:33   Dg Hip Unilat W Or Wo Pelvis 2-3 Views Left  Result Date: 08/31/2017 CLINICAL DATA:  83 year old female with fall and left hip pain. EXAM: DG HIP (WITH OR WITHOUT PELVIS) 2-3V LEFT COMPARISON:  None. FINDINGS: There is a total left hip arthroplasty. The orthopedic hardware appears intact and in anatomic alignment. There is no lucency surrounding the hardware to suggest loosening. There is no acute fracture or dislocation. The bones are osteopenic. There are degenerative changes of the lower lumbar spine. Vascular calcification and a right iliac vascular stent noted. The soft tissues appear unremarkable. IMPRESSION: 1. No acute  fracture or dislocation. 2. Left hip arthroplasty appears intact. Electronically Signed   By: Anner Crete M.D.   On: 08/31/2017 20:22   ASSESSMENT AND PLAN:  Lindsey Hobbs  is a 82 y.o. female with below past medical history presenting from home status post a fall as patient was trying to ambulate up her stairs with assist device, lost her balance and fell 5-8 steps, in the emergency room patient primarily complained of left hip pain, noted multiple areas of bruising over entire body, mild abrasions which were treated, noted hypotension with systolic blood pressure in the  80s, hemoglobin 8.4, potassium was greater than 7.5  1 acute renal failure with acute severe hyperkalemia Most likely secondary to multifactorial process which includes acute dehydration/decreased p.o. intake and medication side effect from diuretics/ACE inhibitor use -Appreciate nephrology consultation.  Patient was emergently dialyzed yesterday.  Potassium is down to 4.0 -She is oliguric. -Baseline creatinine 0.96 in July 2018  2 acute hyperkalemia Secondary to above In the ER,treated with dextrose IV, IV insulin, sodium bicarb IV, normal saline solution fluid bolus in the emergency room Continue plan of care as stated above  3 acute hypotension Most likely secondary to acute dehydration IV fluids for rehydration, hold antihypertensives, vitals per routine  4 acute dehydration IV fluids for rehydration  5 COPD without exacerbation Stable Breathing treatments as needed, Pulmicort twice daily  6 chronic Crohn's disease Without exacerbation Stable  No family members in the ICU.  Case discussed with Care Management/Social Worker. Management plans discussed with the patient, family and they are in agreement.  CODE STATUS: DNR  DVT Prophylaxis: Heparin  TOTAL TIME TAKING CARE OF THIS PATIENT: 25 minutes.  >50% time spent on counselling and coordination of care  POSSIBLE D/C IN *few DAYS, DEPENDING ON CLINICAL CONDITION.  Note: This dictation was prepared with Dragon dictation along with smaller phrase technology. Any transcriptional errors that result from this process are unintentional.  Synthia Fairbank M.D on 09/01/2017 at 12:54 PM  Between 7am to 6pm - Pager - [phone]  After 6pm go to [email or web address] - password [email or web address] [facility]  Office  [phone]  CC: Primary care physician; Cletis Athens, MD

## 2017-09-01 NOTE — Progress Notes (Signed)
Discussed with son-(Patient not present)About placing mom in an assisted living facility.Only drawback is patient is a smoker. Consult in to Education officer, museum for placement. Chaplain ask to visit as well.

## 2017-09-01 NOTE — Progress Notes (Signed)
HD STARTED  

## 2017-09-01 NOTE — Consult Note (Signed)
PULMONARY / CRITICAL CARE MEDICINE   Name: Lindsey Hobbs MRN: [id number] DOB: February 08, 1927    ADMISSION DATE:  08/31/2017   CONSULTATION DATE:  09/01/2017  REFERRING MD:  Dr Candiss Norse  REASON: Severe hypokalemia and need for emergent hemodialysis  CHIEF COMPLAINT: Traumatic fall at home  HISTORY OF PRESENT ILLNESS: This is an 83 year old Caucasian female with a past medical history as indicated below who presented to the ED via EMS after a fall at home.  Patient fell from a 5-6 feet flight of stairs at about 6:30 PM.  Her neighbors called EMS.  At the ED, patient was found to be in acute renal failure creatinine level of 3.59 up from her baseline of 1.5.  Her potassium level was critical, greater than 7.5, and her BUN was 123.  She has multiple bruises from her forehead to her legs.  Imaging studies are unremarkable.  She has been seen by nephrology and is being admitted to the ICU for emergent hemodialysis.  Patient is awake, complaining of generalized CVA pain, worse in her lower extremities, 10 on 10, sharp and stabbing in nature. Patient states that she lives by herself and usually at her baseline she is able to perform all her activities of daily living  PAST MEDICAL HISTORY :  She  has a past medical history of Anemia; Avascular necrosis of bone of hip ([facility]); CHF (congestive heart failure) ([place]); COPD (chronic obstructive pulmonary disease) ([place]); Crohn's disease ([place]); DVT (deep venous thrombosis) ([place]) (06/2017); Guillain Barr syndrome ([place]); Hyperlipidemia; and Hypertension.  PAST SURGICAL HISTORY: She  has a past surgical history that includes Abdominal hysterectomy; Colonoscopy with propofol (Left, 07/07/2015); Flexible sigmoidoscopy (N/A, 06/05/2017); Total hip arthroplasty (Left, ~11 yrs ago); Joint replacement; and Lower Extremity Angiography (Left, 08/08/2017).  No Known Allergies  No current facility-administered medications on file prior to encounter.    Current Outpatient  Prescriptions on File Prior to Encounter  Medication Sig  . Calcium Carbonate-Vitamin D (CALCIUM 600+D) 600-400 MG-UNIT per tablet Take 1 tablet by mouth 2 (two) times daily.  . ferrous sulfate 325 (65 FE) MG tablet Take 1 tablet (325 mg total) by mouth 2 (two) times daily with a meal. (Patient taking differently: Take 325 mg by mouth daily. )  . fexofenadine (ALLEGRA) 180 MG tablet Take 180 mg by mouth at bedtime.  . folic acid (FOLVITE) 1 MG tablet Take 1 mg by mouth daily.  [name] gabapentin (NEURONTIN) 100 MG capsule Take 200 mg by mouth. One in the morning and 2 at night  . mesalamine (LIALDA) 1.2 g EC tablet Take 2.4 g by mouth daily with breakfast.   . Omega-3 Fatty Acids (FISH OIL PO) Take 1 capsule by mouth daily.   [name] zolpidem (AMBIEN) 10 MG tablet Take 10 mg by mouth at bedtime as needed for sleep.  [name] aspirin EC 81 MG tablet Take 1 tablet (81 mg total) by mouth daily. (Patient not taking: Reported on 08/31/2017)  . atorvastatin (LIPITOR) 10 MG tablet Take 1 tablet (10 mg total) by mouth daily. (Patient not taking: Reported on 08/31/2017)  . budesonide (PULMICORT) 0.25 MG/2ML nebulizer solution Take 2 mLs (0.25 mg total) by nebulization 2 (two) times daily. (Patient not taking: Reported on 08/31/2017)  . carvedilol (COREG) 3.125 MG tablet Take 1 tablet (3.125 mg total) by mouth 2 (two) times daily with a meal. (Patient not taking: Reported on 08/31/2017)  . clopidogrel (PLAVIX) 75 MG tablet Take 1 tablet (75 mg total) by mouth daily. (Patient not  taking: Reported on 08/31/2017)  . enalapril (VASOTEC) 2.5 MG tablet Take 1 tablet (2.5 mg total) by mouth daily. (Patient not taking: Reported on 08/31/2017)  . furosemide (LASIX) 20 MG tablet Take 1 tablet (20 mg total) by mouth 2 (two) times daily. (Patient not taking: Reported on 08/31/2017)  . guaiFENesin (MUCINEX) 600 MG 12 hr tablet Take 1 tablet (600 mg total) by mouth 2 (two) times daily. (Patient not taking: Reported on 08/31/2017)  .  ipratropium-albuterol (DUONEB) 0.5-2.5 (3) MG/3ML SOLN Take 3 mLs by nebulization every 6 (six) hours as needed. (Patient not taking: Reported on 08/31/2017)  . loratadine (CLARITIN) 10 MG tablet Take 10 mg by mouth daily as needed for allergies.  . pantoprazole (PROTONIX) 40 MG tablet Take 40 mg by mouth daily.  [name] spironolactone (ALDACTONE) 25 MG tablet Take 1 tablet (25 mg total) by mouth daily. (Patient not taking: Reported on 08/31/2017)    FAMILY HISTORY:  Her indicated that the status of her mother is unknown.    SOCIAL HISTORY: She  reports that she has been smoking.  She has a 33.50 pack-year smoking history. She has never used smokeless tobacco. She reports that she does not drink alcohol or use drugs.  REVIEW OF SYSTEMS:   Constitutional: Negative for fever and chills but reports generalized pain and malaise.  HENT: Negative for congestion and rhinorrhea.  Eyes: Negative for redness and visual disturbance.  Respiratory: Negative for shortness of breath and wheezing.  Cardiovascular: Negative for chest pain and palpitations.  Gastrointestinal: Negative  for nausea , vomiting and abdominal pain and  Loose stools Genitourinary: Negative for dysuria and urgency.  Endocrine: Denies polyuria, polyphagia and heat intolerance Musculoskeletal: Positive for myalgias and arthralgias.  History of neuropathic pain Skin: reports Multiple bruises Neurological: Negative for dizziness and headaches   SUBJECTIVE:   VITAL SIGNS: BP (!) 97/41   Pulse 78   Temp (!) 93.7 F (34.3 C)   Resp (!) 21   Ht 5\' 4"  (1.626 m)   Wt 120 lb (54.4 kg)   SpO2 97%   BMI 20.60 kg/m   HEMODYNAMICS:    VENTILATOR SETTINGS: FiO2 (%):  [28 %] 28 %  INTAKE / OUTPUT: No intake/output data recorded.  PHYSICAL EXAMINATION: General: Severe distress, cachetic Neuro:  AAO X2, speech is normal, cranial nerves intact HEENT: Right forehead with small hematoma and bruise, PERRLA, oral mucosa dry, trachea  midline, no JVD Cardiovascular: Apical pulse regular, S1-S2, no murmur regurg or gallop, +2 pulses, no edema Lungs:, Work of breathing, bilateral breath sounds, diminished in the bases, no wheezes or rhonchi Abdomen: Nondistended, normal bowel sounds in all 4 quadrants, palpation reveals no organomegaly Musculoskeletal: Pain with flexion and extension of bilateral lower extremities, walks in the lower extremities, but joint palpation reveals  generalized tenderness, no visible joint swelling Skin: Extensive bruising and multiple skin tears in the forehead upper and lower extremities  LABS:  BMET  Recent Labs Lab 08/31/17 1931 08/31/17 2220  NA 135 140  K >7.5* 6.6*  CL 109 116*  CO2 17* 18*  BUN 123* 115*  CREATININE 3.59* 3.11*  GLUCOSE 134* 149*    Electrolytes  Recent Labs Lab 08/31/17 1931 08/31/17 2220  CALCIUM 10.3 10.2    CBC  Recent Labs Lab 08/31/17 1931  WBC 9.0  HGB 10.4*  HCT 32.2*  PLT 343    Coag's No results for input(s): APTT, INR in the last 168 hours.  Sepsis Markers No results for input(s): LATICACIDVEN,  PROCALCITON, O2SATVEN in the last 168 hours.  ABG No results for input(s): PHART, PCO2ART, PO2ART in the last 168 hours.  Liver Enzymes  Recent Labs Lab 08/31/17 1931 08/31/17 2220  AST 26 26  ALT 8* 9*  ALKPHOS 61 40  BILITOT 0.7 0.3  ALBUMIN 3.1* 2.2*    Cardiac Enzymes  Recent Labs Lab 08/31/17 1931  TROPONINI 0.05*    Glucose  Recent Labs Lab 08/31/17 2152 08/31/17 2244 09/01/17 0039 09/01/17 0048  GLUCAP 287* 109* 29* 211*    Imaging Ct Head Wo Contrast  Result Date: 08/31/2017 CLINICAL DATA:  [place] [address] downstairs EXAM: CT HEAD WITHOUT CONTRAST CT CERVICAL SPINE WITHOUT CONTRAST TECHNIQUE: Multidetector CT imaging of the head and cervical spine was performed following the standard protocol without intravenous contrast. Multiplanar CT image reconstructions of the cervical spine were also generated. COMPARISON:   08/30/2009 FINDINGS: CT HEAD FINDINGS Brain: No acute territorial infarction, hemorrhage or intracranial mass is visualized. Mild atrophy. Mild small vessel ischemic changes of the white matter. Prominent ventricles felt related to atrophy Vascular: No hyperdense vessels.  Carotid artery calcifications. Skull: No fracture or suspicious lesion Sinuses/Orbits: No acute finding. Other: None CT CERVICAL SPINE FINDINGS Alignment: 4 mm anterolisthesis of C3 on C4, appears increased compared with radiographs from 2010. Trace anterolisthesis of C7 on T1 similar compared to prior. Facet alignment is maintained. Skull base and vertebrae: Craniovertebral junction is intact. There is no fracture. Soft tissues and spinal canal: No prevertebral fluid or swelling. No visible canal hematoma. Disc levels: Marked diffuse degenerative changes from C3 through C7 with disc space narrowing, endplate irregularity and osteophytes. Posterior disc osteophyte complex at C4-C5 and C5-C6. Upper chest: Mild apical emphysema.  Carotid artery calcification. Other: None IMPRESSION: 1. No CT evidence for acute intracranial abnormality. Atrophy and small vessel ischemic changes of the white matter 2. 4 mm anterolisthesis of C3 and C4, increased compared to prior radiographs from 2010. If ligamentous injury is a concern, further evaluation with MRI would be recommended. No fracture is seen. Electronically Signed   By: Donavan Foil M.D.   On: 08/31/2017 20:25   Ct Cervical Spine Wo Contrast  Result Date: 08/31/2017 CLINICAL DATA:  [address] downstairs EXAM: CT HEAD WITHOUT CONTRAST CT CERVICAL SPINE WITHOUT CONTRAST TECHNIQUE: Multidetector CT imaging of the head and cervical spine was performed following the standard protocol without intravenous contrast. Multiplanar CT image reconstructions of the cervical spine were also generated. COMPARISON:  08/30/2009 FINDINGS: CT HEAD FINDINGS Brain: No acute territorial infarction, hemorrhage or intracranial  mass is visualized. Mild atrophy. Mild small vessel ischemic changes of the white matter. Prominent ventricles felt related to atrophy Vascular: No hyperdense vessels.  Carotid artery calcifications. Skull: No fracture or suspicious lesion Sinuses/Orbits: No acute finding. Other: None CT CERVICAL SPINE FINDINGS Alignment: 4 mm anterolisthesis of C3 on C4, appears increased compared with radiographs from 2010. Trace anterolisthesis of C7 on T1 similar compared to prior. Facet alignment is maintained. Skull base and vertebrae: Craniovertebral junction is intact. There is no fracture. Soft tissues and spinal canal: No prevertebral fluid or swelling. No visible canal hematoma. Disc levels: Marked diffuse degenerative changes from C3 through C7 with disc space narrowing, endplate irregularity and osteophytes. Posterior disc osteophyte complex at C4-C5 and C5-C6. Upper chest: Mild apical emphysema.  Carotid artery calcification. Other: None IMPRESSION: 1. No CT evidence for acute intracranial abnormality. Atrophy and small vessel ischemic changes of the white matter 2. 4 mm anterolisthesis of C3 and C4, increased compared to  prior radiographs from 2010. If ligamentous injury is a concern, further evaluation with MRI would be recommended. No fracture is seen. Electronically Signed   By: Donavan Foil M.D.   On: 08/31/2017 20:25   US Renal  Result Date: 09/01/2017 CLINICAL DATA:  83 year old female with renal failure and severe hyperlipidemia EXAM: RENAL / URINARY TRACT ULTRASOUND COMPLETE COMPARISON:  None. FINDINGS: Right Kidney: Length: 8.5 cm. There is mild increased renal echogenicity. There is no hydronephrosis or echogenic stone. Left Kidney: Length: 10 cm. Mild increased renal parenchyma echogenicity. No hydronephrosis or echogenic stone. Bladder: The urinary bladder is decompressed around a Foley catheter. IMPRESSION: Mild bilateral renal echogenicity likely related to underlying medical renal disease.  Clinical correlation is recommended. No hydronephrosis or echogenic stone. Electronically Signed   By: Anner Crete M.D.   On: 09/01/2017 02:33   Dg Hip Unilat W Or Wo Pelvis 2-3 Views Left  Result Date: 08/31/2017 CLINICAL DATA:  83 year old female with fall and left hip pain. EXAM: DG HIP (WITH OR WITHOUT PELVIS) 2-3V LEFT COMPARISON:  None. FINDINGS: There is a total left hip arthroplasty. The orthopedic hardware appears intact and in anatomic alignment. There is no lucency surrounding the hardware to suggest loosening. There is no acute fracture or dislocation. The bones are osteopenic. There are degenerative changes of the lower lumbar spine. Vascular calcification and a right iliac vascular stent noted. The soft tissues appear unremarkable. IMPRESSION: 1. No acute fracture or dislocation. 2. Left hip arthroplasty appears intact. Electronically Signed   By: Anner Crete M.D.   On: 08/31/2017 20:22   STUDIES:  Renal ultrasound performed 09/01/2017  CULTURES: None  ANTIBIOTICS: None  SIGNIFICANT EVENTS: 09/01/2017 admitted to the ICU with acute renal failure and severe hyperkalemia  LINES/TUBES: Peripheral IVs Right femoral dialysis catheter  DISCUSSION: 83 year old female presenting with oliguric acute renal failure, severe hypokalemia, severe dehydration, and hypovolemic shock  ASSESSMENT  Acute oliguric renal failure Severe hyperkalemia with EKG changes Hypovolemic shock Severe dehydration Traumatic fall with multiple bruises-CT head negative Generalized pain secondary to fall and multiple injuries Current smoker History of: Hypertension, hyperlipidemia, Guillain Barr syndrome, congestive heart failure, COPD and DVT  PLAN Dynamic monitoring per ICU protocol Nephrology following Hemodialysis per nephrology Dextrose insulin and calcium gluconate for hyperkalemia Trend BMP Pain management with as needed fentanyl and hydrocodone PT/ OT eval when stable IV  fluids Care management consult for possible placement post discharge Pressors to maintain mean arterial blood pressure greater than 65 Resume all home medications except for antihypertensives in light of hypotension Hold Vasotec Spironolactone and calcium supplements per nephrology Follow-up renal ultrasound Smoking cessation advice given Nebulized bronchodilators GI and DVT prophylaxis  No changes in treatment plan pending clinical course and diagnosis patient is acutely ill and at high risk for clinical instability   FAMILY  - Updates:   - Inter-disciplinary family meet or Palliative Care meeting due by:  day 7    Jobe Mutch S. [facility] ANP-BC Pulmonary and Critical Care Medicine [facility] Pager [phone] or [phone]  09/01/2017, 3:58 AM

## 2017-09-01 NOTE — Progress Notes (Signed)
Patient very lethargic this morning but more alert this pm. Son in to visit. Sips most of the day until dinner . Now attempting to eat soup and jello. More hopeful and interactive this pm. Remains on 2 liters nasal cannula and Neo-Synephrine at 40 mcg/hour for blood pressure.

## 2017-09-01 NOTE — Progress Notes (Signed)
Lindsey Hobbs responded to an OR for a Major Life Transition. RN felt an introductory visit from a [place] would be helpful. Pt was awake and alert. Son is bedside. Pt lives alone and fell down the stairs in her home. Pt states that she is in pain and just wants to stop hurting and go home. Pt asked for prayer concerning her healing, which was provided.     09/01/17 1600  Clinical Encounter Type  Visited With Patient;Patient and family together;Health care provider  Visit Type Initial;Spiritual support  Referral From Nurse  Consult/Referral To Chaplain  Spiritual Encounters  Spiritual Needs Prayer

## 2017-09-01 NOTE — H&P (Signed)
[facility] [place] NAME: Lindsey Hobbs    MR#:  [id number]  DATE OF BIRTH:  08/27/1927  DATE OF ADMISSION:  08/31/2017  PRIMARY CARE PHYSICIAN: Cletis Athens, MD   REQUESTING/REFERRING PHYSICIAN:   CHIEF COMPLAINT:   Chief Complaint  Patient presents with  . Fall    HISTORY OF PRESENT ILLNESS: Lindsey Hobbs  is a 83 y.o. female with below past medical history presenting from home status post a fall as patient was trying to ambulate up her stairs with assist device, lost her balance and fell 5-8 steps, in the emergency room patient primarily complained of left hip pain, noted multiple areas of bruising over entire body, mild abrasions which were treated, noted hypotension with systolic blood pressure in the 80s, hemoglobin 8.4, potassium was greater than 7.5-treated in the emergency room with, creatinine 3.5 with baseline normal renal function, BUN 123, urinalysis noted for hyaline casts/amorphus crystals, cardiac enzymes negative x1 set, ED attending did talk to nephrology/Dr. Singh-patient to be admitted to ICU with plans for hemodialysis, hospital service subsequently contacted for further evaluation/care.  Patient evaluated in the emergency room, no apparent distress, resting comfortably in bed, patient now be admitted for acute renal failure, acute hyperkalemia, acute dehydration.  PAST MEDICAL HISTORY:   Past Medical History:  Diagnosis Date  . Anemia   . Avascular necrosis of bone of hip ([place])   . CHF (congestive heart failure) ([place])   . COPD (chronic obstructive pulmonary disease) ([place])   . Crohn's disease ([place])   . DVT (deep venous thrombosis) ([place]) 06/2017  . Guillain Barr syndrome ([place])   . Hyperlipidemia   . Hypertension     PAST SURGICAL HISTORY: Past Surgical History:  Procedure Laterality Date  . ABDOMINAL HYSTERECTOMY    . COLONOSCOPY WITH PROPOFOL Left 07/07/2015   Procedure: COLONOSCOPY WITH PROPOFOL;  Surgeon: Hulen Luster,  MD;  Location: [facility] ENDOSCOPY;  Service: Endoscopy;  Laterality: Left;  . FLEXIBLE SIGMOIDOSCOPY N/A 06/05/2017   Procedure: FLEXIBLE SIGMOIDOSCOPY;  Surgeon: Jonathon Bellows, MD;  Location: [facility] ENDOSCOPY;  Service: Gastroenterology;  Laterality: N/A;  . JOINT REPLACEMENT    . LOWER EXTREMITY ANGIOGRAPHY Left 08/08/2017   Procedure: Lower Extremity Angiography;  Surgeon: Algernon Huxley, MD;  Location: [facility] CV LAB;  Service: Cardiovascular;  Laterality: Left;  . TOTAL HIP ARTHROPLASTY Left ~11 yrs ago    SOCIAL HISTORY:  Social History  Substance Use Topics  . Smoking status: Current Every Day Smoker    Packs/day: 0.50    Years: 67.00  . Smokeless tobacco: Never Used  . Alcohol use No    FAMILY HISTORY:  Family History  Problem Relation Age of Onset  . Breast cancer Mother     DRUG ALLERGIES: No Known Allergies  REVIEW OF SYSTEMS:   CONSTITUTIONAL: No fever, chronic generalized weakness  EYES: No blurred or double vision.  EARS, NOSE, AND THROAT: No tinnitus or ear pain.  RESPIRATORY: No cough, shortness of breath, wheezing or hemoptysis.  CARDIOVASCULAR: No chest pain, orthopnea, edema.  GASTROINTESTINAL: No nausea, vomiting, diarrhea or abdominal pain.  GENITOURINARY: No dysuria, hematuria.  ENDOCRINE: No polyuria, nocturia,  HEMATOLOGY: No anemia, easy bruising or bleeding SKIN: No rash or lesion. MUSCULOSKELETAL: Left hip pain, diffuse generalized joint pain    NEUROLOGIC: No tingling, numbness, weakness.  PSYCHIATRY: No anxiety or depression.   MEDICATIONS AT HOME:  Prior to Admission medications   Medication Sig Start Date End Date Taking? Authorizing  Provider  Calcium Carbonate-Vitamin D (CALCIUM 600+D) 600-400 MG-UNIT per tablet Take 1 tablet by mouth 2 (two) times daily.   Yes [provider]  ferrous sulfate 325 (65 FE) MG tablet Take 1 tablet (325 mg total) by mouth 2 (two) times daily with a meal. Patient taking differently: Take 325 mg by mouth  daily.  07/08/15  Yes Sudini, Alveta Heimlich, MD  fexofenadine (ALLEGRA) 180 MG tablet Take 180 mg by mouth at bedtime.   Yes [provider]  folic acid (FOLVITE) 1 MG tablet Take 1 mg by mouth daily.   Yes [provider]  gabapentin (NEURONTIN) 100 MG capsule Take 200 mg by mouth. One in the morning and 2 at night   Yes [provider]  mesalamine (LIALDA) 1.2 g EC tablet Take 2.4 g by mouth daily with breakfast.    Yes [provider]  Multiple Vitamin (MULTIVITAMIN) tablet Take 1 tablet by mouth daily.   Yes [provider]  Omega-3 Fatty Acids (FISH OIL PO) Take 1 capsule by mouth daily.    Yes [provider]  zolpidem (AMBIEN) 10 MG tablet Take 10 mg by mouth at bedtime as needed for sleep.   Yes [provider]  aspirin EC 81 MG tablet Take 1 tablet (81 mg total) by mouth daily. Patient not taking: Reported on 08/31/2017 08/08/17   Algernon Huxley, MD  atorvastatin (LIPITOR) 10 MG tablet Take 1 tablet (10 mg total) by mouth daily. Patient not taking: Reported on 08/31/2017 08/08/17 08/08/18  Algernon Huxley, MD  budesonide (PULMICORT) 0.25 MG/2ML nebulizer solution Take 2 mLs (0.25 mg total) by nebulization 2 (two) times daily. Patient not taking: Reported on 08/31/2017 06/24/17   Epifanio Lesches, MD  carvedilol (COREG) 3.125 MG tablet Take 1 tablet (3.125 mg total) by mouth 2 (two) times daily with a meal. Patient not taking: Reported on 08/31/2017 06/24/17   Epifanio Lesches, MD  clopidogrel (PLAVIX) 75 MG tablet Take 1 tablet (75 mg total) by mouth daily. Patient not taking: Reported on 08/31/2017 08/08/17   Algernon Huxley, MD  enalapril (VASOTEC) 2.5 MG tablet Take 1 tablet (2.5 mg total) by mouth daily. Patient not taking: Reported on 08/31/2017 06/25/17   Epifanio Lesches, MD  furosemide (LASIX) 20 MG tablet Take 1 tablet (20 mg total) by mouth 2 (two) times daily. Patient not taking: Reported on 08/31/2017 06/24/17 06/24/18   Epifanio Lesches, MD  guaiFENesin (MUCINEX) 600 MG 12 hr tablet Take 1 tablet (600 mg total) by mouth 2 (two) times daily. Patient not taking: Reported on 08/31/2017 06/06/17   Gladstone Lighter, MD  ipratropium-albuterol (DUONEB) 0.5-2.5 (3) MG/3ML SOLN Take 3 mLs by nebulization every 6 (six) hours as needed. Patient not taking: Reported on 08/31/2017 06/06/17   Gladstone Lighter, MD  loratadine (CLARITIN) 10 MG tablet Take 10 mg by mouth daily as needed for allergies.    [provider]  pantoprazole (PROTONIX) 40 MG tablet Take 40 mg by mouth daily. 05/13/17   [provider]  spironolactone (ALDACTONE) 25 MG tablet Take 1 tablet (25 mg total) by mouth daily. Patient not taking: Reported on 08/31/2017 06/24/17 06/24/18  Epifanio Lesches, MD      PHYSICAL EXAMINATION:   VITAL SIGNS: Blood pressure (!) 91/42, pulse 79, temperature (!) 93 F (33.9 C), temperature source Rectal, resp. rate 20, height 5\' 4"  (1.626 m), weight 54.4 kg (120 lb), SpO2 94 %.  GENERAL:  83 y.o.-year-old patient lying in the bed with no  acute distress.  Frail-appearing EYES: Pupils equal, round, reactive to light and accommodation. No scleral icterus. Extraocular muscles intact.  HEENT:  cephalohematoma, normocephalic. Oropharynx and nasopharynx clear.  NECK:  Supple, no jugular venous distention. No thyroid enlargement, no tenderness.  LUNGS: Normal breath sounds bilaterally, no wheezing, rales,rhonchi or crepitation. No use of accessory muscles of respiration.  CARDIOVASCULAR: S1, S2 normal. No murmurs, rubs, or gallops.  ABDOMEN: Soft, nontender, nondistended. Bowel sounds present. No organomegaly or mass.  EXTREMITIES: No pedal edema, cyanosis, or clubbing.  NEUROLOGIC: Cranial nerves II through XII are intact. Muscle strength 5/5 in all extremities. Sensation intact. Gait not checked.  PSYCHIATRIC: The patient is alert and oriented x 3.  SKIN: Multiple areas of skin ecchymosis/bruising in  various stages of healing    LABORATORY PANEL:   CBC  Recent Labs Lab 08/31/17 1931  WBC 9.0  HGB 10.4*  HCT 32.2*  PLT 343  MCV 97.8  MCH 31.6  MCHC 32.3  RDW 17.5*  LYMPHSABS 0.7*  MONOABS 0.6  EOSABS 0.0  BASOSABS 0.0   ------------------------------------------------------------------------------------------------------------------  Chemistries   Recent Labs Lab 08/31/17 1931 08/31/17 2220  NA 135 140  K >7.5* 6.6*  CL 109 116*  CO2 17* 18*  GLUCOSE 134* 149*  BUN 123* 115*  CREATININE 3.59* 3.11*  CALCIUM 10.3 10.2  AST 26 26  ALT 8* 9*  ALKPHOS 61 40  BILITOT 0.7 0.3   ------------------------------------------------------------------------------------------------------------------ estimated creatinine clearance is 10.7 mL/min (A) (by C-G formula based on SCr of 3.11 mg/dL (H)). ------------------------------------------------------------------------------------------------------------------ No results for input(s): TSH, T4TOTAL, T3FREE, THYROIDAB in the last 72 hours.  Invalid input(s): FREET3   Coagulation profile No results for input(s): INR, PROTIME in the last 168 hours. ------------------------------------------------------------------------------------------------------------------- No results for input(s): DDIMER in the last 72 hours. -------------------------------------------------------------------------------------------------------------------  Cardiac Enzymes  Recent Labs Lab 08/31/17 1931  TROPONINI 0.05*   ------------------------------------------------------------------------------------------------------------------ Invalid input(s): POCBNP  ---------------------------------------------------------------------------------------------------------------  Urinalysis    Component Value Date/Time   COLORURINE AMBER (A) 08/31/2017 1931   APPEARANCEUR HAZY (A) 08/31/2017 1931   APPEARANCEUR Clear 02/27/2013 2045   LABSPEC  1.019 08/31/2017 1931   LABSPEC 1.012 02/27/2013 2045   PHURINE 5.0 08/31/2017 1931   GLUCOSEU NEGATIVE 08/31/2017 1931   GLUCOSEU Negative 02/27/2013 2045   HGBUR NEGATIVE 08/31/2017 1931   BILIRUBINUR NEGATIVE 08/31/2017 1931   BILIRUBINUR Negative 02/27/2013 2045   KETONESUR NEGATIVE 08/31/2017 1931   PROTEINUR NEGATIVE 08/31/2017 1931   NITRITE NEGATIVE 08/31/2017 1931   LEUKOCYTESUR NEGATIVE 08/31/2017 1931   LEUKOCYTESUR Negative 02/27/2013 2045     RADIOLOGY: Ct Head Wo Contrast  Result Date: 08/31/2017 CLINICAL DATA:  [place] [address] downstairs EXAM: CT HEAD WITHOUT CONTRAST CT CERVICAL SPINE WITHOUT CONTRAST TECHNIQUE: Multidetector CT imaging of the head and cervical spine was performed following the standard protocol without intravenous contrast. Multiplanar CT image reconstructions of the cervical spine were also generated. COMPARISON:  08/30/2009 FINDINGS: CT HEAD FINDINGS Brain: No acute territorial infarction, hemorrhage or intracranial mass is visualized. Mild atrophy. Mild small vessel ischemic changes of the white matter. Prominent ventricles felt related to atrophy Vascular: No hyperdense vessels.  Carotid artery calcifications. Skull: No fracture or suspicious lesion Sinuses/Orbits: No acute finding. Other: None CT CERVICAL SPINE FINDINGS Alignment: 4 mm anterolisthesis of C3 on C4, appears increased compared with radiographs from 2010. Trace anterolisthesis of C7 on T1 similar compared to prior. Facet alignment is maintained. Skull base and vertebrae: Craniovertebral junction is intact. There is no fracture. Soft tissues and spinal canal: No  prevertebral fluid or swelling. No visible canal hematoma. Disc levels: Marked diffuse degenerative changes from C3 through C7 with disc space narrowing, endplate irregularity and osteophytes. Posterior disc osteophyte complex at C4-C5 and C5-C6. Upper chest: Mild apical emphysema.  Carotid artery calcification. Other: None IMPRESSION: 1. No CT  evidence for acute intracranial abnormality. Atrophy and small vessel ischemic changes of the white matter 2. 4 mm anterolisthesis of C3 and C4, increased compared to prior radiographs from 2010. If ligamentous injury is a concern, further evaluation with MRI would be recommended. No fracture is seen. Electronically Signed   By: Donavan Foil M.D.   On: 08/31/2017 20:25   Ct Cervical Spine Wo Contrast  Result Date: 08/31/2017 CLINICAL DATA:  [address] downstairs EXAM: CT HEAD WITHOUT CONTRAST CT CERVICAL SPINE WITHOUT CONTRAST TECHNIQUE: Multidetector CT imaging of the head and cervical spine was performed following the standard protocol without intravenous contrast. Multiplanar CT image reconstructions of the cervical spine were also generated. COMPARISON:  08/30/2009 FINDINGS: CT HEAD FINDINGS Brain: No acute territorial infarction, hemorrhage or intracranial mass is visualized. Mild atrophy. Mild small vessel ischemic changes of the white matter. Prominent ventricles felt related to atrophy Vascular: No hyperdense vessels.  Carotid artery calcifications. Skull: No fracture or suspicious lesion Sinuses/Orbits: No acute finding. Other: None CT CERVICAL SPINE FINDINGS Alignment: 4 mm anterolisthesis of C3 on C4, appears increased compared with radiographs from 2010. Trace anterolisthesis of C7 on T1 similar compared to prior. Facet alignment is maintained. Skull base and vertebrae: Craniovertebral junction is intact. There is no fracture. Soft tissues and spinal canal: No prevertebral fluid or swelling. No visible canal hematoma. Disc levels: Marked diffuse degenerative changes from C3 through C7 with disc space narrowing, endplate irregularity and osteophytes. Posterior disc osteophyte complex at C4-C5 and C5-C6. Upper chest: Mild apical emphysema.  Carotid artery calcification. Other: None IMPRESSION: 1. No CT evidence for acute intracranial abnormality. Atrophy and small vessel ischemic changes of the white  matter 2. 4 mm anterolisthesis of C3 and C4, increased compared to prior radiographs from 2010. If ligamentous injury is a concern, further evaluation with MRI would be recommended. No fracture is seen. Electronically Signed   By: Donavan Foil M.D.   On: 08/31/2017 20:25   Dg Hip Unilat W Or Wo Pelvis 2-3 Views Left  Result Date: 08/31/2017 CLINICAL DATA:  83 year old female with fall and left hip pain. EXAM: DG HIP (WITH OR WITHOUT PELVIS) 2-3V LEFT COMPARISON:  None. FINDINGS: There is a total left hip arthroplasty. The orthopedic hardware appears intact and in anatomic alignment. There is no lucency surrounding the hardware to suggest loosening. There is no acute fracture or dislocation. The bones are osteopenic. There are degenerative changes of the lower lumbar spine. Vascular calcification and a right iliac vascular stent noted. The soft tissues appear unremarkable. IMPRESSION: 1. No acute fracture or dislocation. 2. Left hip arthroplasty appears intact. Electronically Signed   By: Anner Crete M.D.   On: 08/31/2017 20:22    EKG: Orders placed or performed during the hospital encounter of 08/31/17  . EKG 12-Lead  . EKG 12-Lead  . ED EKG  . ED EKG  . ED EKG  . ED EKG  . EKG 12-Lead  . EKG 12-Lead  . EKG 12-Lead  . EKG 12-Lead  . EKG 12-Lead  . EKG 12-Lead  . ED EKG  . ED EKG    IMPRESSION AND PLAN: 1 acute renal failure with acute severe hyperkalemia Most likely secondary to multifactorial  process which includes acute dehydration/decreased p.o. intake and medication side effect from diuretics/ACE inhibitor use Admit to ICU, nephrology/Dr. Candiss Norse input greatly appreciated-has plans for hemodialysis later today, discontinue diuretics/Spironolactone/lisinopril, IV fluids for rehydration, strict I&O monitoring, Foley placement, check renal ultrasound, BMP daily, and continue close medical monitoring  2 acute hyperkalemia Secondary to above Treated with dextrose IV, IV insulin,  sodium bicarb IV, normal saline solution fluid bolus in the emergency room Continue plan of care as stated above  3 acute hypotension Most likely secondary to acute dehydration IV fluids for rehydration, hold antihypertensives, vitals per routine, rule out acute coronary syndrome with cardiac enzymes x3 sets  4 acute dehydration IV fluids for rehydration  5 COPD without exacerbation Stable Breathing treatments as needed, Pulmicort twice daily  6 chronic Crohn's disease Without exacerbation Stable Continue mesalamine   DNR per patient wishes Condition stable Prognosis fair DVT prophylaxis with heparin subcu Disposition home in 2-3 days   All the records are reviewed and case discussed with ED provider. Management plans discussed with the patient, family and they are in agreement.  CODE STATUS: Code Status History    Date Active Date Inactive Code Status Order ID Comments User Context   08/08/2017 12:12 PM 08/08/2017  5:25 PM Full Code [id number]  Algernon Huxley, MD Inpatient   06/18/2017  3:38 PM 06/24/2017  7:37 PM DNR [id number]  Theodoro Grist, MD ED   06/01/2017  3:00 PM 06/06/2017  5:42 PM DNR [id number]  Baxter Hire, MD Inpatient   07/05/2015  5:33 PM 07/08/2015  8:17 PM DNR [id number]  Vaughan Basta, MD Inpatient    Advance Directive Documentation     Most Recent Value  Type of Advance Directive  Healthcare Power of Attorney, Living will, Out of facility DNR (pink MOST or yellow form)  Pre-existing out of facility DNR order (yellow form or pink MOST form)  -  "MOST" Form in Place?  -       TOTAL TIME TAKING CARE OF THIS PATIENT: 45 minutes.    Avel Peace Salary M.D on 09/01/2017   Between 7am to 6pm - Pager - [phone]  After 6pm go to [email or web address] - password [email or web address] [facility]  Office  [phone]  CC: Primary care physician; Cletis Athens, MD   Note: This dictation was prepared with Dragon dictation along with smaller phrase  technology. Any transcriptional errors that result from this process are unintentional.

## 2017-09-01 NOTE — Progress Notes (Signed)
[facility], [place] 09/01/17  Subjective:   Patient underwent emergent dialysis yesterday for hyperkalemia Potassium 4.4 this morning Denies any acute complaints but appears disoriented.  Objective:  Vital signs in last 24 hours:  Temp:  [91.2 F (32.9 C)-97.3 F (36.3 C)] 97.3 F (36.3 C) (10/28 0615) Pulse Rate:  [56-105] 94 (10/28 0800) Resp:  [13-26] 21 (10/28 0800) BP: (70-110)/(36-80) 97/64 (10/28 0800) SpO2:  [90 %-98 %] 93 % (10/28 0800) FiO2 (%):  [28 %] 28 % (10/28 0126) Weight:  [54.4 kg (120 lb)] 54.4 kg (120 lb) (10/27 1927)  Weight change:  Filed Weights   08/31/17 1927  Weight: 54.4 kg (120 lb)    Intake/Output:    Intake/Output Summary (Last 24 hours) at 09/01/17 1011 Last data filed at 09/01/17 0600  Gross per 24 hour  Intake          4068.42 ml  Output               75 ml  Net          3993.42 ml     Physical Exam: General:  Frail elderly, laying in the bed  HEENT  oral mucous membranes dry  Neck  no distended neck veins  Pulm/lungs  normal breathing effort, coarse breath sounds bilaterally  CVS/Heart  normal sinus rhythm   Abdomen:  soft, nondistended  Extremities:  No peripheral edema  Neurologic:  Lethargic, disoriented  Skin:  Bruises over chest, forehead  Access:  Right femoral temporary dialysis catheter 10/28        Basic Metabolic Panel:   Recent Labs Lab 08/31/17 1931 08/31/17 2220 09/01/17 0315 09/01/17 0415 09/01/17 0525  NA 135 140 139  --   --   K >7.5* 6.6* 7.4* 6.0* 4.4  CL 109 116* 117*  --   --   CO2 17* 18* 18*  --   --   GLUCOSE 134* 149* 127*  --   --   BUN 123* 115* 98*  --   --   CREATININE 3.59* 3.11* 2.29*  --   --   CALCIUM 10.3 10.2 8.9  --   --      CBC:  Recent Labs Lab 08/31/17 1931 09/01/17 0315  WBC 9.0 9.3  NEUTROABS 7.6*  --   HGB 10.4* 8.1*  HCT 32.2* 25.1*  MCV 97.8 97.7  PLT 343 247     No results found for: HEPBSAG, HEPBSAB,  HEPBIGM    Microbiology:  Recent Results (from the past 240 hour(s))  MRSA PCR Screening     Status: None   Collection Time: 09/01/17  1:21 AM  Result Value Ref Range Status   MRSA by PCR NEGATIVE NEGATIVE Final    Comment:        The GeneXpert MRSA Assay (FDA approved for NASAL specimens only), is one component of a comprehensive MRSA colonization surveillance program. It is not intended to diagnose MRSA infection nor to guide or monitor treatment for MRSA infections.     Coagulation Studies: No results for input(s): LABPROT, INR in the last 72 hours.  Urinalysis:  Recent Labs  08/31/17 1931  COLORURINE AMBER*  LABSPEC 1.019  PHURINE 5.0  GLUCOSEU NEGATIVE  HGBUR NEGATIVE  BILIRUBINUR NEGATIVE  KETONESUR NEGATIVE  PROTEINUR NEGATIVE  NITRITE NEGATIVE  LEUKOCYTESUR NEGATIVE      Imaging: Ct Head Wo Contrast  Result Date: 08/31/2017 CLINICAL DATA:  [place] downstairs EXAM: CT HEAD WITHOUT CONTRAST CT CERVICAL SPINE WITHOUT  CONTRAST TECHNIQUE: Multidetector CT imaging of the head and cervical spine was performed following the standard protocol without intravenous contrast. Multiplanar CT image reconstructions of the cervical spine were also generated. COMPARISON:  08/30/2009 FINDINGS: CT HEAD FINDINGS Brain: No acute territorial infarction, hemorrhage or intracranial mass is visualized. Mild atrophy. Mild small vessel ischemic changes of the white matter. Prominent ventricles felt related to atrophy Vascular: No hyperdense vessels.  Carotid artery calcifications. Skull: No fracture or suspicious lesion Sinuses/Orbits: No acute finding. Other: None CT CERVICAL SPINE FINDINGS Alignment: 4 mm anterolisthesis of C3 on C4, appears increased compared with radiographs from 2010. Trace anterolisthesis of C7 on T1 similar compared to prior. Facet alignment is maintained. Skull base and vertebrae: Craniovertebral junction is intact. There is no fracture. Soft tissues and spinal  canal: No prevertebral fluid or swelling. No visible canal hematoma. Disc levels: Marked diffuse degenerative changes from C3 through C7 with disc space narrowing, endplate irregularity and osteophytes. Posterior disc osteophyte complex at C4-C5 and C5-C6. Upper chest: Mild apical emphysema.  Carotid artery calcification. Other: None IMPRESSION: 1. No CT evidence for acute intracranial abnormality. Atrophy and small vessel ischemic changes of the white matter 2. 4 mm anterolisthesis of C3 and C4, increased compared to prior radiographs from 2010. If ligamentous injury is a concern, further evaluation with MRI would be recommended. No fracture is seen. Electronically Signed   By: Donavan Foil M.D.   On: 08/31/2017 20:25   Ct Cervical Spine Wo Contrast  Result Date: 08/31/2017 CLINICAL DATA:  [place] downstairs EXAM: CT HEAD WITHOUT CONTRAST CT CERVICAL SPINE WITHOUT CONTRAST TECHNIQUE: Multidetector CT imaging of the head and cervical spine was performed following the standard protocol without intravenous contrast. Multiplanar CT image reconstructions of the cervical spine were also generated. COMPARISON:  08/30/2009 FINDINGS: CT HEAD FINDINGS Brain: No acute territorial infarction, hemorrhage or intracranial mass is visualized. Mild atrophy. Mild small vessel ischemic changes of the white matter. Prominent ventricles felt related to atrophy Vascular: No hyperdense vessels.  Carotid artery calcifications. Skull: No fracture or suspicious lesion Sinuses/Orbits: No acute finding. Other: None CT CERVICAL SPINE FINDINGS Alignment: 4 mm anterolisthesis of C3 on C4, appears increased compared with radiographs from 2010. Trace anterolisthesis of C7 on T1 similar compared to prior. Facet alignment is maintained. Skull base and vertebrae: Craniovertebral junction is intact. There is no fracture. Soft tissues and spinal canal: No prevertebral fluid or swelling. No visible canal hematoma. Disc levels: Marked diffuse  degenerative changes from C3 through C7 with disc space narrowing, endplate irregularity and osteophytes. Posterior disc osteophyte complex at C4-C5 and C5-C6. Upper chest: Mild apical emphysema.  Carotid artery calcification. Other: None IMPRESSION: 1. No CT evidence for acute intracranial abnormality. Atrophy and small vessel ischemic changes of the white matter 2. 4 mm anterolisthesis of C3 and C4, increased compared to prior radiographs from 2010. If ligamentous injury is a concern, further evaluation with MRI would be recommended. No fracture is seen. Electronically Signed   By: Donavan Foil M.D.   On: 08/31/2017 20:25   US Renal  Result Date: 09/01/2017 CLINICAL DATA:  83 year old female with renal failure and severe hyperlipidemia EXAM: RENAL / URINARY TRACT ULTRASOUND COMPLETE COMPARISON:  None. FINDINGS: Right Kidney: Length: 8.5 cm. There is mild increased renal echogenicity. There is no hydronephrosis or echogenic stone. Left Kidney: Length: 10 cm. Mild increased renal parenchyma echogenicity. No hydronephrosis or echogenic stone. Bladder: The urinary bladder is decompressed around a Foley catheter. IMPRESSION: Mild bilateral renal echogenicity likely  related to underlying medical renal disease. Clinical correlation is recommended. No hydronephrosis or echogenic stone. Electronically Signed   By: Anner Crete M.D.   On: 09/01/2017 02:33   Dg Hip Unilat W Or Wo Pelvis 2-3 Views Left  Result Date: 08/31/2017 CLINICAL DATA:  83 year old female with fall and left hip pain. EXAM: DG HIP (WITH OR WITHOUT PELVIS) 2-3V LEFT COMPARISON:  None. FINDINGS: There is a total left hip arthroplasty. The orthopedic hardware appears intact and in anatomic alignment. There is no lucency surrounding the hardware to suggest loosening. There is no acute fracture or dislocation. The bones are osteopenic. There are degenerative changes of the lower lumbar spine. Vascular calcification and a right iliac vascular  stent noted. The soft tissues appear unremarkable. IMPRESSION: 1. No acute fracture or dislocation. 2. Left hip arthroplasty appears intact. Electronically Signed   By: Anner Crete M.D.   On: 08/31/2017 20:22     Medications:   . dextrose 5 % and 0.45% NaCl 125 mL/hr (09/01/17 0854)  . phenylephrine (NEO-SYNEPHRINE) Adult infusion 70 mcg/min (09/01/17 0854)   . aspirin EC  81 mg Oral Daily  . atorvastatin  10 mg Oral Daily  . budesonide  0.25 mg Nebulization BID  . calcium-vitamin D  1 tablet Oral Q breakfast  . clopidogrel  75 mg Oral Daily  . ferrous sulfate  325 mg Oral Daily  . folic acid  1 mg Oral Daily  . gabapentin  200 mg Oral BID  . heparin  5,000 Units Subcutaneous Q8H  . ipratropium-albuterol  3 mL Nebulization Q6H  . mouth rinse  15 mL Mouth Rinse BID  . mesalamine  2.4 g Oral Q breakfast  . multivitamin with minerals  1 tablet Oral Daily  . omega-3 acid ethyl esters  1 g Oral Daily  . pantoprazole  40 mg Oral Daily  . sodium chloride flush  10-40 mL Intracatheter Q12H  . sodium chloride flush  3 mL Intravenous Q12H   acetaminophen **OR** acetaminophen, fentaNYL (SUBLIMAZE) injection, HYDROcodone-acetaminophen, loratadine, ondansetron **OR** ondansetron (ZOFRAN) IV, polyethylene glycol, sodium chloride flush  Assessment/ Plan:  83 y.o. female with medical problems of CHF (EF 35-40% from 06/2017),severe TR and MR, COPD, crohn's disease, DVT, HLD, HTN, h/o Hansel Feinstein who was admitted to [facility] on 08/31/2017 for evaluation post fall  1.  Acute renal failure, oliguric 2.  Severe hyperkalemia, with EKG changes 3.  Severe hypotension and dehydration 4.  Hypercalcemia  Plan: Potassium level is now correct. Urine output appears to be improving with IV hydration BUN/creatinine have improved to 98/2.29 Renal ultrasound shows bilateral renal echogenicity, no hydronephrosis or stone No acute indication for dialysis at present.  Continue to monitor creatinine,  electrolytes and volume status Check CK level We will follow.    LOS: [place] 10/28/201810:11 [place], [place]

## 2017-09-01 NOTE — Progress Notes (Signed)
This note also relates to the following rows which could not be included: Temp - Cannot attach notes to unvalidated device data Pulse Rate - Cannot attach notes to unvalidated device data Resp - Cannot attach notes to unvalidated device data BP - Cannot attach notes to unvalidated device data  HD COMPLETED

## 2017-09-01 NOTE — Procedures (Signed)
Hemodialysis Catheter Insertion Procedure Note KARYNA BESSLER [id number] 02-18-1927  Procedure: Insertion of Central Venous Catheter Indications: Assessment of intravascular volume, Drug and/or fluid administration and Frequent blood sampling  Procedure Details Consent: Risks of procedure as well as the alternatives and risks of each were explained to the (patient/caregiver).  Consent for procedure obtained. Time Out: Verified patient identification, verified procedure, site/side was marked, verified correct patient position, special equipment/implants available, medications/allergies/relevent history reviewed, required imaging and test results available.  Performed  Maximum sterile technique was used including antiseptics, cap, gloves, gown, hand hygiene, mask and sheet. Skin prep: Chlorhexidine; local anesthetic administered A antimicrobial bonded/coated triple lumen catheter was placed in the right femoral vein due to patient being a dialysis patient using the Seldinger technique.  Evaluation Blood flow good Complications: No apparent complications Patient did tolerate procedure well. Chest X-ray ordered to verify placement.  CXR: not indicated for femoral line.  Procedure performed under direct supervision of Dr.Kasa. Ultrasound utilized for realtime vessel cannulation  Magdalene S. [facility] ANP-BC Pulmonary and Critical Care Medicine [facility] Pager [phone] or [phone]  09/01/2017, 6:03 AM

## 2017-09-02 LAB — CBC
HEMATOCRIT: 22.4 % — AB (ref 35.0–47.0)
Hemoglobin: 7.2 g/dL — ABNORMAL LOW (ref 12.0–16.0)
MCH: 31.1 pg (ref 26.0–34.0)
MCHC: 32.3 g/dL (ref 32.0–36.0)
MCV: 96.4 fL (ref 80.0–100.0)
Platelets: 221 10*3/uL (ref 150–440)
RBC: 2.32 MIL/uL — ABNORMAL LOW (ref 3.80–5.20)
RDW: 16.9 % — AB (ref 11.5–14.5)
WBC: 5.6 10*3/uL (ref 3.6–11.0)

## 2017-09-02 LAB — RENAL FUNCTION PANEL
Albumin: 2 g/dL — ABNORMAL LOW (ref 3.5–5.0)
Anion gap: 3 — ABNORMAL LOW (ref 5–15)
BUN: 34 mg/dL — AB (ref 6–20)
CO2: 24 mmol/L (ref 22–32)
Calcium: 7.3 mg/dL — ABNORMAL LOW (ref 8.9–10.3)
Chloride: 113 mmol/L — ABNORMAL HIGH (ref 101–111)
Creatinine, Ser: 0.97 mg/dL (ref 0.44–1.00)
GFR calc Af Amer: 59 mL/min — ABNORMAL LOW (ref >60)
GFR, EST NON AFRICAN AMERICAN: 51 mL/min — AB (ref >60)
Glucose, Bld: 101 mg/dL — ABNORMAL HIGH (ref 65–99)
PHOSPHORUS: 2.2 mg/dL — AB (ref 2.5–4.6)
Potassium: 4.9 mmol/L (ref 3.5–5.1)
Sodium: 140 mmol/L (ref 135–145)

## 2017-09-02 LAB — GLUCOSE, CAPILLARY
Glucose-Capillary: 129 mg/dL — ABNORMAL HIGH (ref 65–99)
Glucose-Capillary: 86 mg/dL (ref 65–99)
Glucose-Capillary: 89 mg/dL (ref 65–99)
Glucose-Capillary: 99 mg/dL (ref 65–99)

## 2017-09-02 LAB — CK: Total CK: 371 U/L — ABNORMAL HIGH (ref 38–234)

## 2017-09-02 LAB — URINE CULTURE: Culture: NO GROWTH

## 2017-09-02 LAB — MAGNESIUM
Magnesium: 1 mg/dL — ABNORMAL LOW (ref 1.7–2.4)
Magnesium: 1.7 mg/dL (ref 1.7–2.4)

## 2017-09-02 LAB — HEPATITIS B SURFACE ANTIGEN: HEP B S AG: NEGATIVE

## 2017-09-02 LAB — HEPATITIS B CORE ANTIBODY, TOTAL: Hep B Core Total Ab: NEGATIVE

## 2017-09-02 LAB — HEPATITIS B SURFACE ANTIBODY,QUALITATIVE: HEP B S AB: NONREACTIVE

## 2017-09-02 LAB — HEPATITIS C ANTIBODY: HCV Ab: <0.1 s/co ratio (ref 0.0–0.9)

## 2017-09-02 MED ORDER — MAGNESIUM SULFATE 2 GM/50ML IV SOLN
2.0000 g | Freq: Once | INTRAVENOUS | Status: AC
  Administered 2017-09-02: 2 g via INTRAVENOUS
  Filled 2017-09-02: qty 50

## 2017-09-02 MED ORDER — SODIUM CHLORIDE 0.9 % IV SOLN
0.0000 ug/min | INTRAVENOUS | Status: DC
  Filled 2017-09-02: qty 1

## 2017-09-02 NOTE — Progress Notes (Signed)
Good day. Up with PT to chair. Walks slowly but balance good. More cooperative and pleasant today. Son has not visited yet today. Transitioned off  Neo-Synephrine easily. Had loose BM. PT evaluation suggested SNF but I feel she would benefit more from assisted living with outpatient PT. She is a smoker and very independent. Otherwise VSS without issues.

## 2017-09-02 NOTE — Consult Note (Signed)
PULMONARY / CRITICAL CARE MEDICINE   Name: RHYAN WOLTERS MRN: [id number] DOB: December 06, 1926    ADMISSION DATE:  08/31/2017   CONSULTATION DATE:  09/01/2017  REFERRING MD:  Dr Candiss Norse  REASON: Severe hypokalemia and need for emergent hemodialysis  CHIEF COMPLAINT: Traumatic fall at home  HISTORY OF PRESENT ILLNESS: This is an 83 year old Caucasian female with a past medical history as indicated below who presented to the ED via EMS after a fall at home.  Patient fell from a 5-6 feet flight of stairs at about 6:30 PM.  Her neighbors called EMS.  At the ED, patient was found to be in acute renal failure creatinine level of 3.59 up from her baseline of 1.5.  Her potassium level was critical, greater than 7.5, and her BUN was 123.  She has multiple bruises from her forehead to her legs.  Imaging studies are unremarkable.  She has been seen by nephrology and is being admitted to the ICU for emergent hemodialysis.  Patient is awake, complaining of generalized CVA pain, worse in her lower extremities, 10 on 10, sharp and stabbing in nature. Patient states that she lives by herself and usually at her baseline she is able to perform all her activities of daily living  SUBJECTIVE Alert and awake Follows commands NAD, no pain  REVIEW OF SYSTEMS:   Constitutional: Negative for fever and chills but reports generalized pain and malaise.  HENT: Negative for congestion and rhinorrhea.  Eyes: Negative for redness and visual disturbance.  Respiratory: Negative for shortness of breath and wheezing.  Cardiovascular: Negative for chest pain and palpitations.  Gastrointestinal: Negative  for nausea , vomiting and abdominal pain and  Loose stools Genitourinary: Negative for dysuria and urgency.  Endocrine: Denies polyuria, polyphagia and heat intolerance Musculoskeletal: Positive for myalgias and arthralgias.  History of neuropathic pain Skin: reports Multiple bruises Neurological: Negative for dizziness and  headaches    VITAL SIGNS: BP (!) 92/53   Pulse 90   Temp 98.1 F (36.7 C)   Resp 19   Ht 5\' 4"  (1.626 m)   Wt 124 lb 1.9 oz (56.3 kg)   SpO2 96%   BMI 21.30 kg/m   HEMODYNAMICS:    VENTILATOR SETTINGS: FiO2 (%):  [28 %] 28 %  INTAKE / OUTPUT: I/O last 3 completed shifts: In: 9374.6 [P.O.:300; I.V.:6074.6; IV Piggyback:3000] Out: 775 [Urine:975]  PHYSICAL EXAMINATION: General:  cachetic Neuro:  AAO X4, speech is normal, cranial nerves intact HEENT: Right forehead with small hematoma and bruise, PERRLA, oral mucosa dry, trachea midline, no JVD Cardiovascular: Apical pulse regular, S1-S2, no murmur regurg or gallop, +2 pulses, no edema Lungs:, Work of breathing, bilateral breath sounds, diminished in the bases, no wheezes or rhonchi Abdomen: Nondistended, normal bowel sounds in all 4 quadrants, palpation reveals no organomegaly Musculoskeletal: Pain with flexion and extension of bilateral lower extremities, walks in the lower extremities, but joint palpation reveals  generalized tenderness, no visible joint swelling Skin: Extensive bruising and multiple skin tears in the forehead upper and lower extremities  LABS:  BMET  Recent Labs Lab 08/31/17 2220 09/01/17 0315 09/01/17 0415 09/01/17 0525 09/02/17 0415  NA 140 139  --   --  140  K 6.6* 7.4* 6.0* 4.4 4.9  CL 116* 117*  --   --  113*  CO2 18* 18*  --   --  24  BUN 115* 98*  --   --  34*  CREATININE 3.11* 2.29*  --   --  0.97  GLUCOSE 149* 127*  --   --  101*    Electrolytes  Recent Labs Lab 08/31/17 2220 09/01/17 0315 09/02/17 0415  CALCIUM 10.2 8.9 7.3*  MG  --   --  1.0*  PHOS  --   --  2.2*    CBC  Recent Labs Lab 08/31/17 1931 09/01/17 0315 09/02/17 0415  WBC 9.0 9.3 5.6  HGB 10.4* 8.1* 7.2*  HCT 32.2* 25.1* 22.4*  PLT 343 247 221    Coag's No results for input(s): APTT, INR in the last 168 hours.  Sepsis Markers No results for input(s): LATICACIDVEN, PROCALCITON, O2SATVEN in the  last 168 hours.  ABG No results for input(s): PHART, PCO2ART, PO2ART in the last 168 hours.  Liver Enzymes  Recent Labs Lab 08/31/17 1931 08/31/17 2220 09/02/17 0415  AST 26 26  --   ALT 8* 9*  --   ALKPHOS 61 40  --   BILITOT 0.7 0.3  --   ALBUMIN 3.1* 2.2* 2.0*    Cardiac Enzymes  Recent Labs Lab 08/31/17 1931  TROPONINI 0.05*    Glucose  Recent Labs Lab 09/01/17 0439 09/01/17 0628 09/01/17 1206 09/01/17 1626 09/01/17 2220 09/02/17 0724  GLUCAP 108* 124* 123* 106* 98 86    SIGNIFICANT EVENTS: 09/01/2017 admitted to the ICU with acute renal failure and severe hyperkalemia  LINES/TUBES: Peripheral IVs Right femoral dialysis catheter  DISCUSSION: 83 year old female presenting with oliguric acute renal failure, severe hypokalemia, severe dehydration, and hypovolemic shock  ASSESSMENT  Acute oliguric renal failure Severe hyperkalemia with EKG changes Severe dehydration Traumatic fall with multiple bruises-CT head negative Generalized pain secondary to fall and multiple injuries Current smoker History of: Hypertension, hyperlipidemia, Guillain Barr syndrome, congestive heart failure, COPD and DVT  PLAN Dynamic monitoring per SD protocol Nephrology following Hemodialysis per nephrology Trend BMP Pain management with as needed fentanyl and hydrocodone PT/ OT eval when stable IV fluids Care management consult for possible placement post discharge Pressors to maintain mean arterial blood pressure greater than 65 Resume all home medications except for antihypertensives in light of hypotension Hold Vasotec Spironolactone and calcium supplements per nephrology Follow-up renal ultrasound Smoking cessation advice given Nebulized bronchodilators GI and DVT prophylaxis  No changes in treatment plan pending clinical course and diagnosis patient is acutely ill and at high risk for clinical instability   Patient  satisfied with Plan of action and  management. All questions answered  Corrin Parker, M.D.  [facility] Pulmonary & Critical Care Medicine  Medical Director [place] Director [facility] Cardio-Pulmonary Department

## 2017-09-02 NOTE — Progress Notes (Signed)
OT Cancellation Note  Patient Details Name: Lindsey Hobbs MRN: [id number] DOB: [birth year]   Cancelled Treatment:    Reason Eval/Treat Not Completed: Medical issues which prohibited therapy. Order received, chart reviewed. Pt continues with low BP (93/55), HR at rest 110, R temporary femoral cath per chart, and Hgb 7.2, HCT 22.4. Will hold OT evaluation this date and re-attempt next date as medically appropriate.   Jeni Salles, MPH, MS, OTR/L ascom [phone] 09/02/17, 4:22 PM

## 2017-09-02 NOTE — Care Management Note (Signed)
Case Management Note  Patient Details  Name: Lindsey Hobbs MRN: [id number] Date of Birth: [birth year]  Subjective/Objective:                 Patient present from home after activating her med alert after falling at home. She is admitted to icu with acute renal failure that will require urgent dialysis.  Currently receiving home health nursing through [facility]. Patient will be evaluated daily regarding need for dialysis  Action/Plan: Notified [facility] of admission and Elvera Bicker with [facility]   Expected Discharge Date:                  Expected Discharge Plan:     In-House Referral:     Discharge planning Services     Post Acute Care Choice:    Choice offered to:     DME Arranged:    DME Agency:     HH Arranged:    [place] Agency:     Status of Service:     If discussed at [employer], dates discussed:    Additional Comments:  [name], Post[place], RN 09/02/2017, 8:52 AM

## 2017-09-02 NOTE — Progress Notes (Signed)
[facility] [place], [place] 09/02/17  Subjective:  Urine output appears to be better at 700 cc over the preceding 24 hours. Creatinine down to 0.97.  potassium also normal at 4.9.   Objective:  Vital signs in last 24 hours:  Temp:  [96.4 F (35.8 C)-98.4 F (36.9 C)] 98.1 F (36.7 C) (10/29 0630) Pulse Rate:  [81-91] 90 (10/29 0630) Resp:  [15-34] 19 (10/29 0630) BP: (78-128)/(40-73) 92/53 (10/29 0630) SpO2:  [91 %-100 %] 96 % (10/29 0630) FiO2 (%):  [28 %] 28 % (10/28 2006) Weight:  [56.3 kg (124 lb 1.9 oz)] 56.3 kg (124 lb 1.9 oz) (10/29 0500)  Weight change: 1.868 kg (4 lb 1.9 oz) Filed Weights   08/31/17 1927 09/02/17 0500  Weight: 54.4 kg (120 lb) 56.3 kg (124 lb 1.9 oz)    Intake/Output:    Intake/Output Summary (Last 24 hours) at 09/02/17 0901 Last data filed at 09/02/17 0600  Gross per 24 hour  Intake          5106.14 ml  Output              670 ml  Net          4436.14 ml     Physical Exam: General: Frail elderly, laying in the bed  HEENT oral mucous membranes dry,  Bruise on right forehead  Neck supple  Pulm/lungs normal breathing effort, coarse breath sounds bilaterally  CVS/Heart S1S2 no rubs  Abdomen:  soft, nondistended  Extremities: No peripheral edema  Neurologic: Awake, alert, following commands  Skin: Bruises over chest, forehead  Access: Right femoral temporary dialysis catheter 10/28        Basic Metabolic Panel:   Recent Labs Lab 08/31/17 1931 08/31/17 2220 09/01/17 0315 09/01/17 0415 09/01/17 0525 09/02/17 0415  NA 135 140 139  --   --  140  K >7.5* 6.6* 7.4* 6.0* 4.4 4.9  CL 109 116* 117*  --   --  113*  CO2 17* 18* 18*  --   --  24  GLUCOSE 134* 149* 127*  --   --  101*  BUN 123* 115* 98*  --   --  34*  CREATININE 3.59* 3.11* 2.29*  --   --  0.97  CALCIUM 10.3 10.2 8.9  --   --  7.3*  MG  --   --   --   --   --  1.0*  PHOS  --   --   --   --   --  2.2*     CBC:  Recent Labs Lab 08/31/17 1931  09/01/17 0315 09/02/17 0415  WBC 9.0 9.3 5.6  NEUTROABS 7.6*  --   --   HGB 10.4* 8.1* 7.2*  HCT 32.2* 25.1* 22.4*  MCV 97.8 97.7 96.4  PLT 343 247 221     No results found for: HEPBSAG, HEPBSAB, HEPBIGM    Microbiology:  Recent Results (from the past 240 hour(s))  MRSA PCR Screening     Status: None   Collection Time: 09/01/17  1:21 AM  Result Value Ref Range Status   MRSA by PCR NEGATIVE NEGATIVE Final    Comment:        The GeneXpert MRSA Assay (FDA approved for NASAL specimens only), is one component of a comprehensive MRSA colonization surveillance program. It is not intended to diagnose MRSA infection nor to guide or monitor treatment for MRSA infections.     Coagulation Studies: No results for input(s): LABPROT,  INR in the last 72 hours.  Urinalysis:  Recent Labs  08/31/17 1931  [place] 1.019  PHURINE 5.0  GLUCOSEU NEGATIVE  HGBUR NEGATIVE  BILIRUBINUR NEGATIVE  KETONESUR NEGATIVE  PROTEINUR NEGATIVE  NITRITE NEGATIVE  LEUKOCYTESUR NEGATIVE      Imaging: Ct Head Wo Contrast  Result Date: 08/31/2017 CLINICAL DATA:  [place] downstairs EXAM: CT HEAD WITHOUT CONTRAST CT CERVICAL SPINE WITHOUT CONTRAST TECHNIQUE: Multidetector CT imaging of the head and cervical spine was performed following the standard protocol without intravenous contrast. Multiplanar CT image reconstructions of the cervical spine were also generated. COMPARISON:  08/30/2009 FINDINGS: CT HEAD FINDINGS Brain: No acute territorial infarction, hemorrhage or intracranial mass is visualized. Mild atrophy. Mild small vessel ischemic changes of the white matter. Prominent ventricles felt related to atrophy Vascular: No hyperdense vessels.  Carotid artery calcifications. Skull: No fracture or suspicious lesion Sinuses/Orbits: No acute finding. Other: None CT CERVICAL SPINE FINDINGS Alignment: 4 mm anterolisthesis of C3 on C4, appears increased compared with radiographs from  2010. Trace anterolisthesis of C7 on T1 similar compared to prior. Facet alignment is maintained. Skull base and vertebrae: Craniovertebral junction is intact. There is no fracture. Soft tissues and spinal canal: No prevertebral fluid or swelling. No visible canal hematoma. Disc levels: Marked diffuse degenerative changes from C3 through C7 with disc space narrowing, endplate irregularity and osteophytes. Posterior disc osteophyte complex at C4-C5 and C5-C6. Upper chest: Mild apical emphysema.  Carotid artery calcification. Other: None IMPRESSION: 1. No CT evidence for acute intracranial abnormality. Atrophy and small vessel ischemic changes of the white matter 2. 4 mm anterolisthesis of C3 and C4, increased compared to prior radiographs from 2010. If ligamentous injury is a concern, further evaluation with MRI would be recommended. No fracture is seen. Electronically Signed   By: Donavan Foil M.D.   On: 08/31/2017 20:25   Ct Cervical Spine Wo Contrast  Result Date: 08/31/2017 CLINICAL DATA:  [place] downstairs EXAM: CT HEAD WITHOUT CONTRAST CT CERVICAL SPINE WITHOUT CONTRAST TECHNIQUE: Multidetector CT imaging of the head and cervical spine was performed following the standard protocol without intravenous contrast. Multiplanar CT image reconstructions of the cervical spine were also generated. COMPARISON:  08/30/2009 FINDINGS: CT HEAD FINDINGS Brain: No acute territorial infarction, hemorrhage or intracranial mass is visualized. Mild atrophy. Mild small vessel ischemic changes of the white matter. Prominent ventricles felt related to atrophy Vascular: No hyperdense vessels.  Carotid artery calcifications. Skull: No fracture or suspicious lesion Sinuses/Orbits: No acute finding. Other: None CT CERVICAL SPINE FINDINGS Alignment: 4 mm anterolisthesis of C3 on C4, appears increased compared with radiographs from 2010. Trace anterolisthesis of C7 on T1 similar compared to prior. Facet alignment is maintained. Skull  base and vertebrae: Craniovertebral junction is intact. There is no fracture. Soft tissues and spinal canal: No prevertebral fluid or swelling. No visible canal hematoma. Disc levels: Marked diffuse degenerative changes from C3 through C7 with disc space narrowing, endplate irregularity and osteophytes. Posterior disc osteophyte complex at C4-C5 and C5-C6. Upper chest: Mild apical emphysema.  Carotid artery calcification. Other: None IMPRESSION: 1. No CT evidence for acute intracranial abnormality. Atrophy and small vessel ischemic changes of the white matter 2. 4 mm anterolisthesis of C3 and C4, increased compared to prior radiographs from 2010. If ligamentous injury is a concern, further evaluation with MRI would be recommended. No fracture is seen. Electronically Signed   By: Donavan Foil M.D.   On: 08/31/2017 20:25   US Renal  Result Date:  09/01/2017 CLINICAL DATA:  83 year old female with renal failure and severe hyperlipidemia EXAM: RENAL / URINARY TRACT ULTRASOUND COMPLETE COMPARISON:  None. FINDINGS: Right Kidney: Length: 8.5 cm. There is mild increased renal echogenicity. There is no hydronephrosis or echogenic stone. Left Kidney: Length: 10 cm. Mild increased renal parenchyma echogenicity. No hydronephrosis or echogenic stone. Bladder: The urinary bladder is decompressed around a Foley catheter. IMPRESSION: Mild bilateral renal echogenicity likely related to underlying medical renal disease. Clinical correlation is recommended. No hydronephrosis or echogenic stone. Electronically Signed   By: Anner Crete M.D.   On: 09/01/2017 02:33   Dg Hip Unilat W Or Wo Pelvis 2-3 Views Left  Result Date: 08/31/2017 CLINICAL DATA:  83 year old female with fall and left hip pain. EXAM: DG HIP (WITH OR WITHOUT PELVIS) 2-3V LEFT COMPARISON:  None. FINDINGS: There is a total left hip arthroplasty. The orthopedic hardware appears intact and in anatomic alignment. There is no lucency surrounding the hardware to  suggest loosening. There is no acute fracture or dislocation. The bones are osteopenic. There are degenerative changes of the lower lumbar spine. Vascular calcification and a right iliac vascular stent noted. The soft tissues appear unremarkable. IMPRESSION: 1. No acute fracture or dislocation. 2. Left hip arthroplasty appears intact. Electronically Signed   By: Anner Crete M.D.   On: 08/31/2017 20:22     Medications:   . dextrose 5 % and 0.45% NaCl 125 mL/hr at 09/02/17 0600  . phenylephrine (NEO-SYNEPHRINE) Adult infusion 40 mcg/min (09/02/17 0846)   . aspirin EC  81 mg Oral Daily  . atorvastatin  10 mg Oral Daily  . budesonide  0.25 mg Nebulization BID  . calcium-vitamin D  1 tablet Oral Q breakfast  . clopidogrel  75 mg Oral Daily  . feeding supplement (ENSURE ENLIVE)  237 mL Oral BID BM  . ferrous sulfate  325 mg Oral Daily  . folic acid  1 mg Oral Daily  . gabapentin  200 mg Oral BID  . heparin  5,000 Units Subcutaneous Q8H  . ipratropium-albuterol  3 mL Nebulization Q6H  . mouth rinse  15 mL Mouth Rinse BID  . mesalamine  2.4 g Oral Q breakfast  . multivitamin with minerals  1 tablet Oral Daily  . omega-3 acid ethyl esters  1 g Oral Daily  . pantoprazole  40 mg Oral Daily  . sodium chloride flush  10-40 mL Intracatheter Q12H  . sodium chloride flush  3 mL Intravenous Q12H   acetaminophen **OR** acetaminophen, fentaNYL (SUBLIMAZE) injection, HYDROcodone-acetaminophen, loratadine, ondansetron **OR** ondansetron (ZOFRAN) IV, polyethylene glycol, sodium chloride flush, zolpidem  Assessment/ Plan:  83 y.o. female with medical problems of CHF (EF 35-40% from 06/2017),severe TR and MR, COPD, crohn's disease, DVT, HLD, HTN, h/o Hansel Feinstein who was admitted to [facility] on 08/31/2017 for evaluation post fall  1.  Acute renal failure, improved, required dialysis temporarily. 2.  Severe hyperkalemia, improved 3.  Severe hypotension and dehydration 4.  Hypercalcemia, resolved.    Plan: Renal function has improved. Creatinine down to 0.97. Urine output was 700 cc over the preceding 24 hours. Therefore no urgent indication for dialysis at the moment. Patient still relatively hypotensive but is awake and alert. Hyperkalemia also resolved now with a serum potassium of 4.9. Hold off on any further dialysis at the moment. We will continue to monitor her progress closely however.    LOS: 2 Gjon Letarte 10/29/20189:01 AM  [place], [place]

## 2017-09-02 NOTE — Evaluation (Signed)
Physical Therapy Evaluation Patient Details Name: Lindsey Hobbs MRN: [id number] DOB: June 01, 1927 Today's Date: 09/02/2017   History of Present Illness  Pt is an 83 year old Caucasian female with a past medical history that includes anemia, avascular necrosis of the hip, CHF, COPD, Crohn's Dz, DVT, Guillain Barre syndrome, HLD, and HTN who presented to the ED via EMS after a fall at home. Patient fell 5-6 flight of stairs. Her neighbors called EMS. At the ED, patient was found to be in acute renal failure creatinine level of 3.59 up from her baseline of 1.5. Her potassium level was critical, greater than 7.5, and her BUN was 123. She has multiple bruises from her forehead to her legs. Imaging studies are unremarkable. She has been seen by nephrology and is being admitted to the ICU for emergent hemodialysis.  Assessment includes: acute renal failure, hyperkalemia with EKG changes, dehydration, and traumatic fall.     Clinical Impression  Pt's Hg 7.2, HCT 22.4, BP 92/53 with nursing giving ok with those values for gentle activity with pt and attempt to get pt from bed to recliner.  Pt presents with deficits in strength, transfers, mobility, gait, balance, and activity tolerance.  Pt required min A with bed mobility tasks and transfers with significantly increased time and effort for all tasks.  Pt felt "a little woozy" upon sitting at EOB but quickly resolved with no other instances of adverse symptoms during session.  Nursing at bedside to assist with pt sit to/from stand transfer and short amb to recliner to manage lines and leads.  Pt's steps to recliner were slow and effortful with min A required for stability and to help guide the RW.  SpO2 on 2.5LO2/min 92-96% during session with HR increasing from 90 bpm to 102 bpm after activity.  Pt will benefit from PT services in a SNF setting upon discharge to safely address above deficits for decreased caregiver assistance and eventual return to  [facility].      Follow Up Recommendations SNF    Equipment Recommendations  None recommended by PT    Recommendations for Other Services       Precautions / Restrictions Precautions Precautions: Fall Restrictions Weight Bearing Restrictions: No      Mobility  Bed Mobility Overal bed mobility: Needs Assistance Bed Mobility: Supine to Sit     Supine to sit: Min assist     General bed mobility comments: Min A with trunk and LEs for upright sitting at EOB  Transfers Overall transfer level: Needs assistance Equipment used: Rolling walker (2 wheeled) Transfers: Sit to/from Stand Sit to Stand: Min assist         General transfer comment: Min A for stability with mod verbal cues for sequencing  Ambulation/Gait Ambulation/Gait assistance: Min assist Ambulation Distance (Feet): 2 Feet Assistive device: Rolling walker (2 wheeled) Gait Pattern/deviations: Trunk flexed;Step-through pattern;Decreased step length - right;Decreased step length - left   Gait velocity interpretation: <1.8 ft/sec, indicative of risk for recurrent falls General Gait Details: Several very small steps to recliner with min A for stability and to guide RW  Stairs            Wheelchair Mobility    Modified Rankin (Stroke Patients Only)       Balance Overall balance assessment: Needs assistance Sitting-balance support: Feet unsupported;Bilateral upper extremity supported Sitting balance-Leahy Scale: Fair     Standing balance support: Bilateral upper extremity supported Standing balance-Leahy Scale: Poor Standing balance comment: Min A for stability in standing  Pertinent Vitals/Pain Pain Assessment: No/denies pain    Home Living Family/patient expects to be discharged to:: Private residence (Per patient, pt and son working on finding ALF for pt to eventually move into but likely will not be ready prior to hospital discharge) Living Arrangements:  Alone Available Help at Discharge: Family;Available PRN/intermittently Type of Home: House Home Access: Stairs to enter Entrance Stairs-Rails: Right;Left;Can reach both Entrance Stairs-Number of Steps: 4 Home Layout: One level Home Equipment: Walker - 2 wheels;Cane - single point      Prior Function Level of Independence: Independent with assistive device(s)         Comments: Pt reports Mod I with amb with SPC, Ind with ADLs, no other falls other than current fall.  Pt reports fell secondary to sock slipping on step while descending steps out of home.     Hand Dominance   Dominant Hand: Right    Extremity/Trunk Assessment   Upper Extremity Assessment Upper Extremity Assessment: Generalized weakness    Lower Extremity Assessment Lower Extremity Assessment: Generalized weakness       Communication   Communication: No difficulties  Cognition Arousal/Alertness: Awake/alert Behavior During Therapy: WFL for tasks assessed/performed Overall Cognitive Status: Within Functional Limits for tasks assessed                                        General Comments      Exercises Total Joint Exercises Ankle Circles/Pumps: AROM;Both;5 reps;10 reps Quad Sets: Strengthening;Both;10 reps Gluteal Sets: Strengthening;Both;10 reps Long Arc Quad: AROM;Both;10 reps;15 reps Knee Flexion: AROM;Both;10 reps;15 reps Other Exercises Other Exercises: Seated core therex with unsupported sitting at EOB x 4 min   Assessment/Plan    PT Assessment Patient needs continued PT services  PT Problem List Decreased strength;Decreased activity tolerance;Decreased balance;Decreased knowledge of use of DME;Decreased mobility       PT Treatment Interventions DME instruction;Gait training;Stair training;Functional mobility training;Neuromuscular re-education;Balance training;Therapeutic exercise;Therapeutic activities;Patient/family education    PT Goals (Current goals can be found  in the Care Plan section)  Acute Rehab PT Goals Patient Stated Goal: To get stronger PT Goal Formulation: With patient Time For Goal Achievement: 09/15/17 Potential to Achieve Goals: Good    Frequency Min 2X/week   Barriers to discharge Inaccessible home environment      Co-evaluation               AM-PAC PT "6 Clicks" Daily Activity  Outcome Measure Difficulty turning over in bed (including adjusting bedclothes, sheets and blankets)?: Unable Difficulty moving from lying on back to sitting on the side of the bed? : Unable Difficulty sitting down on and standing up from a chair with arms (e.g., wheelchair, bedside commode, etc,.)?: Unable Help needed moving to and from a bed to chair (including a wheelchair)?: A Lot Help needed walking in hospital room?: Total Help needed climbing 3-5 steps with a railing? : Total 6 Click Score: 7    End of Session Equipment Utilized During Treatment: Oxygen;Gait belt Activity Tolerance: Patient limited by fatigue Patient left: in chair;with call bell/phone within reach;with nursing/sitter in room Nurse Communication: Mobility status PT Visit Diagnosis: Difficulty in walking, not elsewhere classified (R26.2);Muscle weakness (generalized) (M62.81)    Time: 0940-1010 PT Time Calculation (min) (ACUTE ONLY): 30 min   Charges:   PT Evaluation $PT Eval Moderate Complexity: 1 Mod PT Treatments $Therapeutic Exercise: 8-22 mins   PT G Codes:  PT G-Codes **NOT FOR INPATIENT CLASS** Functional Assessment Tool Used: AM-PAC 6 Clicks Basic Mobility Functional Limitation: Mobility: Walking and moving around Mobility: Walking and Moving Around Current Status (N3614): At least 80 percent but less than 100 percent impaired, limited or restricted Mobility: Walking and Moving Around Goal Status [phone]): At least 20 percent but less than 40 percent impaired, limited or restricted    D. Scott Khoen Genet PT, DPT 09/02/17, 11:25 AM

## 2017-09-02 NOTE — Progress Notes (Signed)
Initial Nutrition Assessment  DOCUMENTATION CODES:   Non-severe (moderate) malnutrition in context of chronic illness  INTERVENTION:  Continue Ensure Enlive po BID, each supplement provides 350 kcal and 20 grams of protein. Patient enjoys chocolate flavor.  Continue daily MVI.  Encouraged adequate intake of calories and protein at meals. Encouraged patient to try to choose a protein source at each meal.  NUTRITION DIAGNOSIS:   Moderate Malnutrition related to chronic illness (COPD, CHF) as evidenced by mild fat depletion, mild muscle depletion.  GOAL:   Patient will meet greater than or equal to 90% of their needs  MONITOR:   PO intake, Supplement acceptance, Labs, Weight trends, Skin, I & O's  REASON FOR ASSESSMENT:   Rounds    ASSESSMENT:   83 year old female with PMHx of HTN, COPD, anemia, HLD, Guillain Barre syndrome, Crohn's disease, hx DVT, CHF who presented after falling down 5-8 steps at home found to have multiple bruises and skin tears, ARF with acute severe hyperkalemia s/p emergent HD on 10/28, hypotension, dehydration.   Met with patient at bedside. She was up sitting in chair. She reports her appetite is somewhat decreased, but it has been that way for a long time now and she only eats small amounts at meals. She typically lives home alone and prepares meals herself. For breakfast she likes to have a banana and another piece of fruit. For an afternoon meal she eats a microwave dinner that has meat and vegetables. She also drinks chocolate Boost 1-2 times daily at home. She endorses eating some dinner and breakfast this morning. She was able to find foods she enjoys on the menu. Reports she does not like eggs, milk, or yogurt. Patient denies any N/V, abdominal pain, difficulty chewing. She does endorse difficulty swallowing some pills. She reported she did not know when her last BM was, but noted she had a small BM yesterday.  Patient unsure of her exact UBW, but she  believes she has not lost any weight. Limited weight history in chart. Patient has been 109-117 lbs this past year, so unsure whether the current weight or earlier weights in chart are accurate.  Medications reviewed and include: Oscal with D 1 tablet daily, ferrous sulfate 229 mg daily, folic acid 1 mg daily, MVI daily, Lovaza 1 gram daily, pantoprazole, D5-1/2NS @ 125 mL/hr (150 grams dextrose, 510 kcal daily), magnesium sulfate 2 grams IV once today.  Labs reviewed: CBG 86-123 past 24 hrs, Chloride 113, BUN 34, Phosphorus 2.2, Magnesium 1. Potassium now WNL at 4.9 (was 7.4 on 10/28).  I/O: 700 mL UOP yesterday (0.5 mL/kg/hr); 1 BM yesterday  Discussed with RN. Yesterday patient was only taking bites/sips. Ensure was ordered for patient. Last night she had 50% of dinner and also ate some breakfast this morning. More alert today.  NUTRITION - FOCUSED PHYSICAL EXAM:    Most Recent Value  Orbital Region  Mild depletion  Upper Arm Region  Mild depletion  Thoracic and Lumbar Region  Mild depletion  Buccal Region  Mild depletion  Temple Region  Mild depletion  Clavicle Bone Region  No depletion  Clavicle and Acromion Bone Region  No depletion  Scapular Bone Region  No depletion  Dorsal Hand  Mild depletion  Patellar Region  Mild depletion  Anterior Thigh Region  Mild depletion  Posterior Calf Region  Mild depletion  Edema (RD Assessment)  None  Hair  Reviewed  Eyes  Reviewed  Mouth  Reviewed [poor dentition]  Skin  Reviewed [  noted bruising and skin tears]  Nails  Reviewed       Diet Order:  Diet Heart Room service appropriate? Yes; Fluid consistency: Thin  EDUCATION NEEDS:   No education needs have been identified at this time  Skin:  Skin Assessment: Skin Integrity Issues: Skin Integrity Issues:: Other (Comment) Other: MSAD to anus; skin tears to bilateral lower extremities  Last BM:  09/01/2017 - small type 6  Height:   Ht Readings from Last 1 Encounters:  08/31/17 5'  4" (1.626 m)    Weight:   Wt Readings from Last 1 Encounters:  09/02/17 124 lb 1.9 oz (56.3 kg)    Ideal Body Weight:  54.5 kg  BMI:  Body mass index is 21.3 kg/m.  Estimated Nutritional Needs:   Kcal:  1410-1690 (25-30 kcal/kg)  Protein:  70-80 grams (1.2-1.4 grams/kg)  Fluid:  1.4 L/day (25 mL/kg)  Willey Blade, MS, RD, LDN Office: [phone] Pager: [phone] After Hours/Weekend Pager: [phone]

## 2017-09-02 NOTE — Progress Notes (Signed)
Pharmacy Electroltye Monitoring   83 y/o F with a h/o CHF is admitted with acute renal failure.  Sodium (mmol/L)  Date Value  09/02/2017 140  03/02/2013 143   Potassium (mmol/L)  Date Value  09/02/2017 4.9  03/02/2013 3.7   Magnesium (mg/dL)  Date Value  09/02/2017 1.0 (L)  03/02/2013 1.8   Phosphorus (mg/dL)  Date Value  09/02/2017 2.2 (L)   Calcium (mg/dL)  Date Value  09/02/2017 7.3 (L)   Calcium, Total (mg/dL)  Date Value  03/02/2013 8.6   Albumin (g/dL)  Date Value  09/02/2017 2.0 (L)  02/27/2013 2.6 (L)   Will replace magnesium conservatively with magnesium sulfate 2 g iv once due to ARF and recheck this PM. Will not replace phos at this time but will recheck tomorrow.   Ulice Dash, PharmD Clinical Pharmacist

## 2017-09-02 NOTE — Progress Notes (Signed)
[place] [facility] [place] NAME: Lindsey Hobbs    MR#:  [id number]  DATE OF BIRTH:  May 18, 1927  SUBJECTIVE:  Appears very frail.  Patient more awake alert doing well She is off Neo-Synephrine drip  REVIEW OF SYSTEMS:   Review of Systems  Unable to perform ROS: Medical condition  Constitutional: Negative for chills, fever and weight loss.  HENT: Negative for ear discharge, ear pain and nosebleeds.   Eyes: Negative for blurred vision, pain and discharge.  Respiratory: Negative for sputum production, shortness of breath, wheezing and stridor.   Cardiovascular: Negative for chest pain, palpitations, orthopnea and PND.  Gastrointestinal: Negative for abdominal pain, diarrhea, nausea and vomiting.  Genitourinary: Negative for frequency and urgency.  Musculoskeletal: Negative for back pain and joint pain.  Neurological: Positive for weakness. Negative for sensory change, speech change and focal weakness.  Psychiatric/Behavioral: Negative for depression and hallucinations. The patient is not nervous/anxious.    Tolerating Diet:poor Tolerating PT: Rehab DRUG ALLERGIES:  No Known Allergies  VITALS:  Blood pressure (!) 93/55, pulse (!) 110, temperature 97.7 F (36.5 C), resp. rate (!) 21, height 5\' 4"  (1.626 m), weight 56.3 kg (124 lb 1.9 oz), SpO2 99 %.  PHYSICAL EXAMINATION:   Physical Exam  GENERAL:  83 y.o.-year-old patient lying in the bed with no acute distress. Thin,cahectic EYES: Pupils equal, round, reactive to light and accommodation. No scleral icterus. Extraocular muscles intact.  HEENT: Head atraumatic, normocephalic.  Oral mucosa dry ,bruise over the right forehead NECK:  Supple, no jugular venous distention. No thyroid enlargement, no tenderness.  LUNGS: Normal breath sounds bilaterally, no wheezing, rales, rhonchi. No use of accessory muscles of respiration.  CARDIOVASCULAR: S1, S2 normal. No murmurs, rubs, or gallops.   ABDOMEN: Soft, nontender, nondistended. Bowel sounds present. No organomegaly or mass.  EXTREMITIES: No cyanosis, clubbing or edema b/l.    NEUROLOGIC: Unable to assess.  No focal weakness.[name]   PSYCHIATRIC:  patient is alert and weak SKIN: No obvious rash, lesion, or ulcer.   LABORATORY PANEL:  CBC  Recent Labs Lab 09/02/17 0415  WBC 5.6  HGB 7.2*  HCT 22.4*  PLT 221    Chemistries   Recent Labs Lab 08/31/17 2220  09/02/17 0415  NA 140  < > 140  K 6.6*  < > 4.9  CL 116*  < > 113*  CO2 18*  < > 24  GLUCOSE 149*  < > 101*  BUN 115*  < > 34*  CREATININE 3.11*  < > 0.97  CALCIUM 10.2  < > 7.3*  MG  --   --  1.0*  AST 26  --   --   ALT 9*  --   --   ALKPHOS 40  --   --   BILITOT 0.3  --   --   < > = values in this interval not displayed. Cardiac Enzymes  Recent Labs Lab 08/31/17 1931  TROPONINI 0.05*   RADIOLOGY:  Ct Head Wo Contrast  Result Date: 08/31/2017 CLINICAL DATA:  [address] downstairs EXAM: CT HEAD WITHOUT CONTRAST CT CERVICAL SPINE WITHOUT CONTRAST TECHNIQUE: Multidetector CT imaging of the head and cervical spine was performed following the standard protocol without intravenous contrast. Multiplanar CT image reconstructions of the cervical spine were also generated. COMPARISON:  08/30/2009 FINDINGS: CT HEAD FINDINGS Brain: No acute territorial infarction, hemorrhage or intracranial mass is visualized. Mild atrophy. Mild small vessel ischemic changes of the white matter. Prominent ventricles felt  related to atrophy Vascular: No hyperdense vessels.  Carotid artery calcifications. Skull: No fracture or suspicious lesion Sinuses/Orbits: No acute finding. Other: None CT CERVICAL SPINE FINDINGS Alignment: 4 mm anterolisthesis of C3 on C4, appears increased compared with radiographs from 2010. Trace anterolisthesis of C7 on T1 similar compared to prior. Facet alignment is maintained. Skull base and vertebrae: Craniovertebral junction is intact. There is no fracture. Soft  tissues and spinal canal: No prevertebral fluid or swelling. No visible canal hematoma. Disc levels: Marked diffuse degenerative changes from C3 through C7 with disc space narrowing, endplate irregularity and osteophytes. Posterior disc osteophyte complex at C4-C5 and C5-C6. Upper chest: Mild apical emphysema.  Carotid artery calcification. Other: None IMPRESSION: 1. No CT evidence for acute intracranial abnormality. Atrophy and small vessel ischemic changes of the white matter 2. 4 mm anterolisthesis of C3 and C4, increased compared to prior radiographs from 2010. If ligamentous injury is a concern, further evaluation with MRI would be recommended. No fracture is seen. Electronically Signed   By: Donavan Foil M.D.   On: 08/31/2017 20:25   Ct Cervical Spine Wo Contrast  Result Date: 08/31/2017 CLINICAL DATA:  [address] downstairs EXAM: CT HEAD WITHOUT CONTRAST CT CERVICAL SPINE WITHOUT CONTRAST TECHNIQUE: Multidetector CT imaging of the head and cervical spine was performed following the standard protocol without intravenous contrast. Multiplanar CT image reconstructions of the cervical spine were also generated. COMPARISON:  08/30/2009 FINDINGS: CT HEAD FINDINGS Brain: No acute territorial infarction, hemorrhage or intracranial mass is visualized. Mild atrophy. Mild small vessel ischemic changes of the white matter. Prominent ventricles felt related to atrophy Vascular: No hyperdense vessels.  Carotid artery calcifications. Skull: No fracture or suspicious lesion Sinuses/Orbits: No acute finding. Other: None CT CERVICAL SPINE FINDINGS Alignment: 4 mm anterolisthesis of C3 on C4, appears increased compared with radiographs from 2010. Trace anterolisthesis of C7 on T1 similar compared to prior. Facet alignment is maintained. Skull base and vertebrae: Craniovertebral junction is intact. There is no fracture. Soft tissues and spinal canal: No prevertebral fluid or swelling. No visible canal hematoma. Disc levels:  Marked diffuse degenerative changes from C3 through C7 with disc space narrowing, endplate irregularity and osteophytes. Posterior disc osteophyte complex at C4-C5 and C5-C6. Upper chest: Mild apical emphysema.  Carotid artery calcification. Other: None IMPRESSION: 1. No CT evidence for acute intracranial abnormality. Atrophy and small vessel ischemic changes of the white matter 2. 4 mm anterolisthesis of C3 and C4, increased compared to prior radiographs from 2010. If ligamentous injury is a concern, further evaluation with MRI would be recommended. No fracture is seen. Electronically Signed   By: Donavan Foil M.D.   On: 08/31/2017 20:25   US Renal  Result Date: 09/01/2017 CLINICAL DATA:  83 year old female with renal failure and severe hyperlipidemia EXAM: RENAL / URINARY TRACT ULTRASOUND COMPLETE COMPARISON:  None. FINDINGS: Right Kidney: Length: 8.5 cm. There is mild increased renal echogenicity. There is no hydronephrosis or echogenic stone. Left Kidney: Length: 10 cm. Mild increased renal parenchyma echogenicity. No hydronephrosis or echogenic stone. Bladder: The urinary bladder is decompressed around a Foley catheter. IMPRESSION: Mild bilateral renal echogenicity likely related to underlying medical renal disease. Clinical correlation is recommended. No hydronephrosis or echogenic stone. Electronically Signed   By: Anner Crete M.D.   On: 09/01/2017 02:33   Dg Hip Unilat W Or Wo Pelvis 2-3 Views Left  Result Date: 08/31/2017 CLINICAL DATA:  83 year old female with fall and left hip pain. EXAM: DG HIP (WITH OR WITHOUT PELVIS)  2-3V LEFT COMPARISON:  None. FINDINGS: There is a total left hip arthroplasty. The orthopedic hardware appears intact and in anatomic alignment. There is no lucency surrounding the hardware to suggest loosening. There is no acute fracture or dislocation. The bones are osteopenic. There are degenerative changes of the lower lumbar spine. Vascular calcification and a right  iliac vascular stent noted. The soft tissues appear unremarkable. IMPRESSION: 1. No acute fracture or dislocation. 2. Left hip arthroplasty appears intact. Electronically Signed   By: Anner Crete M.D.   On: 08/31/2017 20:22   ASSESSMENT AND PLAN:  Lindsey Hobbs  is a 83 y.o. female with below past medical history presenting from home status post a fall as patient was trying to ambulate up her stairs with assist device, lost her balance and fell 5-8 steps, in the emergency room patient primarily complained of left hip pain, noted multiple areas of bruising over entire body, mild abrasions which were treated, noted hypotension with systolic blood pressure in the 80s, hemoglobin 8.4, potassium was greater than 7.5  1 acute renal failure with acute severe hyperkalemia Most likely secondary to multifactorial process which includes acute dehydration/decreased p.o. intake and medication side effect from diuretics/ACE inhibitor use -Appreciate nephrology consultation.  Patient was emergently dialyzed yesterday.  Potassium is down to 4.0 -She is oliguric. -Even with creatinine of 10.0---down 0.9 -Urine output slowly picking up.  No further indication for dialysis -Baseline creatinine 0.96 in July 2018  2 acute hyperkalemia--resolved Secondary to above In the ER,treated with dextrose IV, IV insulin, sodium bicarb IV, normal saline solution fluid bolus in the emergency room Continue plan of care as stated above  3 acute hypotension Most likely secondary to acute dehydration IV fluids for rehydration, hold antihypertensives, vitals per routine -Currently is off Neo-Synephrine drip  4  generalized weakness/deconditioning -PT recommends rehab  5 COPD without exacerbation Stable Breathing treatments as needed, Pulmicort twice daily  6 chronic Crohn's disease Without exacerbation Stable  Discussed with ICU attending Dr. Lyndel Safe.  He will transfer patient out to medical floor Case discussed  with Care Management/Social Worker. Management plans discussed with the patient, family and they are in agreement.  CODE STATUS: DNR  DVT Prophylaxis: Heparin  TOTAL TIME TAKING CARE OF THIS PATIENT: 25 minutes.  >50% time spent on counselling and coordination of care  POSSIBLE D/C IN *few DAYS, DEPENDING ON CLINICAL CONDITION.  Note: This dictation was prepared with Dragon dictation along with smaller phrase technology. Any transcriptional errors that result from this process are unintentional.  Ashtan Laton M.D on 09/02/2017 at 2:58 PM  Between 7am to 6pm - Pager - [phone]  After 6pm go to [email or web address] - password [email or web address] [facility]  Office  [phone]  CC: Primary care physician; Cletis Athens, MD

## 2017-09-03 ENCOUNTER — Ambulatory Visit: Payer: Medicare Other | Admitting: Podiatry

## 2017-09-03 LAB — BASIC METABOLIC PANEL WITH GFR
Anion gap: 2 — ABNORMAL LOW (ref 5–15)
BUN: 27 mg/dL — ABNORMAL HIGH (ref 6–20)
CO2: 24 mmol/L (ref 22–32)
Calcium: 7.9 mg/dL — ABNORMAL LOW (ref 8.9–10.3)
Chloride: 111 mmol/L (ref 101–111)
Creatinine, Ser: 0.86 mg/dL (ref 0.44–1.00)
GFR calc Af Amer: >60 mL/min
GFR calc non Af Amer: 59 mL/min — ABNORMAL LOW
Glucose, Bld: 94 mg/dL (ref 65–99)
Potassium: 5.4 mmol/L — ABNORMAL HIGH (ref 3.5–5.1)
Sodium: 137 mmol/L (ref 135–145)

## 2017-09-03 LAB — MAGNESIUM
Magnesium: 1.6 mg/dL — ABNORMAL LOW (ref 1.7–2.4)
Magnesium: 2 mg/dL (ref 1.7–2.4)

## 2017-09-03 LAB — PHOSPHORUS
Phosphorus: 1.7 mg/dL — ABNORMAL LOW (ref 2.5–4.6)
Phosphorus: 2.1 mg/dL — ABNORMAL LOW (ref 2.5–4.6)

## 2017-09-03 LAB — POTASSIUM: Potassium: 5.3 mmol/L — ABNORMAL HIGH (ref 3.5–5.1)

## 2017-09-03 LAB — GLUCOSE, CAPILLARY
GLUCOSE-CAPILLARY: 102 mg/dL — AB (ref 65–99)
Glucose-Capillary: 99 mg/dL (ref 65–99)
Glucose-Capillary: 128 mg/dL — ABNORMAL HIGH (ref 65–99)

## 2017-09-03 MED ORDER — MAGNESIUM SULFATE 2 GM/50ML IV SOLN
2.0000 g | Freq: Once | INTRAVENOUS | Status: AC
  Administered 2017-09-03: 2 g via INTRAVENOUS
  Filled 2017-09-03: qty 50

## 2017-09-03 MED ORDER — SODIUM PHOSPHATES 45 MMOLE/15ML IV SOLN
10.0000 mmol | Freq: Once | INTRAVENOUS | Status: AC
  Administered 2017-09-03: 10 mmol via INTRAVENOUS
  Filled 2017-09-03: qty 3.33

## 2017-09-03 MED ORDER — SODIUM POLYSTYRENE SULFONATE 15 GM/60ML PO SUSP: 15.0000 g | Freq: Once | ORAL | Status: DC

## 2017-09-03 MED ORDER — K PHOS MONO-SOD PHOS DI & MONO 155-852-130 MG PO TABS
500.0000 mg | ORAL_TABLET | ORAL | Status: DC
  Administered 2017-09-03 – 2017-09-04 (×3): 500 mg via ORAL
  Filled 2017-09-03 (×4): qty 2

## 2017-09-03 MED ORDER — IPRATROPIUM-ALBUTEROL 0.5-2.5 (3) MG/3ML IN SOLN
3.0000 mL | Freq: Four times a day (QID) | RESPIRATORY_TRACT | Status: DC | PRN
  Administered 2017-09-03 – 2017-09-04 (×2): 3 mL via RESPIRATORY_TRACT
  Filled 2017-09-03 (×2): qty 3

## 2017-09-03 NOTE — Progress Notes (Signed)
Pharmacy Electroltye Monitoring   83 y/o F with a h/o CHF is admitted with acute renal failure.  Sodium (mmol/L)  Date Value  09/03/2017 137  03/02/2013 143   Potassium (mmol/L)  Date Value  09/03/2017 5.4 (H)  03/02/2013 3.7   Magnesium (mg/dL)  Date Value  09/03/2017 1.6 (L)  03/02/2013 1.8   Phosphorus (mg/dL)  Date Value  09/03/2017 1.7 (L)   Calcium (mg/dL)  Date Value  09/03/2017 7.9 (L)   Calcium, Total (mg/dL)  Date Value  03/02/2013 8.6   Albumin (g/dL)  Date Value  09/02/2017 2.0 (L)  02/27/2013 2.6 (L)   Mg and phos replaced by CCM. Will recheck at 1800.   Ulice Dash, PharmD Clinical Pharmacist

## 2017-09-03 NOTE — Progress Notes (Signed)
Pharmacy Electroltye Monitoring   83 y/o F with a h/o CHF is admitted with acute renal failure.  Sodium (mmol/L)  Date Value  09/03/2017 137  03/02/2013 143   Potassium (mmol/L)  Date Value  09/03/2017 5.3 (H)  03/02/2013 3.7   Magnesium (mg/dL)  Date Value  09/03/2017 2.0  03/02/2013 1.8   Phosphorus (mg/dL)  Date Value  09/03/2017 2.1 (L)   Calcium (mg/dL)  Date Value  09/03/2017 7.9 (L)   Calcium, Total (mg/dL)  Date Value  03/02/2013 8.6   Albumin (g/dL)  Date Value  09/02/2017 2.0 (L)  02/27/2013 2.6 (L)   Mg and phos replaced by CCM. Will recheck at 1800.   10/30 1758 Phos 2.1, Mg 2. Will give K Phos Neutral 500 mg po Q4H x 4 doses per protocol, and recheck electrolytes tomorrow with AM labs.  Laloni Rowton A. Jordan Hawks, PharmD Clinical Pharmacist

## 2017-09-03 NOTE — Progress Notes (Signed)
OT Cancellation Note  Patient Details Name: Lindsey Hobbs MRN: [id number] DOB: February 20, 1927   Cancelled Treatment:    Reason Eval/Treat Not Completed: Medical issues which prohibited therapy. Continuing to follow for appropriateness of OT evaluation. Pt potassium this morning up to 5.4. No updated hemoglobin and HCT from previous date's values which were low (Hgb 7.2, HCT 22.4). Contraindicated for OT at this time. Will continue to follow acutely and re-attempt OT evaluation at later date/time as medically appropriate.  Jeni Salles, MPH, MS, OTR/L ascom [phone] 09/03/17, 9:40 AM

## 2017-09-03 NOTE — Progress Notes (Signed)
OT Cancellation Note  Patient Details Name: Lindsey Hobbs MRN: [id number] DOB: [birth year]   Cancelled Treatment:    Reason Eval/Treat Not Completed: Medical issues which prohibited therapy. Potassium continues to be elevated (5.3). Per critical care MD, pt to transfer to medsurg floor this date. Given critical lab value contraindicating therapy and pending floor transfer, will hold and re-attempt OT evaluation next date as pt is medically appropriate.  Jeni Salles, MPH, MS, OTR/L ascom [phone] 09/03/17, 4:29 PM

## 2017-09-03 NOTE — Progress Notes (Signed)
[facility] [facility] [place] NAME: Lindsey Hobbs    MR#:  [id number]  DATE OF BIRTH:  1926-12-15  SUBJECTIVE:  Appears very frail.  Patient more awake alert doing well She is off Neo-Synephrine drip  REVIEW OF SYSTEMS:   Review of Systems  Unable to perform ROS: Medical condition  Constitutional: Negative for chills, fever and weight loss.  HENT: Negative for ear discharge, ear pain and nosebleeds.   Eyes: Negative for blurred vision, pain and discharge.  Respiratory: Negative for sputum production, shortness of breath, wheezing and stridor.   Cardiovascular: Negative for chest pain, palpitations, orthopnea and PND.  Gastrointestinal: Negative for abdominal pain, diarrhea, nausea and vomiting.  Genitourinary: Negative for frequency and urgency.  Musculoskeletal: Negative for back pain and joint pain.  Neurological: Positive for weakness. Negative for sensory change, speech change and focal weakness.  Psychiatric/Behavioral: Negative for depression and hallucinations. The patient is not nervous/anxious.    Tolerating Diet:poor Tolerating PT: Rehab DRUG ALLERGIES:   Allergies  Allergen Reactions  . Ace Inhibitors     Severe hyperkalemia and AKI    VITALS:  Blood pressure (!) 105/51, pulse (!) 104, temperature 99 F (37.2 C), resp. rate 20, height 5\' 4"  (1.626 m), weight 57.4 kg (126 lb 8.7 oz), SpO2 95 %.  PHYSICAL EXAMINATION:   Physical Exam  GENERAL:  83 y.o.-year-old patient lying in the bed with no acute distress. Thin,cahectic EYES: Pupils equal, round, reactive to light and accommodation. No scleral icterus. Extraocular muscles intact.  HEENT: Head atraumatic, normocephalic.  Oral mucosa dry ,bruise over the right forehead NECK:  Supple, no jugular venous distention. No thyroid enlargement, no tenderness.  LUNGS: Normal breath sounds bilaterally, no wheezing, rales, rhonchi. No use of accessory muscles of respiration.   CARDIOVASCULAR: S1, S2 normal. No murmurs, rubs, or gallops.  ABDOMEN: Soft, nontender, nondistended. Bowel sounds present. No organomegaly or mass.  EXTREMITIES: No cyanosis, clubbing or edema b/l.    NEUROLOGIC: Unable to assess.  No focal weakness.[name]   PSYCHIATRIC:  patient is alert and weak SKIN: No obvious rash, lesion, or ulcer.   LABORATORY PANEL:  CBC  Recent Labs Lab 09/02/17 0415  WBC 5.6  HGB 7.2*  HCT 22.4*  PLT 221    Chemistries   Recent Labs Lab 08/31/17 2220  09/03/17 0353 09/03/17 1044  NA 140  < > 137  --   K 6.6*  < > 5.4* 5.3*  CL 116*  < > 111  --   CO2 18*  < > 24  --   GLUCOSE 149*  < > 94  --   BUN 115*  < > 27*  --   CREATININE 3.11*  < > 0.86  --   CALCIUM 10.2  < > 7.9*  --   MG  --   < > 1.6*  --   AST 26  --   --   --   ALT 9*  --   --   --   ALKPHOS 40  --   --   --   BILITOT 0.3  --   --   --   < > = values in this interval not displayed. Cardiac Enzymes  Recent Labs Lab 08/31/17 1931  TROPONINI 0.05*   RADIOLOGY:  No results found. ASSESSMENT AND PLAN:  Lindsey Hobbs  is a 83 y.o. female with below past medical history presenting from home status post a fall as patient was trying  to ambulate up her stairs with assist device, lost her balance and fell 5-8 steps, in the emergency room patient primarily complained of left hip pain, noted multiple areas of bruising over entire body, mild abrasions which were treated, noted hypotension with systolic blood pressure in the 80s, hemoglobin 8.4, potassium was greater than 7.5  1 acute renal failure with acute severe hyperkalemia Most likely secondary to multifactorial process which includes acute dehydration/decreased p.o. intake and medication side effect from diuretics/ACE inhibitor use -Appreciate nephrology consultation.  Patient was emergently dialyzed yesterday.  Potassium is down to 4.0 -She is oliguric. -Even with creatinine of 10.0---down 0.9 -Urine output slowly picking up.  No  further indication for dialysis -Baseline creatinine 0.96 in July 2018  2 acute hyperkalemia--resolved Secondary to above In the ER,treated with dextrose IV, IV insulin, sodium bicarb IV, normal saline solution fluid bolus in the emergency room Continue plan of care as stated above  3 acute hypotension Most likely secondary to acute dehydration IV fluids for rehydration, hold antihypertensives, vitals per routine -Currently is off Neo-Synephrine drip  4  generalized weakness/deconditioning -PT recommends rehab  5 COPD without exacerbation Stable Breathing treatments as needed, Pulmicort twice daily  6 chronic Crohn's disease Without exacerbation Stable  7.  Hypomagnesemia hypophosphatemia -Electrolytes to be replaced by pharmacy   Patient will discharge to rehab   Case discussed with Care Management/Social Worker. Management plans discussed with the patient, family and they are in agreement.  CODE STATUS: DNR  DVT Prophylaxis: Heparin  TOTAL TIME TAKING CARE OF THIS PATIENT: 25 minutes.  >50% time spent on counselling and coordination of care  POSSIBLE D/C IN 1-2DAYS, DEPENDING ON CLINICAL CONDITION.  Note: This dictation was prepared with Dragon dictation along with smaller phrase technology. Any transcriptional errors that result from this process are unintentional.  [name] M.D on 09/03/2017 at 3:56 PM  Between 7am to 6pm - Pager - [phone]  After 6pm go to [email or web address] - password [email or web address] [facility]  Office  [phone]  CC: Primary care physician; Cletis Athens, MD

## 2017-09-03 NOTE — Clinical Social Work Note (Signed)
Clinical Social Work Assessment  Patient Details  Name: Lindsey Hobbs MRN: [id number] Date of Birth: October 29, 1927  Date of referral:  09/03/17               Reason for consult:  Facility Placement                Permission sought to share information with:  Facility Sport and exercise psychologist, Family Supports Permission granted to share information::  Yes, Verbal Permission Granted  Name::        Agency::     Relationship::     Contact Information:     Housing/Transportation Living arrangements for the past 2 months:  Single Family Home Source of Information:  Patient Patient Interpreter Needed:  None Criminal Activity/Legal Involvement Pertinent to Current Situation/Hospitalization:  No - Comment as needed Significant Relationships:  Adult Children Lives with:  Self Do you feel safe going back to the place where you live?  Yes Need for family participation in patient care:  No (Coment)  Care giving concerns:  Patient is independent at baseline from home with home health.   Social Worker assessment / plan:  CSW met with patient this afternoon and explained role and purpose of visit. CSW explained PT recommendations for STR. Patient stated that she was in agreement with this and that she had been to [facility] in the past. She stated that she would like to go there again if possible. CSW has begun bed search.  Employment status:  Retired Forensic scientist:  Information systems manager, Medicaid In [place] PT Recommendations:  [place] / Referral to community resources:     Patient/Family's Response to care:  Patient expressed appreciation for CSW visit.  Patient/Family's Understanding of and Emotional Response to Diagnosis, Current Treatment, and Prognosis:  Patient is aware of her limitations and in agreement with STR.  Emotional Assessment Appearance:  Appears stated age Attitude/Demeanor/Rapport:   (pleasant and cooperative) Affect (typically observed):   Accepting, Adaptable, Calm Orientation:  Oriented to Self, Oriented to  Time, Oriented to Place, Oriented to Situation Alcohol / Substance use:  Not Applicable Psych involvement (Current and /or in the community):  No (Comment)  Discharge Needs  Concerns to be addressed:  Care Coordination Readmission within the last 30 days:  No Current discharge risk:  None Barriers to Discharge:  No Barriers Identified   Shela Leff, LCSW 09/03/2017, 2:33 PM

## 2017-09-03 NOTE — Progress Notes (Signed)
No complaints No distress  Vitals:   09/03/17 0700 09/03/17 0800 09/03/17 0900 09/03/17 1030  BP: 108/64 (!) 109/56 (!) 107/56 (!) 105/51  Pulse: (!) 102 97 98 (!) 104  Resp: (!) 21 17 19 20   Temp: 98.4 F (36.9 C) 98.4 F (36.9 C) 98.6 F (37 C) 99 F (37.2 C)  TempSrc:  Rectal    SpO2: 95% 94% 95% 95%  Weight:      Height:        NAD HEENT forehead contusion No JVD Diminished BS, no wheezes Regular, no M NABS, soft Extremities warm, no edema No focal neurologic deficits  BMP Latest Ref Rng & Units 09/03/2017 09/03/2017 09/02/2017  Glucose 65 - 99 mg/dL - 94 101(H)  BUN 6 - 20 mg/dL - 27(H) 34(H)  Creatinine 0.44 - 1.00 mg/dL - 0.86 0.97  Sodium 135 - 145 mmol/L - 137 140  Potassium 3.5 - 5.1 mmol/L 5.3(H) 5.4(H) 4.9  Chloride 101 - 111 mmol/L - 111 113(H)  CO2 22 - 32 mmol/L - 24 24  Calcium 8.9 - 10.3 mg/dL - 7.9(L) 7.3(L)    CBC Latest Ref Rng & Units 09/02/2017 09/01/2017 08/31/2017  WBC 3.6 - 11.0 K/uL 5.6 9.3 9.0  Hemoglobin 12.0 - 16.0 g/dL 7.2(L) 8.1(L) 10.4(L)  Hematocrit 35.0 - 47.0 % 22.4(L) 25.1(L) 32.2(L)  Platelets 150 - 440 K/uL 221 247 343    No new CXR  IMPRESSION: AKI Hyperkalemia - remains minimally elevated Volume depletion - resolved Recent fall with forehead contusion Smoker  PLAN/REC: No further dialysis planned Remove HD catheter Would henceforth avoid all ACE inhibitors and ARB's Counseled regarding smoking cessation Transfer to MedSurg  To transfer, PCCM will sign off. Please call if we can be of further assistance    Merton Border, MD PCCM service Mobile [phone] Pager [phone] 09/03/2017 2:32 PM

## 2017-09-03 NOTE — NC FL2 (Signed)
[place] LEVEL OF CARE SCREENING TOOL     IDENTIFICATION  Patient Name: Lindsey Hobbs Birthdate: May 11, 1927 Sex: female Admission Date (Current Location): 08/31/2017  [place] and [place] Number:  Engineering geologist and Address:  [facility], [address], [place], [place] [address]      Provider Number: [id number]  Attending Physician Name and Address:  Fritzi Mandes, MD  Relative Name and Phone Number:       Current Level of Care: Hospital Recommended Level of Care: [place] Prior Approval Number:    Date Approved/Denied:   PASRR Number: [id number]  Discharge Plan: SNF    Current Diagnoses: Patient Active Problem List   Diagnosis Date Noted  . Back pain   . Hyperkalemia   . Fall   . Acute renal failure (ARF) ([place]) 08/31/2017  . Hyperlipidemia 08/02/2017  . Atherosclerosis of native arteries of the extremities with ulceration ([place]) 08/02/2017  . Chronic systolic heart failure ([place]) 07/04/2017  . HTN (hypertension) 07/04/2017  . Tobacco use 07/04/2017  . COPD exacerbation ([place])   . Palliative care by specialist   . Goals of care, counseling/discussion   . Chronic deep vein thrombosis (DVT) of right lower extremity ([place]) 06/18/2017  . Dyspnea 06/18/2017  . Demand ischemia ([place]) 06/18/2017  . Tobacco abuse counseling 06/18/2017  . Right leg DVT ([place]) 06/18/2017  . Ulcerative colitis ([place])   . Anemia 06/04/2017  . Rectal bleed 06/02/2017  . GI bleed 07/05/2015    Orientation RESPIRATION BLADDER Height & Weight     Self, Situation, Place  Normal Continent Weight: 126 lb 8.7 oz (57.4 kg) Height:  5\' 4"  (162.6 cm)  BEHAVIORAL SYMPTOMS/MOOD NEUROLOGICAL BOWEL NUTRITION STATUS   (none)  (none) Continent Diet (cardiac)  AMBULATORY STATUS COMMUNICATION OF NEEDS Skin   Extensive Assist Verbally Bruising, Skin abrasions                       Personal Care Assistance Level of Assistance  Dressing,  Bathing Bathing Assistance: Maximum assistance   Dressing Assistance: Maximum assistance     Functional Limitations Info  Sight, Hearing Sight Info: Impaired Hearing Info: Impaired      SPECIAL CARE FACTORS FREQUENCY  PT (By licensed PT)                    Contractures Contractures Info: Not present    Additional Factors Info  Code Status, Allergies Code Status Info: dnr Allergies Info: ace inhibitors           Current Medications (09/03/2017):  This is the current hospital active medication list Current Facility-Administered Medications  Medication Dose Route Frequency Provider Last Rate Last Dose  . acetaminophen (TYLENOL) tablet 650 mg  650 mg Oral Q6H PRN Salary, Montell D, MD      . aspirin EC tablet 81 mg  81 mg Oral Daily Salary, Montell D, MD   81 mg at 09/03/17 1038  . atorvastatin (LIPITOR) tablet 10 mg  10 mg Oral Daily Salary, Montell D, MD   10 mg at 09/02/17 1717  . calcium-vitamin D (OSCAL WITH D) 500-200 MG-UNIT per tablet 1 tablet  1 tablet Oral Q breakfast Salary, Montell D, MD   1 tablet at 09/03/17 1041  . clopidogrel (PLAVIX) tablet 75 mg  75 mg Oral Daily Salary, Montell D, MD   75 mg at 09/03/17 1038  . dextrose 5 %-0.45 % sodium chloride infusion  Intravenous Continuous Fritzi Mandes, MD 50 mL/hr at 09/02/17 2253    . feeding supplement (ENSURE ENLIVE) (ENSURE ENLIVE) liquid 237 mL  237 mL Oral BID BM Flora Lipps, MD   237 mL at 09/01/17 1045  . ferrous sulfate tablet 325 mg  325 mg Oral Daily Salary, Montell D, MD   325 mg at 09/03/17 1040  . folic acid (FOLVITE) tablet 1 mg  1 mg Oral Daily Salary, Montell D, MD   1 mg at 09/03/17 1040  . gabapentin (NEURONTIN) capsule 200 mg  200 mg Oral BID Dorene Sorrow S, NP   200 mg at 09/03/17 1039  . heparin injection 5,000 Units  5,000 Units Subcutaneous Q8H Salary, Montell D, MD   5,000 Units at 09/03/17 1045  . HYDROcodone-acetaminophen (NORCO/VICODIN) 5-325 MG per tablet 1-2 tablet  1-2 tablet  Oral Q6H PRN Mikael Spray, NP   1 tablet at 09/01/17 0817  . ipratropium-albuterol (DUONEB) 0.5-2.5 (3) MG/3ML nebulizer solution 3 mL  3 mL Nebulization Q6H PRN Wilhelmina Mcardle, MD      . loratadine (CLARITIN) tablet 10 mg  10 mg Oral Daily PRN Salary, Montell D, MD      . MEDLINE mouth rinse  15 mL Mouth Rinse BID Tukov, Magadalene S, NP   15 mL at 09/02/17 1000  . mesalamine (LIALDA) EC tablet 2.4 g  2.4 g Oral Q breakfast Salary, Montell D, MD   2.4 g at 09/03/17 1042  . multivitamin with minerals tablet 1 tablet  1 tablet Oral Daily Salary, Montell D, MD   1 tablet at 09/03/17 1045  . omega-3 acid ethyl esters (LOVAZA) capsule 1 g  1 g Oral Daily Salary, Montell D, MD   1 g at 09/03/17 1045  . ondansetron (ZOFRAN) injection 4 mg  4 mg Intravenous Q6H PRN Salary, Montell D, MD   4 mg at 09/01/17 1351  . pantoprazole (PROTONIX) EC tablet 40 mg  40 mg Oral Daily Salary, Montell D, MD   40 mg at 09/03/17 1038  . polyethylene glycol (MIRALAX / GLYCOLAX) packet 17 g  17 g Oral Daily PRN Salary, Montell D, MD      . sodium chloride flush (NS) 0.9 % injection 3 mL  3 mL Intravenous Q12H Salary, Montell D, MD   3 mL at 09/03/17 1040  . zolpidem (AMBIEN) tablet 5 mg  5 mg Oral QHS PRN Mikael Spray, NP   5 mg at 09/02/17 2239     Discharge Medications: Please see discharge summary for a list of discharge medications.  Relevant Imaging Results:  Relevant Lab Results:   Additional Information ss: [id number]  Shela Leff, LCSW

## 2017-09-04 DIAGNOSIS — K50919 Crohn's disease, unspecified, with unspecified complications: Secondary | ICD-10-CM | POA: Diagnosis not present

## 2017-09-04 DIAGNOSIS — I248 Other forms of acute ischemic heart disease: Secondary | ICD-10-CM | POA: Diagnosis not present

## 2017-09-04 DIAGNOSIS — I82541 Chronic embolism and thrombosis of right tibial vein: Secondary | ICD-10-CM | POA: Diagnosis not present

## 2017-09-04 DIAGNOSIS — E875 Hyperkalemia: Secondary | ICD-10-CM | POA: Diagnosis not present

## 2017-09-04 DIAGNOSIS — I110 Hypertensive heart disease with heart failure: Secondary | ICD-10-CM | POA: Diagnosis not present

## 2017-09-04 DIAGNOSIS — F17200 Nicotine dependence, unspecified, uncomplicated: Secondary | ICD-10-CM | POA: Diagnosis not present

## 2017-09-04 DIAGNOSIS — M6281 Muscle weakness (generalized): Secondary | ICD-10-CM | POA: Diagnosis not present

## 2017-09-04 DIAGNOSIS — J449 Chronic obstructive pulmonary disease, unspecified: Secondary | ICD-10-CM | POA: Diagnosis not present

## 2017-09-04 DIAGNOSIS — Z79899 Other long term (current) drug therapy: Secondary | ICD-10-CM | POA: Diagnosis not present

## 2017-09-04 DIAGNOSIS — I10 Essential (primary) hypertension: Secondary | ICD-10-CM | POA: Diagnosis not present

## 2017-09-04 DIAGNOSIS — G610 Guillain-Barre syndrome: Secondary | ICD-10-CM | POA: Diagnosis not present

## 2017-09-04 DIAGNOSIS — R0600 Dyspnea, unspecified: Secondary | ICD-10-CM | POA: Diagnosis not present

## 2017-09-04 DIAGNOSIS — I2510 Atherosclerotic heart disease of native coronary artery without angina pectoris: Secondary | ICD-10-CM | POA: Diagnosis not present

## 2017-09-04 DIAGNOSIS — Z86718 Personal history of other venous thrombosis and embolism: Secondary | ICD-10-CM | POA: Diagnosis not present

## 2017-09-04 DIAGNOSIS — K219 Gastro-esophageal reflux disease without esophagitis: Secondary | ICD-10-CM | POA: Diagnosis not present

## 2017-09-04 DIAGNOSIS — R0902 Hypoxemia: Secondary | ICD-10-CM | POA: Diagnosis not present

## 2017-09-04 DIAGNOSIS — J441 Chronic obstructive pulmonary disease with (acute) exacerbation: Secondary | ICD-10-CM | POA: Diagnosis not present

## 2017-09-04 DIAGNOSIS — I5020 Unspecified systolic (congestive) heart failure: Secondary | ICD-10-CM | POA: Diagnosis not present

## 2017-09-04 DIAGNOSIS — D649 Anemia, unspecified: Secondary | ICD-10-CM | POA: Diagnosis not present

## 2017-09-04 DIAGNOSIS — R0602 Shortness of breath: Secondary | ICD-10-CM | POA: Diagnosis not present

## 2017-09-04 DIAGNOSIS — Z7401 Bed confinement status: Secondary | ICD-10-CM | POA: Diagnosis not present

## 2017-09-04 DIAGNOSIS — R7989 Other specified abnormal findings of blood chemistry: Secondary | ICD-10-CM | POA: Diagnosis not present

## 2017-09-04 DIAGNOSIS — J90 Pleural effusion, not elsewhere classified: Secondary | ICD-10-CM | POA: Diagnosis not present

## 2017-09-04 DIAGNOSIS — N179 Acute kidney failure, unspecified: Secondary | ICD-10-CM | POA: Diagnosis not present

## 2017-09-04 DIAGNOSIS — Z96642 Presence of left artificial hip joint: Secondary | ICD-10-CM | POA: Diagnosis not present

## 2017-09-04 DIAGNOSIS — F17210 Nicotine dependence, cigarettes, uncomplicated: Secondary | ICD-10-CM | POA: Diagnosis not present

## 2017-09-04 DIAGNOSIS — Z66 Do not resuscitate: Secondary | ICD-10-CM | POA: Diagnosis not present

## 2017-09-04 DIAGNOSIS — N19 Unspecified kidney failure: Secondary | ICD-10-CM | POA: Diagnosis not present

## 2017-09-04 DIAGNOSIS — I5022 Chronic systolic (congestive) heart failure: Secondary | ICD-10-CM | POA: Diagnosis not present

## 2017-09-04 DIAGNOSIS — E785 Hyperlipidemia, unspecified: Secondary | ICD-10-CM | POA: Diagnosis not present

## 2017-09-04 DIAGNOSIS — K5090 Crohn's disease, unspecified, without complications: Secondary | ICD-10-CM | POA: Diagnosis not present

## 2017-09-04 DIAGNOSIS — R4182 Altered mental status, unspecified: Secondary | ICD-10-CM | POA: Diagnosis not present

## 2017-09-04 DIAGNOSIS — S81802A Unspecified open wound, left lower leg, initial encounter: Secondary | ICD-10-CM | POA: Diagnosis not present

## 2017-09-04 DIAGNOSIS — G4700 Insomnia, unspecified: Secondary | ICD-10-CM | POA: Diagnosis not present

## 2017-09-04 DIAGNOSIS — J189 Pneumonia, unspecified organism: Secondary | ICD-10-CM | POA: Diagnosis not present

## 2017-09-04 DIAGNOSIS — Z7982 Long term (current) use of aspirin: Secondary | ICD-10-CM | POA: Diagnosis not present

## 2017-09-04 LAB — BASIC METABOLIC PANEL
Anion gap: 5 (ref 5–15)
BUN: 24 mg/dL — AB (ref 6–20)
CALCIUM: 8.4 mg/dL — AB (ref 8.9–10.3)
CHLORIDE: 111 mmol/L (ref 101–111)
CO2: 23 mmol/L (ref 22–32)
Creatinine, Ser: 0.86 mg/dL (ref 0.44–1.00)
GFR calc Af Amer: >60 mL/min (ref >60)
GFR, EST NON AFRICAN AMERICAN: 59 mL/min — AB (ref >60)
GLUCOSE: 96 mg/dL (ref 65–99)
POTASSIUM: 5.4 mmol/L — AB (ref 3.5–5.1)
Sodium: 139 mmol/L (ref 135–145)

## 2017-09-04 LAB — PHOSPHORUS: Phosphorus: 3.2 mg/dL (ref 2.5–4.6)

## 2017-09-04 LAB — MAGNESIUM: Magnesium: 1.9 mg/dL (ref 1.7–2.4)

## 2017-09-04 MED ORDER — SODIUM POLYSTYRENE SULFONATE 15 GM/60ML PO SUSP
15.0000 g | Freq: Once | ORAL | Status: AC
  Administered 2017-09-04: 15 g via ORAL
  Filled 2017-09-04: qty 60

## 2017-09-04 MED ORDER — ENSURE ENLIVE PO LIQD: 237.0000 mL | Freq: Two times a day (BID) | ORAL | 12 refills | Status: AC

## 2017-09-04 MED ORDER — UMECLIDINIUM-VILANTEROL 62.5-25 MCG/INH IN AEPB
1.0000 | INHALATION_SPRAY | Freq: Every day | RESPIRATORY_TRACT | Status: DC
  Administered 2017-09-04: 1 via RESPIRATORY_TRACT
  Filled 2017-09-04: qty 14

## 2017-09-04 NOTE — Progress Notes (Signed)
Report called to Angela Walker, LPN at [facility] 

## 2017-09-04 NOTE — Discharge Summary (Signed)
[facility] [place] NAME: Lindsey Hobbs    MR#:  [id number]  DATE OF BIRTH:  06/16/1927  DATE OF ADMISSION:  08/31/2017 ADMITTING PHYSICIAN: Gorden Harms, MD  DATE OF DISCHARGE: 09/04/2017  PRIMARY CARE PHYSICIAN: Cletis Athens, MD    ADMISSION DIAGNOSIS:  Renal failure [N19]  DISCHARGE DIAGNOSIS:  Acute renal failure secondary to prerenal azotemia from poor p.o. intake now resolved Hyperkalemia Hypophosphatemia hypomagnesemia repleted Hypotension--improved Generalized weakness deconditioning  SECONDARY DIAGNOSIS:   Past Medical History:  Diagnosis Date  . Anemia   . Avascular necrosis of bone of hip ([place])   . CHF (congestive heart failure) ([place])   . COPD (chronic obstructive pulmonary disease) ([place])   . Crohn's disease ([place])   . DVT (deep venous thrombosis) ([place]) 06/2017  . Guillain Barr syndrome ([place])   . Hyperlipidemia   . Hypertension     HOSPITAL COURSE:  [name] below past medical history presenting from home status post a fall as patient was trying to ambulate up her stairs with assist device, lost her balance and fell 5-8 steps, in the emergency room patient primarily complained of left hip pain, noted multiple areas of bruising over entire body, mild abrasions which were treated, noted hypotension with systolic blood pressure in the 80s, hemoglobin 8.4, potassium was greater than 7.5  1acute renal failure with acute severe hyperkalemia Most likely secondary to multifactorial process which includes acute dehydration/decreased p.o. intake and medication side effect from diuretics/ACE inhibitor use -Appreciate nephrology consultation.  Patient was emergently dialyzed .  Potassium is down to 4.0--4.3--5.4--5.3--5.4 -Patient received 1 dose of Kayexalate today prior to discharge.  She had several doses of K-Phos given by pharmacy for her low phosphorus -MD Director at [facility] to check metabolic panel ensure potassium comes down -Even with creatinine of 10.0---down 0.9 -Urine output slowly picking up.  No further indication for dialysis -Baseline creatinine 0.96 in July 2018  2acute hyperkalemia Secondary to above In the ER,treated with dextrose IV, IV insulin, sodium bicarb IV, normal saline solution fluid bolus in the emergency room Continue plan of care as stated above -Patient received a dose of Kayexalate today on October 31.  Check metabolic panel in 1-2 days ensure K stable  3acute hypotension Most likely secondary to acute dehydration Received IV fluids for rehydration, hold antihypertensives -C patient required Neo-Synephrine drip  4 generalized weakness/deconditioning -PT recommends rehab  5 COPD without exacerbation Stable Breathing treatments as needed, Pulmicort twice daily  6chronic Crohn's disease Without exacerbation Stable  7.  Hypomagnesemia hypophosphatemia -Electrolytes to be replaced by pharmacy Overall improving. Patient is a no code DNR Discuss discharge with ICU attending Dr. Rosita Hobbs Patient's son has chosen [facility] I am exhausted with physical therapy CONSULTS OBTAINED:    DRUG ALLERGIES:   Allergies  Allergen Reactions  . Ace Inhibitors     Severe hyperkalemia and AKI    DISCHARGE MEDICATIONS:   Current Discharge Medication List    START taking these medications   Details  feeding supplement, ENSURE ENLIVE, (ENSURE ENLIVE) LIQD Take 237 mLs by mouth 2 (two) times daily between meals. Qty: 237 mL, Refills: 12      CONTINUE these medications which have NOT CHANGED   Details  Calcium Carbonate-Vitamin D (CALCIUM 600+D) 600-400 MG-UNIT per tablet Take 1 tablet by mouth 2 (two) times daily.    ferrous sulfate 325 (65 FE) MG tablet Take 1 tablet (325 mg total) by  mouth 2 (two) times daily with a meal. Qty: 180 tablet, Refills: 0    fexofenadine (ALLEGRA) 180 MG tablet Take 180 mg by  mouth at bedtime.    folic acid (FOLVITE) 1 MG tablet Take 1 mg by mouth daily.    gabapentin (NEURONTIN) 100 MG capsule Take 200 mg by mouth. One in the morning and 2 at night    mesalamine (LIALDA) 1.2 g EC tablet Take 2.4 g by mouth daily with breakfast.     Multiple Vitamin (MULTIVITAMIN) tablet Take 1 tablet by mouth daily.    Omega-3 Fatty Acids (FISH OIL PO) Take 1 capsule by mouth daily.     zolpidem (AMBIEN) 10 MG tablet Take 10 mg by mouth at bedtime as needed for sleep.    atorvastatin (LIPITOR) 10 MG tablet Take 1 tablet (10 mg total) by mouth daily. Qty: 30 tablet, Refills: 11    ipratropium-albuterol (DUONEB) 0.5-2.5 (3) MG/3ML SOLN Take 3 mLs by nebulization every 6 (six) hours as needed. Qty: 360 mL, Refills: 0    loratadine (CLARITIN) 10 MG tablet Take 10 mg by mouth daily as needed for allergies.    pantoprazole (PROTONIX) 40 MG tablet Take 40 mg by mouth daily.      STOP taking these medications     aspirin EC 81 MG tablet      budesonide (PULMICORT) 0.25 MG/2ML nebulizer solution      carvedilol (COREG) 3.125 MG tablet      clopidogrel (PLAVIX) 75 MG tablet      enalapril (VASOTEC) 2.5 MG tablet      furosemide (LASIX) 20 MG tablet      guaiFENesin (MUCINEX) 600 MG 12 hr tablet      spironolactone (ALDACTONE) 25 MG tablet         If you experience worsening of your admission symptoms, develop shortness of breath, life threatening emergency, suicidal or homicidal thoughts you must seek medical attention immediately by calling 911 or calling your MD immediately  if symptoms less severe.  You Must read complete instructions/literature along with all the possible adverse reactions/side effects for all the Medicines you take and that have been prescribed to you. Take any new Medicines after you have completely understood and accept all the possible adverse reactions/side effects.   Please note  You were cared for by a hospitalist during your hospital  stay. If you have any questions about your discharge medications or the care you received while you were in the hospital after you are discharged, you can call the unit and asked to speak with the hospitalist on call if the hospitalist that took care of you is not available. Once you are discharged, your primary care physician will handle any further medical issues. Please note that NO REFILLS for any discharge medications will be authorized once you are discharged, as it is imperative that you return to your primary care physician (or establish a relationship with a primary care physician if you do not have one) for your aftercare needs so that they can reassess your need for medications and monitor your lab values. Today   SUBJECTIVE   I am exhausted today VITAL SIGNS:  Blood pressure (!) 96/54, pulse 89, temperature 97.8 F (36.6 C), temperature source Axillary, resp. rate 17, height 5\' 4"  (1.626 m), weight 57.4 kg (126 lb 8.7 oz), SpO2 100 %.  I/O:   Intake/Output Summary (Last 24 hours) at 09/04/17 1325 Last data filed at 09/04/17 0859  Gross per 24 hour  Intake              950 ml  Output             1500 ml  Net             -550 ml    PHYSICAL EXAMINATION:  GENERAL:  83 y.o.-year-old patient lying in the bed with no acute distress.  Thin,frail EYES: Pupils equal, round, reactive to light and accommodation. No scleral icterus. Extraocular muscles intact.  HEENT: Head atraumatic, normocephalic. Oropharynx and nasopharynx clear.  Bruise over the right side of the forehead  NECK:  Supple, no jugular venous distention. No thyroid enlargement, no tenderness.  LUNGS: Normal breath sounds bilaterally, no wheezing, rales,rhonchi or crepitation. No use of accessory muscles of respiration.  CARDIOVASCULAR: S1, S2 normal. No murmurs, rubs, or gallops.  ABDOMEN: Soft, non-tender, non-distended. Bowel sounds present. No organomegaly or mass.  EXTREMITIES: No pedal edema, cyanosis, or clubbing.   NEUROLOGIC: Moves all extremities well PSYCHIATRIC: The patient is alert and oriented x 3.  SKIN: No obvious rash, lesion, or ulcer.   DATA REVIEW:   CBC   Recent Labs Lab 09/02/17 0415  WBC 5.6  HGB 7.2*  HCT 22.4*  PLT 221    Chemistries   Recent Labs Lab 08/31/17 2220  09/04/17 0450  NA 140  < > 139  K 6.6*  < > 5.4*  CL 116*  < > 111  CO2 18*  < > 23  GLUCOSE 149*  < > 96  BUN 115*  < > 24*  CREATININE 3.11*  < > 0.86  CALCIUM 10.2  < > 8.4*  MG  --   < > 1.9  AST 26  --   --   ALT 9*  --   --   ALKPHOS 40  --   --   BILITOT 0.3  --   --   < > = values in this interval not displayed.  Microbiology Results   Recent Results (from the past 240 hour(s))  Urine culture     Status: None   Collection Time: 08/31/17  7:31 PM  Result Value Ref Range Status   Specimen Description URINE, RANDOM  Final   Special Requests NONE  Final   Culture   Final    NO GROWTH Performed at [facility], [address], [place], [place] [address]    Report Status 09/02/2017 FINAL  Final  MRSA PCR Screening     Status: None   Collection Time: 09/01/17  1:21 AM  Result Value Ref Range Status   MRSA by PCR NEGATIVE NEGATIVE Final    Comment:        The GeneXpert MRSA Assay (FDA approved for NASAL specimens only), is one component of a comprehensive MRSA colonization surveillance program. It is not intended to diagnose MRSA infection nor to guide or monitor treatment for MRSA infections.     RADIOLOGY:  No results found.   Management plans discussed with the patient, family and they are in agreement.  CODE STATUS:     Code Status Orders        Start     Ordered   09/01/17 0813  Do not attempt resuscitation (DNR)  Continuous    Question Answer Comment  In the event of cardiac or respiratory ARREST Do not call a "code blue"   In the event of cardiac or respiratory ARREST Do not perform Intubation, CPR, defibrillation or ACLS  In the event of cardiac  or respiratory ARREST Use medication by any route, position, wound care, and other measures to relive pain and suffering. May use oxygen, suction and manual treatment of airway obstruction as needed for comfort.      09/01/17 2951    Code Status History    Date Active Date Inactive Code Status Order ID Comments User Context   09/01/2017  1:25 AM 09/01/2017  8:12 AM Full Code [id number]  Gorden Harms, MD Inpatient   08/08/2017 12:12 PM 08/08/2017  5:25 PM Full Code [id number]  Algernon Huxley, MD Inpatient   06/18/2017  3:38 PM 06/24/2017  7:37 PM DNR [id number]  Theodoro Grist, MD ED   06/01/2017  3:00 PM 06/06/2017  5:42 PM DNR [id number]  Baxter Hire, MD Inpatient   07/05/2015  5:33 PM 07/08/2015  8:17 PM DNR [id number]  Vaughan Basta, MD Inpatient    Advance Directive Documentation     Most Recent Value  Type of Advance Directive  Healthcare Power of [place], Out of facility DNR (pink MOST or yellow form)  Pre-existing out of facility DNR order (yellow form or pink MOST form)  Yellow form placed in chart (order not valid for inpatient use)  "MOST" Form in Place?  -      TOTAL TIME TAKING CARE OF THIS PATIENT: *40* minutes.    Kaly Mcquary M.D on 09/04/2017 at 1:25 PM  Between 7am to 6pm - Pager - [phone] After 6pm go to [email or web address] - password [email or web address] [facility]  Office  [phone]  CC: Primary care physician; Cletis Athens, MD

## 2017-09-04 NOTE — Clinical Social Work Placement (Signed)
   CLINICAL SOCIAL WORK PLACEMENT  NOTE  Date:  09/04/2017  Patient Details  Name: Lindsey Hobbs MRN: [id number] Date of Birth: 1927/07/13  Clinical Social Work is seeking post-discharge placement for this patient at the [place] level of care (*CSW will initial, date and re-position this form in  chart as items are completed):  Yes   Patient/family provided with [place] Work Department's list of facilities offering this level of care within the geographic area requested by the patient (or if unable, by the patient's family).  Yes   Patient/family informed of their freedom to choose among providers that offer the needed level of care, that participate in Medicare, Medicaid or managed care program needed by the patient, have an available bed and are willing to accept the patient.  Yes   Patient/family informed of [facility]'s ownership interest in [facility] and [facility], as well as of the fact that they are under no obligation to receive care at these facilities.  PASRR submitted to EDS on       PASRR number received on       Existing PASRR number confirmed on 09/03/17     FL2 transmitted to all facilities in geographic area requested by pt/family on 09/03/17     FL2 transmitted to all facilities within larger geographic area on       Patient informed that his/her managed care company has contracts with or will negotiate with certain facilities, including the following:        Yes   Patient/family informed of bed offers received.  Patient chooses bed at  [facility])     Physician recommends and patient chooses bed at  [facility])    Patient to be transferred to  [facility]) on 09/04/17.  Patient to be transferred to facility by  (EMS)     Patient family notified on 09/04/17 of transfer.  Name of family member notified:   (patient's son)     PHYSICIAN       Additional Comment:     _______________________________________________ Shela Leff, LCSW 09/04/2017, 12:27 PM

## 2017-09-04 NOTE — Progress Notes (Signed)
Physical Therapy Treatment Patient Details Name: Lindsey Hobbs MRN: [id number] DOB: 09/04/1927 Today's Date: 09/04/2017    History of Present Illness Pt is an 83 year old Caucasian female with a past medical history that includes anemia, avascular necrosis of the hip, CHF, COPD, Crohn's Dz, DVT, Guillain Barre syndrome, HLD, and HTN who presented to the ED via EMS after a fall at home. Patient fell 5-6 flight of stairs. Her neighbors called EMS. At the ED, patient was found to be in acute renal failure creatinine level of 3.59 up from her baseline of 1.5. Her potassium level was critical, greater than 7.5, and her BUN was 123. She has multiple bruises from her forehead to her legs. Imaging studies are unremarkable. She has been seen by nephrology and is being admitted to the ICU for emergent hemodialysis.  Assessment includes: acute renal failure, hyperkalemia with EKG changes, dehydration, and traumatic fall.     PT Comments    Pt is able to complete exercises in supine and sitting at EOB with therapist today. Deferred transfers or ambulation today due to elevated potassium, elevated resting HR, and fatigue. She demonstrates good motivation with therapist and VSS throughout session. Pt will benefit from PT services to address deficits in strength, balance, and mobility in order to return to full function at home.     Follow Up Recommendations  SNF     Equipment Recommendations  None recommended by PT    Recommendations for Other Services       Precautions / Restrictions Precautions Precautions: Fall Restrictions Weight Bearing Restrictions: No    Mobility  Bed Mobility Overal bed mobility: Needs Assistance Bed Mobility: Supine to Sit;Sit to Supine     Supine to sit: Min assist Sit to supine: Min assist   General bed mobility comments: Pt requires minA1 for trunk and LEs for both phases of bed mobility. Increased time required   Transfers                  General transfer comment: Deferred transfers and ambulation at this time due to elevated K+, elevated resting HR, and subjective fatigue by patient  Ambulation/Gait                 Stairs            Wheelchair Mobility    Modified Rankin (Stroke Patients Only)       Balance Overall balance assessment: Needs assistance Sitting-balance support: No upper extremity supported Sitting balance-Leahy Scale: Good                                      Cognition Arousal/Alertness: Awake/alert Behavior During Therapy: WFL for tasks assessed/performed Overall Cognitive Status: Within Functional Limits for tasks assessed                                        Exercises General Exercises - Lower Extremity Ankle Circles/Pumps: AROM;Both;10 reps;Supine Quad Sets: Strengthening;Both;10 reps;Supine Gluteal Sets: Strengthening;Both;10 reps;Supine Short Arc Quad: Strengthening;Both;10 reps;Supine Long Arc Quad: Strengthening;Both;10 reps;Seated Heel Slides: Strengthening;Both;10 reps;Supine;Other (comment) (Also performed x 10 in sitting) Hip ABduction/ADduction: Strengthening;Both;10 reps;Supine;Other (comment) (x 10 in sitting as well) Straight Leg Raises: Strengthening;Both;15 reps;Supine Hip Flexion/Marching: Strengthening;Both;10 reps;Seated    General Comments        Pertinent Vitals/Pain Pain Assessment: No/denies pain  Home Living                      Prior Function            PT Goals (current goals can now be found in the care plan section) Acute Rehab PT Goals Patient Stated Goal: To get stronger PT Goal Formulation: With patient Time For Goal Achievement: 09/15/17 Potential to Achieve Goals: Good Progress towards PT goals: Progressing toward goals    Frequency    Min 2X/week      PT Plan Current plan remains appropriate    Co-evaluation              AM-PAC PT "6 Clicks" Daily Activity   Outcome Measure  Difficulty turning over in bed (including adjusting bedclothes, sheets and blankets)?: Unable Difficulty moving from lying on back to sitting on the side of the bed? : Unable Difficulty sitting down on and standing up from a chair with arms (e.g., wheelchair, bedside commode, etc,.)?: Unable Help needed moving to and from a bed to chair (including a wheelchair)?: A Lot Help needed walking in hospital room?: Total Help needed climbing 3-5 steps with a railing? : Total 6 Click Score: 7    End of Session Equipment Utilized During Treatment: Oxygen Activity Tolerance: Patient limited by fatigue Patient left: with call bell/phone within reach;in bed;with bed alarm set;Other (comment) (MD in room with patient) Nurse Communication: Mobility status PT Visit Diagnosis: Difficulty in walking, not elsewhere classified (R26.2);Muscle weakness (generalized) (M62.81)     Time: 0086-7619 PT Time Calculation (min) (ACUTE ONLY): 16 min  Charges:  $Therapeutic Exercise: 8-22 mins                    G Codes:       Lyndel Safe Huprich PT, DPT     Huprich,Jason 09/04/2017, 1:45 PM

## 2017-09-04 NOTE — Progress Notes (Signed)
Patient noted on assessment to have expiratory wheezing. Patient declined breathing treatment. Patient not in significant respiratory distress. Normal rate of respirations noted. Will continue to monitor and assess patient.

## 2017-09-04 NOTE — Progress Notes (Signed)
Patient left ICU alert via stretcher with [facility].  Discharge paperwork sent with EMS staff including Ambien prescription.  Patients son arrived just minutes after patient left ICU and RN instructed son that patient had just left going to [facility].

## 2017-09-04 NOTE — Clinical Social Work Note (Signed)
CSW informed by Dr. Posey Pronto that patient is to discharge today. CSW spoke with patient's son and extended bed offers. Patient's son chose [facility]. Patient to transport via EMS today. Lindsey Hobbs at [facility] is aware. Discharge information sent. Shela Leff MSW,LCSW [phone]

## 2017-09-04 NOTE — Progress Notes (Signed)
Pharmacy Electroltye Monitoring   83 y/o F with a h/o CHF is admitted with acute renal failure.  Sodium (mmol/L)  Date Value  09/04/2017 139  03/02/2013 143   Potassium (mmol/L)  Date Value  09/04/2017 5.4 (H)  03/02/2013 3.7   Magnesium (mg/dL)  Date Value  09/04/2017 1.9  03/02/2013 1.8   Phosphorus (mg/dL)  Date Value  09/04/2017 3.2   Calcium (mg/dL)  Date Value  09/04/2017 8.4 (L)   Calcium, Total (mg/dL)  Date Value  03/02/2013 8.6   Albumin (g/dL)  Date Value  09/02/2017 2.0 (L)  02/27/2013 2.6 (L)   Mg and phos replaced by CCM. Will recheck at 1800.   10/30 1758 Phos 2.1, Mg 2. Will give K Phos Neutral 500 mg po Q4H x 4 doses per protocol, and recheck electrolytes tomorrow with AM labs.  10/31 0830 Phos 3.2, K 5.4, Mg 1.9. Discontinued K Phos Neutral 500mg  prior to last dose due to increase in potassium. . Patient received 3 of 4 doses. Will recheck electrolytes with AM labs.   Lendon Ka, PharmD Pharmacy Resident 09/04/2017 [phone]

## 2017-09-04 NOTE — Progress Notes (Signed)
Pt is floor care status since 10/30 and PCCM has signed off. Please call if we can be of further assistance  Merton Border, MD PCCM service Mobile [phone] Pager [phone] 09/04/2017 12:11 PM

## 2017-09-04 NOTE — Progress Notes (Signed)
OT Cancellation Note  Patient Details Name: Lindsey Hobbs MRN: [id number] DOB: [birth year]   Cancelled Treatment:    Reason Eval/Treat Not Completed: Medical issues which prohibited therapy. Pt continues to have critical value for potassium (5.4). Contraindicated for OT evaluation at this time. Will re-attempt next date pending medically appropriate.  Jeni Salles, MPH, MS, OTR/L ascom [phone] 09/04/17, 8:11 AM

## 2017-09-05 DIAGNOSIS — N179 Acute kidney failure, unspecified: Secondary | ICD-10-CM | POA: Diagnosis not present

## 2017-09-05 DIAGNOSIS — I5022 Chronic systolic (congestive) heart failure: Secondary | ICD-10-CM | POA: Diagnosis not present

## 2017-09-05 DIAGNOSIS — J441 Chronic obstructive pulmonary disease with (acute) exacerbation: Secondary | ICD-10-CM | POA: Diagnosis not present

## 2017-09-10 DIAGNOSIS — K219 Gastro-esophageal reflux disease without esophagitis: Secondary | ICD-10-CM | POA: Diagnosis not present

## 2017-09-10 DIAGNOSIS — S81802A Unspecified open wound, left lower leg, initial encounter: Secondary | ICD-10-CM | POA: Diagnosis not present

## 2017-09-10 DIAGNOSIS — G4700 Insomnia, unspecified: Secondary | ICD-10-CM | POA: Diagnosis not present

## 2017-09-10 DIAGNOSIS — E875 Hyperkalemia: Secondary | ICD-10-CM | POA: Diagnosis not present

## 2017-09-10 DIAGNOSIS — N19 Unspecified kidney failure: Secondary | ICD-10-CM | POA: Diagnosis not present

## 2017-09-11 ENCOUNTER — Emergency Department: Payer: Medicare Other

## 2017-09-11 ENCOUNTER — Observation Stay
Admission: EM | Admit: 2017-09-11 | Discharge: 2017-09-13 | Disposition: A | Discharge status: Skilled Nursing Facility | Payer: Medicare Other | Attending: Internal Medicine | Admitting: Internal Medicine

## 2017-09-11 ENCOUNTER — Encounter: Payer: Self-pay | Admitting: Emergency Medicine

## 2017-09-11 ENCOUNTER — Other Ambulatory Visit: Payer: Self-pay

## 2017-09-11 DIAGNOSIS — R0602 Shortness of breath: Secondary | ICD-10-CM

## 2017-09-11 DIAGNOSIS — Z66 Do not resuscitate: Secondary | ICD-10-CM | POA: Insufficient documentation

## 2017-09-11 DIAGNOSIS — J90 Pleural effusion, not elsewhere classified: Secondary | ICD-10-CM | POA: Insufficient documentation

## 2017-09-11 DIAGNOSIS — R7989 Other specified abnormal findings of blood chemistry: Secondary | ICD-10-CM

## 2017-09-11 DIAGNOSIS — Z96642 Presence of left artificial hip joint: Secondary | ICD-10-CM | POA: Insufficient documentation

## 2017-09-11 DIAGNOSIS — R778 Other specified abnormalities of plasma proteins: Secondary | ICD-10-CM

## 2017-09-11 DIAGNOSIS — E785 Hyperlipidemia, unspecified: Secondary | ICD-10-CM | POA: Insufficient documentation

## 2017-09-11 DIAGNOSIS — K5090 Crohn's disease, unspecified, without complications: Secondary | ICD-10-CM | POA: Diagnosis not present

## 2017-09-11 DIAGNOSIS — D649 Anemia, unspecified: Secondary | ICD-10-CM | POA: Insufficient documentation

## 2017-09-11 DIAGNOSIS — I248 Other forms of acute ischemic heart disease: Secondary | ICD-10-CM | POA: Insufficient documentation

## 2017-09-11 DIAGNOSIS — J441 Chronic obstructive pulmonary disease with (acute) exacerbation: Secondary | ICD-10-CM | POA: Diagnosis not present

## 2017-09-11 DIAGNOSIS — F17210 Nicotine dependence, cigarettes, uncomplicated: Secondary | ICD-10-CM | POA: Insufficient documentation

## 2017-09-11 DIAGNOSIS — R0902 Hypoxemia: Secondary | ICD-10-CM | POA: Insufficient documentation

## 2017-09-11 DIAGNOSIS — Z86718 Personal history of other venous thrombosis and embolism: Secondary | ICD-10-CM | POA: Insufficient documentation

## 2017-09-11 DIAGNOSIS — I10 Essential (primary) hypertension: Secondary | ICD-10-CM | POA: Insufficient documentation

## 2017-09-11 DIAGNOSIS — J189 Pneumonia, unspecified organism: Secondary | ICD-10-CM

## 2017-09-11 DIAGNOSIS — Z79899 Other long term (current) drug therapy: Secondary | ICD-10-CM | POA: Insufficient documentation

## 2017-09-11 LAB — CBC WITH DIFFERENTIAL/PLATELET
Basophils Absolute: 0 10*3/uL (ref 0–0.1)
Basophils Relative: 1 %
EOS ABS: 0 10*3/uL (ref 0–0.7)
Eosinophils Relative: 1 %
HEMATOCRIT: 25 % — AB (ref 35.0–47.0)
HEMOGLOBIN: 8.2 g/dL — AB (ref 12.0–16.0)
LYMPHS ABS: 0.6 10*3/uL — AB (ref 1.0–3.6)
LYMPHS PCT: 12 %
MCH: 32.2 pg (ref 26.0–34.0)
MCHC: 32.7 g/dL (ref 32.0–36.0)
MCV: 98.6 fL (ref 80.0–100.0)
MONOS PCT: 6 %
Monocytes Absolute: 0.3 10*3/uL (ref 0.2–0.9)
NEUTROS ABS: 4.6 10*3/uL (ref 1.4–6.5)
NEUTROS PCT: 82 %
Platelets: 393 10*3/uL (ref 150–440)
RBC: 2.54 MIL/uL — ABNORMAL LOW (ref 3.80–5.20)
RDW: 16.6 % — ABNORMAL HIGH (ref 11.5–14.5)
WBC: 5.6 10*3/uL (ref 3.6–11.0)

## 2017-09-11 LAB — TROPONIN I
TROPONIN I: 0.62 ng/mL — AB (ref <0.03)
Troponin I: 0.8 ng/mL (ref <0.03)
Troponin I: 0.71 ng/mL (ref <0.03)
Troponin I: 0.67 ng/mL (ref <0.03)

## 2017-09-11 LAB — BASIC METABOLIC PANEL
Anion gap: 6 (ref 5–15)
BUN: 21 mg/dL — AB (ref 6–20)
CHLORIDE: 110 mmol/L (ref 101–111)
CO2: 29 mmol/L (ref 22–32)
CREATININE: 0.8 mg/dL (ref 0.44–1.00)
Calcium: 9.1 mg/dL (ref 8.9–10.3)
GFR calc Af Amer: >60 mL/min (ref >60)
GFR calc non Af Amer: >60 mL/min (ref >60)
GLUCOSE: 116 mg/dL — AB (ref 65–99)
Potassium: 4.5 mmol/L (ref 3.5–5.1)
Sodium: 145 mmol/L (ref 135–145)

## 2017-09-11 LAB — PROCALCITONIN

## 2017-09-11 LAB — LACTIC ACID, PLASMA: Lactic Acid, Venous: 1.3 mmol/L (ref 0.5–1.9)

## 2017-09-11 MED ORDER — ENSURE ENLIVE PO LIQD
237.0000 mL | Freq: Two times a day (BID) | ORAL | Status: DC
  Administered 2017-09-11 – 2017-09-12 (×2): 237 mL via ORAL

## 2017-09-11 MED ORDER — PANTOPRAZOLE SODIUM 40 MG PO TBEC
40.0000 mg | DELAYED_RELEASE_TABLET | Freq: Every day | ORAL | Status: DC
  Administered 2017-09-11 – 2017-09-13 (×3): 40 mg via ORAL
  Filled 2017-09-11 (×3): qty 1

## 2017-09-11 MED ORDER — ACETAMINOPHEN 650 MG RE SUPP: 650.0000 mg | Freq: Four times a day (QID) | RECTAL | Status: DC | PRN

## 2017-09-11 MED ORDER — VANCOMYCIN HCL IN DEXTROSE 1-5 GM/200ML-% IV SOLN
1000.0000 mg | Freq: Once | INTRAVENOUS | Status: AC
  Administered 2017-09-11: 1000 mg via INTRAVENOUS
  Filled 2017-09-11: qty 200

## 2017-09-11 MED ORDER — ATORVASTATIN CALCIUM 10 MG PO TABS
10.0000 mg | ORAL_TABLET | Freq: Every day | ORAL | Status: DC
  Administered 2017-09-11 – 2017-09-13 (×3): 10 mg via ORAL
  Filled 2017-09-11 (×3): qty 1

## 2017-09-11 MED ORDER — ADULT MULTIVITAMIN W/MINERALS CH
ORAL_TABLET | ORAL | Status: AC
  Administered 2017-09-11: 1 via ORAL
  Filled 2017-09-11: qty 1

## 2017-09-11 MED ORDER — TRAZODONE HCL 50 MG PO TABS: 25.0000 mg | ORAL_TABLET | Freq: Every evening | ORAL | Status: DC | PRN

## 2017-09-11 MED ORDER — FOLIC ACID 1 MG PO TABS
1.0000 mg | ORAL_TABLET | Freq: Every day | ORAL | Status: DC
  Administered 2017-09-11 – 2017-09-13 (×3): 1 mg via ORAL
  Filled 2017-09-11 (×3): qty 1

## 2017-09-11 MED ORDER — PIPERACILLIN-TAZOBACTAM 3.375 G IVPB 30 MIN
3.3750 g | Freq: Once | INTRAVENOUS | Status: AC
  Administered 2017-09-11: 3.375 g via INTRAVENOUS
  Filled 2017-09-11: qty 50

## 2017-09-11 MED ORDER — MESALAMINE 1.2 G PO TBEC
2.4000 g | DELAYED_RELEASE_TABLET | Freq: Every day | ORAL | Status: DC
  Administered 2017-09-12 – 2017-09-13 (×2): 2.4 g via ORAL
  Filled 2017-09-11 (×2): qty 2

## 2017-09-11 MED ORDER — ONDANSETRON HCL 4 MG/2ML IJ SOLN: 4.0000 mg | Freq: Four times a day (QID) | INTRAMUSCULAR | Status: DC | PRN

## 2017-09-11 MED ORDER — ACETAMINOPHEN 325 MG PO TABS: 650.0000 mg | ORAL_TABLET | Freq: Four times a day (QID) | ORAL | Status: DC | PRN

## 2017-09-11 MED ORDER — ASPIRIN 81 MG PO CHEW
324.0000 mg | CHEWABLE_TABLET | Freq: Once | ORAL | Status: AC
  Administered 2017-09-11: 324 mg via ORAL
  Filled 2017-09-11: qty 4

## 2017-09-11 MED ORDER — LORATADINE 10 MG PO TABS: 10.0000 mg | ORAL_TABLET | Freq: Every day | ORAL | Status: DC | PRN

## 2017-09-11 MED ORDER — ADULT MULTIVITAMIN W/MINERALS CH
1.0000 | ORAL_TABLET | Freq: Every day | ORAL | Status: DC
Start: 2017-09-11 — End: 2017-09-13
  Administered 2017-09-11 – 2017-09-13 (×3): 1 via ORAL
  Filled 2017-09-11 (×2): qty 1

## 2017-09-11 MED ORDER — METHYLPREDNISOLONE SODIUM SUCC 125 MG IJ SOLR
60.0000 mg | INTRAMUSCULAR | Status: DC
  Administered 2017-09-11 – 2017-09-12 (×2): 60 mg via INTRAVENOUS
  Filled 2017-09-11 (×2): qty 2

## 2017-09-11 MED ORDER — METHYLPREDNISOLONE SODIUM SUCC 125 MG IJ SOLR
125.0000 mg | Freq: Once | INTRAMUSCULAR | Status: AC
  Administered 2017-09-11: 125 mg via INTRAVENOUS
  Filled 2017-09-11: qty 2

## 2017-09-11 MED ORDER — GABAPENTIN 100 MG PO CAPS
200.0000 mg | ORAL_CAPSULE | Freq: Two times a day (BID) | ORAL | Status: DC
  Administered 2017-09-11 – 2017-09-13 (×5): 200 mg via ORAL
  Filled 2017-09-11 (×6): qty 2

## 2017-09-11 MED ORDER — LORATADINE 10 MG PO TABS
10.0000 mg | ORAL_TABLET | Freq: Every day | ORAL | Status: DC
  Administered 2017-09-11 – 2017-09-13 (×3): 10 mg via ORAL
  Filled 2017-09-11 (×3): qty 1

## 2017-09-11 MED ORDER — HEPARIN SODIUM (PORCINE) 5000 UNIT/ML IJ SOLN
5000.0000 [IU] | Freq: Three times a day (TID) | INTRAMUSCULAR | Status: DC
  Administered 2017-09-11 – 2017-09-13 (×7): 5000 [IU] via SUBCUTANEOUS
  Filled 2017-09-11 (×7): qty 1

## 2017-09-11 MED ORDER — IPRATROPIUM-ALBUTEROL 0.5-2.5 (3) MG/3ML IN SOLN
3.0000 mL | Freq: Once | RESPIRATORY_TRACT | Status: AC
  Administered 2017-09-11: 3 mL via RESPIRATORY_TRACT
  Filled 2017-09-11: qty 3

## 2017-09-11 MED ORDER — BISACODYL 5 MG PO TBEC
5.0000 mg | DELAYED_RELEASE_TABLET | Freq: Every day | ORAL | Status: DC | PRN
  Filled 2017-09-11: qty 1

## 2017-09-11 MED ORDER — ZOLPIDEM TARTRATE 5 MG PO TABS
5.0000 mg | ORAL_TABLET | Freq: Every evening | ORAL | Status: DC | PRN
  Administered 2017-09-11 – 2017-09-12 (×2): 5 mg via ORAL
  Filled 2017-09-11 (×2): qty 1

## 2017-09-11 MED ORDER — CALCIUM CARBONATE-VITAMIN D 500-200 MG-UNIT PO TABS
1.0000 | ORAL_TABLET | Freq: Two times a day (BID) | ORAL | Status: DC
  Administered 2017-09-11 – 2017-09-13 (×5): 1 via ORAL
  Filled 2017-09-11 (×6): qty 1

## 2017-09-11 MED ORDER — ONDANSETRON HCL 4 MG PO TABS: 4.0000 mg | ORAL_TABLET | Freq: Four times a day (QID) | ORAL | Status: DC | PRN

## 2017-09-11 MED ORDER — SODIUM CHLORIDE 0.9 % IV SOLN
INTRAVENOUS | Status: DC
  Administered 2017-09-11 – 2017-09-13 (×3): via INTRAVENOUS

## 2017-09-11 MED ORDER — DOCUSATE SODIUM 100 MG PO CAPS
100.0000 mg | ORAL_CAPSULE | Freq: Two times a day (BID) | ORAL | Status: DC
  Administered 2017-09-11 – 2017-09-13 (×5): 100 mg via ORAL
  Filled 2017-09-11 (×5): qty 1

## 2017-09-11 NOTE — ED Notes (Signed)
Upon this RN exiting room, patient called this RN back into room, pt states that she is hungry, requesting to eat her lunch tray. Pt's assisted to fix her lunch tray at this time. Denies any further needs. Will continue to monitor for further patient needs.

## 2017-09-11 NOTE — ED Notes (Signed)
This RN to bedside with patient's lunch tray, pt states that she is not that hungry right now, lunch tray left at bedside in case patient needs it at a later time.

## 2017-09-11 NOTE — Care Management (Signed)
RNCM spoke with patient's son Butch. He has an appointment today with SW at [facility]. Patient is only at [facility] for rehab- not long-term care. Patient was living independently up until ?6 weeks ago and only needed a cane. She is basically sit to stand with short distances now per Butch.  He states facility has been talking to him about palliative services which I explained to him. RNCM to follow.

## 2017-09-11 NOTE — ED Notes (Signed)
Report called to [place], Therapist, sports. Pt continues to rest in bed watching TV. VSS and WNL. Pt remains on O2 at this time. Pt to be taken upstairs by EDT.

## 2017-09-11 NOTE — ED Notes (Signed)
Dr Shah at bedside at this time.

## 2017-09-11 NOTE — ED Notes (Signed)
PT'S FAMILY GIVEN CODE TO CALL AND CHECK ON PATIENT, CODE LAST 4 OF PATIENT'S MRN [id number]. PT'S FAMILY GIVEN UPDATE ON PATIENT'S CONDITION AT THIS TIME.

## 2017-09-11 NOTE — H&P (Signed)
[facility] [place] NAME: Lindsey Hobbs    MR#:  [id number]  DATE OF BIRTH:  [birth year]  DATE OF ADMISSION:  09/11/2017  PRIMARY CARE PHYSICIAN: Cletis Athens, MD   REQUESTING/REFERRING PHYSICIAN: Paulette Blanch, MD  CHIEF COMPLAINT:   Chief Complaint  Patient presents with  . Shortness of Breath    HISTORY OF PRESENT ILLNESS:  Lindsey Hobbs  is a 83 y.o. female with a known history of COPD, CHF, Guillain Barre syndrome sent to the ED via EMS from skilled nursing facility for a chief complaint of shortness of breath. She reports progressive shortness of breath and productive cough since yesterday. Denies fever, chills, chest pain, abdominal pain, nausea, vomiting. Denies recent travel or trauma.  PAST MEDICAL HISTORY:   Past Medical History:  Diagnosis Date  . Anemia   . Avascular necrosis of bone of hip ([place])   . CHF (congestive heart failure) ([place])   . COPD (chronic obstructive pulmonary disease) ([place])   . Crohn's disease ([place])   . DVT (deep venous thrombosis) ([place]) 06/2017  . Guillain Barr syndrome ([place])   . Hyperlipidemia   . Hypertension     PAST SURGICAL HISTORY:   Past Surgical History:  Procedure Laterality Date  . ABDOMINAL HYSTERECTOMY    . JOINT REPLACEMENT    . TOTAL HIP ARTHROPLASTY Left ~11 yrs ago    SOCIAL HISTORY:   Social History   Tobacco Use  . Smoking status: Current Every Day Smoker    Packs/day: 1.50    Years: 67.00    Pack years: 100.50  . Smokeless tobacco: Never Used  Substance Use Topics  . Alcohol use: No    FAMILY HISTORY:   Family History  Problem Relation Age of Onset  . Breast cancer Mother     DRUG ALLERGIES:   Allergies  Allergen Reactions  . Ace Inhibitors     Severe hyperkalemia and AKI    REVIEW OF SYSTEMS:   Review of Systems  Constitutional: Negative for chills, fever and weight loss.  HENT: Negative for nosebleeds and sore throat.   Eyes: Negative for  blurred vision.  Respiratory: Positive for cough and shortness of breath. Negative for wheezing.   Cardiovascular: Negative for chest pain, orthopnea, leg swelling and PND.  Gastrointestinal: Negative for abdominal pain, constipation, diarrhea, heartburn, nausea and vomiting.  Genitourinary: Negative for dysuria and urgency.  Musculoskeletal: Negative for back pain.  Skin: Negative for rash.  Neurological: Negative for dizziness, speech change, focal weakness and headaches.  Endo/Heme/Allergies: Does not bruise/bleed easily.  Psychiatric/Behavioral: Negative for depression.   MEDICATIONS AT HOME:   Prior to Admission medications   Medication Sig Start Date End Date Taking? Authorizing Provider  atorvastatin (LIPITOR) 10 MG tablet Take 1 tablet (10 mg total) by mouth daily. 08/08/17 08/08/18 Yes Dew, Erskine Squibb, MD  Calcium Carbonate-Vitamin D (CALCIUM 600+D) 600-400 MG-UNIT per tablet Take 1 tablet by mouth 2 (two) times daily.   Yes [provider]  ferrous sulfate 325 (65 FE) MG tablet Take 1 tablet (325 mg total) by mouth 2 (two) times daily with a meal. Patient taking differently: Take 325 mg by mouth daily.  07/08/15  Yes Sudini, Alveta Heimlich, MD  fexofenadine (ALLEGRA) 180 MG tablet Take 180 mg by mouth at bedtime.   Yes [provider]  folic acid (FOLVITE) 1 MG tablet Take 1 mg by mouth daily.   Yes [provider]  gabapentin (NEURONTIN) 100 MG  capsule Take 200 mg by mouth. One in the morning and 2 at night   Yes [provider]  ipratropium-albuterol (DUONEB) 0.5-2.5 (3) MG/3ML SOLN Take 3 mLs by nebulization every 6 (six) hours as needed. 06/06/17  Yes Gladstone Lighter, MD  loratadine (CLARITIN) 10 MG tablet Take 10 mg by mouth daily as needed for allergies.   Yes [provider]  mesalamine (LIALDA) 1.2 g EC tablet Take 2.4 g by mouth daily with breakfast.    Yes [provider]  Multiple Vitamin (MULTIVITAMIN) tablet Take 1 tablet by  mouth daily.   Yes [provider]  Omega-3 Fatty Acids (FISH OIL PO) Take 1 capsule by mouth daily.    Yes [provider]  pantoprazole (PROTONIX) 40 MG tablet Take 40 mg by mouth daily. 05/13/17  Yes [provider]  zolpidem (AMBIEN) 10 MG tablet Take 10 mg by mouth at bedtime as needed for sleep.   Yes [provider]  feeding supplement, ENSURE ENLIVE, (ENSURE ENLIVE) LIQD Take 237 mLs by mouth 2 (two) times daily between meals. 09/04/17   Fritzi Mandes, MD      VITAL SIGNS:  Blood pressure 115/60, pulse 95, temperature (!) 96.6 F (35.9 C), temperature source Rectal, resp. rate (!) 23, height 5\' 4"  (1.626 m), weight 58 kg (127 lb 13.9 oz), SpO2 98 %. PHYSICAL EXAMINATION:  Physical Exam  GENERAL:  83 y.o.-year-old patient lying in the bed with no acute distress.  EYES: Pupils equal, round, reactive to light and accommodation. No scleral icterus. Extraocular muscles intact.  HEENT: Head atraumatic, normocephalic. Oropharynx and nasopharynx clear.  NECK:  Supple, no jugular venous distention. No thyroid enlargement, no tenderness.  LUNGS: Decreased breath sounds bilaterally, mild wheezing, no rales,rhonchi or crepitation. No use of accessory muscles of respiration.  CARDIOVASCULAR: S1, S2 normal. No murmurs, rubs, or gallops.  ABDOMEN: Soft, nontender, nondistended. Bowel sounds present. No organomegaly or mass.  EXTREMITIES: No pedal edema, cyanosis, or clubbing.  NEUROLOGIC: Cranial nerves II through XII are intact. Muscle strength 5/5 in all extremities. Sensation intact. Gait not checked.  PSYCHIATRIC: The patient is alert and oriented x 3.  SKIN: No obvious rash, lesion, or ulcer.  LABORATORY PANEL:   CBC Recent Labs  Lab 09/11/17 0328  WBC 5.6  HGB 8.2*  HCT 25.0*  PLT 393   ------------------------------------------------------------------------------------------------------------------  Chemistries  Recent Labs  Lab 09/11/17 0328    NA 145  K 4.5  CL 110  CO2 29  GLUCOSE 116*  BUN 21*  CREATININE 0.80  CALCIUM 9.1   ------------------------------------------------------------------------------------------------------------------  Cardiac Enzymes Recent Labs  Lab 09/11/17 0328  TROPONINI 0.80*   ------------------------------------------------------------------------------------------------------------------  RADIOLOGY:  Dg Chest Port 1 View  Result Date: 09/11/2017 CLINICAL DATA:  Shortness of breath EXAM: PORTABLE CHEST 1 VIEW COMPARISON:  06/18/2017 FINDINGS: Small bilateral effusions. Bibasilar left greater than right consolidations. Mild cardiomegaly with minimal central congestion. Aortic atherosclerosis. No pneumothorax. IMPRESSION: Small pleural effusions with bibasilar left greater than right atelectasis or infiltrates. Electronically Signed   By: Donavan Foil M.D.   On: 09/11/2017 03:41   IMPRESSION AND PLAN:  83 year old Female with above-mentioned medical problem being admitted for COPD exacerbation  *COPD exacerbation -We will initiate COPD Gold protocol -Start her on IV steroids and nebulizer breathing treatment -Check pro-calcitonin to decide need for antibiotics. doubt this is bacterial pneumonia -for now hold off on antibiotics  *Elevated troponin -Can be due to demand ischemia  - serial troponins -Consider cardiology consultation if  troponin trends up  * Crohn's disease Without exacerbation Stable Continue mesalamine  *Pleural effusion -Seems very small, doubt thoracentesis needed -We will try small dose of Lasix   Palliative care consultation considering recurrent admission -patient is a resident at [facility]. likely can go back with palliative care to follow.  Alternatively she could go home with hospice if qualifies and family in agreement   All the records are reviewed and case discussed with ED provider. Management plans discussed with the patient, nursing and  they are in agreement.  CODE STATUS: DNR  TOTAL TIME TAKING CARE OF THIS PATIENT: 45 minutes.    Max Sane M.D on 09/11/2017 at 12:40 PM  Between 7am to 6pm - Pager - [phone]  After 6pm go to [email or web address] - Proofreader  [facility]  Office  [phone]  CC: Primary care physician; Cletis Athens, MD   Note: This dictation was prepared with Dragon dictation along with smaller phrase technology. Any transcriptional errors that result from this process are unintentional.

## 2017-09-11 NOTE — Care Management Obs Status (Signed)
[facility] NOTIFICATION   Patient Details  Name: Lindsey Hobbs MRN: [id number] Date of Birth: September 07, 1927   Medicare Observation Status Notification Given:  Yes  Patient requests son "Butch" aka Own Mejorado be updated; spoke with /explained [facility] via telephone to son Daiva Nakayama.   Marshell Garfinkel, RN 09/11/2017, 9:59 AM

## 2017-09-11 NOTE — ED Provider Notes (Addendum)
[facility] Emergency Department Provider Note    ____________________________________________   First MD Initiated Contact with Patient 09/11/17 [phone]     (approximate)  I have reviewed the triage vital signs and the nursing notes.   HISTORY  Chief Complaint Shortness of Breath    HPI Lindsey Hobbs is a 83 y.o. female sent to the ED via EMS from skilled nursing facility for a chief complaint of shortness of breath.patient has a history of COPD, CHF, Guillain Barre syndrome who presents with progressive shortness of breath 1 day.Notes productive cough. Denies fever, chills, chest pain, abdominal pain, nausea, vomiting. Denies recent travel or trauma. Nothing makes her symptoms better or worse.   Past Medical History:  Diagnosis Date  . Anemia   . Avascular necrosis of bone of hip ([place])   . CHF (congestive heart failure) ([place])   . COPD (chronic obstructive pulmonary disease) ([place])   . Crohn's disease ([place])   . DVT (deep venous thrombosis) ([place]) 06/2017  . Guillain Barr syndrome ([place])   . Hyperlipidemia   . Hypertension     Patient Active Problem List   Diagnosis Date Noted  . Back pain   . Hyperkalemia   . Fall   . Acute renal failure (ARF) ([place]) 08/31/2017  . Hyperlipidemia 08/02/2017  . Atherosclerosis of native arteries of the extremities with ulceration ([place]) 08/02/2017  . Chronic systolic heart failure ([place]) 07/04/2017  . HTN (hypertension) 07/04/2017  . Tobacco use 07/04/2017  . COPD exacerbation ([place])   . Palliative care by specialist   . Goals of care, counseling/discussion   . Chronic deep vein thrombosis (DVT) of right lower extremity ([place]) 06/18/2017  . Dyspnea 06/18/2017  . Demand ischemia ([place]) 06/18/2017  . Tobacco abuse counseling 06/18/2017  . Right leg DVT ([place]) 06/18/2017  . Ulcerative colitis ([place])   . Anemia 06/04/2017  . Rectal bleed 06/02/2017  . GI bleed 07/05/2015    Past Surgical History:  Procedure  Laterality Date  . ABDOMINAL HYSTERECTOMY    . JOINT REPLACEMENT    . TOTAL HIP ARTHROPLASTY Left ~11 yrs ago    Prior to Admission medications   Medication Sig Start Date End Date Taking? Authorizing Provider  atorvastatin (LIPITOR) 10 MG tablet Take 1 tablet (10 mg total) by mouth daily. 08/08/17 08/08/18 Yes Dew, Erskine Squibb, MD  Calcium Carbonate-Vitamin D (CALCIUM 600+D) 600-400 MG-UNIT per tablet Take 1 tablet by mouth 2 (two) times daily.   Yes [provider]  ferrous sulfate 325 (65 FE) MG tablet Take 1 tablet (325 mg total) by mouth 2 (two) times daily with a meal. Patient taking differently: Take 325 mg by mouth daily.  07/08/15  Yes Sudini, Alveta Heimlich, MD  fexofenadine (ALLEGRA) 180 MG tablet Take 180 mg by mouth at bedtime.   Yes [provider]  folic acid (FOLVITE) 1 MG tablet Take 1 mg by mouth daily.   Yes [provider]  gabapentin (NEURONTIN) 100 MG capsule Take 200 mg by mouth. One in the morning and 2 at night   Yes [provider]  ipratropium-albuterol (DUONEB) 0.5-2.5 (3) MG/3ML SOLN Take 3 mLs by nebulization every 6 (six) hours as needed. 06/06/17  Yes Gladstone Lighter, MD  loratadine (CLARITIN) 10 MG tablet Take 10 mg by mouth daily as needed for allergies.   Yes [provider]  mesalamine (LIALDA) 1.2 g EC tablet Take 2.4 g by mouth daily with breakfast.    Yes [provider]  Multiple Vitamin (MULTIVITAMIN) tablet Take 1 tablet by mouth daily.   Yes [provider]  Omega-3 Fatty Acids (FISH OIL PO) Take 1 capsule by mouth daily.    Yes [provider]  pantoprazole (PROTONIX) 40 MG tablet Take 40 mg by mouth daily. 05/13/17  Yes [provider]  zolpidem (AMBIEN) 10 MG tablet Take 10 mg by mouth at bedtime as needed for sleep.   Yes [provider]  feeding supplement, ENSURE ENLIVE, (ENSURE ENLIVE) LIQD Take 237 mLs by mouth 2 (two) times daily between meals. 09/04/17   Fritzi Mandes, MD      Allergies Ace inhibitors  Family History  Problem Relation Age of Onset  . Breast cancer Mother     Social History Social History   Tobacco Use  . Smoking status: Current Every Day Smoker    Packs/day: 1.50    Years: 67.00    Pack years: 100.50  . Smokeless tobacco: Never Used  Substance Use Topics  . Alcohol use: No  . Drug use: No    Review of Systems   Constitutional: No fever/chills. Eyes: No visual changes. ENT: No sore throat. Cardiovascular: Denies chest pain. Respiratory: positive for cough and shortness of breath. Gastrointestinal: No abdominal pain.  No nausea, no vomiting.  No diarrhea.  No constipation. Genitourinary: Negative for dysuria. Musculoskeletal: Negative for back pain. Skin: Negative for rash. Neurological: Negative for headaches, focal weakness or numbness.   ____________________________________________   PHYSICAL EXAM:  VITAL SIGNS: ED Triage Vitals  Enc Vitals Group     BP 09/11/17 0314 128/68     Pulse Rate 09/11/17 0314 98     Resp 09/11/17 0314 (!) 24     Temp 09/11/17 0314 (!) 96.6 F (35.9 C)     Temp Source 09/11/17 0314 Rectal     SpO2 09/11/17 0307 (!) 88 %     Weight 09/11/17 0318 127 lb 13.9 oz (58 kg)     Height 09/11/17 0318 5\' 4"  (1.626 m)     Head Circumference --      Peak Flow --      Pain Score --      Pain Loc --      Pain Edu? --      Excl. in [place]? --     Constitutional: Alert and oriented. Frail appearing and in mild acute distress. Eyes: Conjunctivae are normal. PERRL. EOMI. Head: Atraumatic. old right forehead hematoma. Nose: No congestion/rhinnorhea. Mouth/Throat: Mucous membranes are moist.  Oropharynx non-erythematous. Neck: No stridor.   Cardiovascular: Normal rate, regular rhythm. Grossly normal heart sounds.  Good peripheral circulation. Respiratory: Increased respiratory effort.  No retractions. Lungs diminished bilaterally. Gastrointestinal: Soft and nontender. No distention. No  abdominal bruits. No CVA tenderness. Musculoskeletal: Scattered old skin tears to bilateral lower extremities.No lower extremity tenderness nor edema.  No joint effusions. Neurologic:  Normal speech and language. No gross focal neurologic deficits are appreciated.  Skin:  Skin is warm, dry and intact. No rash noted. Psychiatric: Mood and affect are normal. Speech and behavior are normal.  ____________________________________________   LABS (all labs ordered are listed, but only abnormal results are displayed)  Labs Reviewed  CBC WITH DIFFERENTIAL/PLATELET - Abnormal; Notable for the following components:      Result Value   RBC 2.54 (*)    Hemoglobin 8.2 (*)    HCT 25.0 (*)    RDW 16.6 (*)    Lymphs Abs 0.6 (*)    All other components within  normal limits  BASIC METABOLIC PANEL - Abnormal; Notable for the following components:   Glucose, Bld 116 (*)    BUN 21 (*)    All other components within normal limits  TROPONIN I - Abnormal; Notable for the following components:   Troponin I 0.80 (*)    All other components within normal limits  CULTURE, BLOOD (ROUTINE X 2)  CULTURE, BLOOD (ROUTINE X 2)  LACTIC ACID, PLASMA  LACTIC ACID, PLASMA   ____________________________________________  EKG  ED ECG REPORT I, Briasia Flinders J, the attending physician, personally viewed and interpreted this ECG.   Date: 09/11/2017  EKG Time: 0310  Rate: 102  Rhythm: sinus tachycardia  Axis: normal  Intervals:none  ST&T Change: nonspecific  ____________________________________________  RADIOLOGY  Dg Chest Port 1 View  Result Date: 09/11/2017 CLINICAL DATA:  Shortness of breath EXAM: PORTABLE CHEST 1 VIEW COMPARISON:  06/18/2017 FINDINGS: Small bilateral effusions. Bibasilar left greater than right consolidations. Mild cardiomegaly with minimal central congestion. Aortic atherosclerosis. No pneumothorax. IMPRESSION: Small pleural effusions with bibasilar left greater than right atelectasis or  infiltrates. Electronically Signed   By: Donavan Foil M.D.   On: 09/11/2017 03:41    ____________________________________________   PROCEDURES  Procedure(s) performed: None  Procedures  Critical Care performed: Yes, see critical care note(s)   CRITICAL CARE Performed by: Paulette Blanch   Total critical care time: 45 minutes  Critical care time was exclusive of separately billable procedures and treating other patients.  Critical care was necessary to treat or prevent imminent or life-threatening deterioration.  Critical care was time spent personally by me on the following activities: development of treatment plan with patient and/or surrogate as well as nursing, discussions with consultants, evaluation of patient's response to treatment, examination of patient, obtaining history from patient or surrogate, ordering and performing treatments and interventions, ordering and review of laboratory studies, ordering and review of radiographic studies, pulse oximetry and re-evaluation of patient's condition.  ____________________________________________   INITIAL IMPRESSION / ASSESSMENT AND PLAN / ED COURSE  As part of my medical decision making, I reviewed the following data within the [facility] notes reviewed and incorporated, Labs reviewed, EKG interpreted, Radiograph reviewed, Discussed with admitting physician and Notes from prior ED visits.   83 year old female with COPD, CHF who presents with increased shortness of breath. Differential includes, but is not limited to, viral syndrome, bronchitis including COPD exacerbation, pneumonia, reactive airway disease including asthma, CHF including exacerbation with or without pulmonary/interstitial edema, pneumothorax, ACS, thoracic trauma, and pulmonary embolism.  Will obtain screening lab work including troponin, EKG, chest x-ray, administer DuoNeb, IV Solu-Medrol and reassess.  Clinical Course as of Sep 11 424  Wed Sep 11, 2017  4481 Noted elevated troponin, baseline anemia. Will administer aspirin. Feel elevated troponin most likely secondary to ischemic demand secondary to shortness of breath. Will discuss with hospitalist Dr. Marcille Blanco to evaluate patient in the emergency department for admission.  [JS]    Clinical Course User Index [JS] Paulette Blanch, MD     ____________________________________________   FINAL CLINICAL IMPRESSION(S) / ED DIAGNOSES  Final diagnoses:  Shortness of breath  COPD exacerbation ([place])  Hypoxia  HCAP (healthcare-associated pneumonia)  Pleural effusion  Elevated troponin     ED Discharge Orders    None       Note:  This document was prepared using Dragon voice recognition software and may include unintentional dictation errors.    Paulette Blanch, MD 09/11/17 [phone]  Paulette Blanch, MD 09/11/17 [phone]

## 2017-09-11 NOTE — ED Notes (Signed)
Pt repositioned in the bed, had 1 episode of stool incontinence, placed in clean brief at this time. Pt placed onto L side at this time to prevent skin breakdown. Pt resting in bed watching TV. Denies further needs at this time. Will continue to monitor for further patient needs.

## 2017-09-11 NOTE — ED Triage Notes (Signed)
Pt arrived to the ED via EMS from [facility] for complaint of SOB. Pt is a COPD'er and is in O2 at home per Pt. Pt is actively coughing with decreased lung sounds. Pt is AOx3 with notable respiratory distress.

## 2017-09-11 NOTE — ED Notes (Signed)
Pt assisted onto the bedpan at this time. VSS at this time. Pt tolerated well. Will continue to monitor for further patient needs.

## 2017-09-11 NOTE — Care Management Note (Signed)
Case Management Note  Patient Details  Name: Lindsey Hobbs MRN: [id number] Date of Birth: 07/22/1927  Subjective/Objective:                  RNCM consult received for COPD Gold. Patient is long-term resident from [facility]. CSW consult in place. Patient is oriented but asks that her son "Lindsey" Tona Hobbs be contacted about decisions too.   Patient states she is wheelchair dependent and on chronic O2 at the facility.  She states she hurts in her legs and her head after a fall she took at the facility.  She has notable bruising to her face and forehead.  Patient requests to use the toilet and to eat.     Action/Plan: ED RN updated of patient's request. Message left for son Lindsey to call this RNCM so we can discuss Observation letter from Medicare.    Expected Discharge Date:                  Expected Discharge Plan:     In-House Referral:  Clinical Social Work  Discharge planning Services  CM Consult  Post Acute Care Choice:    Choice offered to:  Patient, Adult Children  DME Arranged:    DME Agency:     HH Arranged:    [place] Agency:     Status of Service:  In process, will continue to follow  If discussed at Long Length of Stay Meetings, dates discussed:    Additional Comments:  Marshell Garfinkel, RN 09/11/2017, 9:26 AM

## 2017-09-11 NOTE — ED Notes (Signed)
Pt repositioned in bed at this time. Pt noted to be alert, watching TV. Pro calcitonin, troponin collected at this time, medication administered per MD order at this time. Will continue to monitor for further patient needs. VSS and WNL.

## 2017-09-11 NOTE — ED Notes (Signed)
Pt cleaned up of stool incontinence at this time.

## 2017-09-11 NOTE — ED Notes (Signed)
This RN to bedside, pt repositioned at this time. Pt's family at bedside, pt gave verbal permission for this RN to discuss plan of care with family. Pt denies any needs at this time, pt is alert and watching TV.

## 2017-09-11 NOTE — ED Notes (Signed)
This RN to bedside at this time to introduce self to patient. Pt repositioned in the bed, pt states "my back hurts, I've been laying here too long". This RN explained waiting for admitting MD. Pt resting in bed with eyes closed, easily wakes up when this RN begins speaking to her. Pt denies further needs at this time.

## 2017-09-12 DIAGNOSIS — J441 Chronic obstructive pulmonary disease with (acute) exacerbation: Secondary | ICD-10-CM

## 2017-09-12 DIAGNOSIS — Z7189 Other specified counseling: Secondary | ICD-10-CM | POA: Diagnosis not present

## 2017-09-12 LAB — CBC
HEMATOCRIT: 22.3 % — AB (ref 35.0–47.0)
Hemoglobin: 7.1 g/dL — ABNORMAL LOW (ref 12.0–16.0)
MCH: 31.5 pg (ref 26.0–34.0)
MCHC: 31.9 g/dL — ABNORMAL LOW (ref 32.0–36.0)
MCV: 99 fL (ref 80.0–100.0)
Platelets: 388 10*3/uL (ref 150–440)
RBC: 2.25 MIL/uL — AB (ref 3.80–5.20)
RDW: 16.7 % — AB (ref 11.5–14.5)
WBC: 8.3 10*3/uL (ref 3.6–11.0)

## 2017-09-12 LAB — BASIC METABOLIC PANEL
ANION GAP: 7 (ref 5–15)
BUN: 26 mg/dL — ABNORMAL HIGH (ref 6–20)
CO2: 29 mmol/L (ref 22–32)
Calcium: 8.6 mg/dL — ABNORMAL LOW (ref 8.9–10.3)
Chloride: 111 mmol/L (ref 101–111)
Creatinine, Ser: 0.93 mg/dL (ref 0.44–1.00)
GFR, EST NON AFRICAN AMERICAN: 53 mL/min — AB (ref >60)
Glucose, Bld: 111 mg/dL — ABNORMAL HIGH (ref 65–99)
POTASSIUM: 4.5 mmol/L (ref 3.5–5.1)
SODIUM: 147 mmol/L — AB (ref 135–145)

## 2017-09-12 LAB — PROCALCITONIN: Procalcitonin: <0.1 ng/mL

## 2017-09-12 LAB — GLUCOSE, CAPILLARY: GLUCOSE-CAPILLARY: 115 mg/dL — AB (ref 65–99)

## 2017-09-12 MED ORDER — PREMIER PROTEIN SHAKE
11.0000 [oz_av] | Freq: Two times a day (BID) | ORAL | Status: DC
  Administered 2017-09-12 (×2): 11 [oz_av] via ORAL

## 2017-09-12 NOTE — Clinical Social Work Note (Signed)
Clinical Social Work Assessment  Patient Details  Name: Lindsey Hobbs MRN: [id number] Date of Birth: [birth year]  Date of referral:  09/12/17               Reason for consult:  Discharge Planning                Permission sought to share information with:  Facility Sport and exercise psychologist, Family Supports Permission granted to share information::  Yes, Verbal Permission Granted  Name::        Agency::     Relationship::     Contact Information:     Housing/Transportation Living arrangements for the past 2 months:  [place], [place] of Information:  Patient Patient Interpreter Needed:  None Criminal Activity/Legal Involvement Pertinent to Current Situation/Hospitalization:  No - Comment as needed Significant Relationships:  Adult Children(son: Mikala Podoll: [phone]) Lives with:  Facility Resident Do you feel safe going back to the place where you live?  Yes Need for family participation in patient care:  Yes (Comment)  Care giving concerns:  Patient was placed at [facility] by this CSW for STR last week.   Social Worker assessment / plan:  Patient is known to [place] from admission to hospital last week. CSW has spoken to [place] at [facility] and patient can return when time. CSW spoke with patient this morning and she wishes to return to [facility] at discharge to finish out her rehab when time.  Employment status:  Retired Forensic scientist:  Information systems manager, Medicaid In [place] PT Recommendations:    Information / Referral to community resources:     Patient/Family's Response to care:  Patient expressed appreciation for CSW assistance.  Patient/Family's Understanding of and Emotional Response to Diagnosis, Current Treatment, and Prognosis:  Patient has a son that will need to be updated and notified when patient is ready for discharge.  Emotional Assessment Appearance:  Appears stated age Attitude/Demeanor/Rapport:  (pleasant and  cooperative) Affect (typically observed):  Accepting, Adaptable Orientation:  Oriented to Self, Oriented to Place, Oriented to Situation, Oriented to  Time Alcohol / Substance use:  Not Applicable Psych involvement (Current and /or in the community):  No (Comment)  Discharge Needs  Concerns to be addressed:  Care Coordination Readmission within the last 30 days:  Yes Current discharge risk:  None Barriers to Discharge:  No Barriers Identified   Shela Leff, LCSW 09/12/2017, 10:28 AM

## 2017-09-12 NOTE — Progress Notes (Signed)
[place] [facility] [place] NAME: Lindsey Hobbs    MR#:  [id number]  DATE OF BIRTH:  May 19, 1927  SUBJECTIVE:  CHIEF COMPLAINT:   Chief Complaint  Patient presents with  . Shortness of Breath  feels SOB, seems comfortable, on 2 liters O2 REVIEW OF SYSTEMS:  Review of Systems  Constitutional: Negative for chills, fever and weight loss.  HENT: Negative for nosebleeds and sore throat.   Eyes: Negative for blurred vision.  Respiratory: Positive for shortness of breath. Negative for cough and wheezing.   Cardiovascular: Negative for chest pain, orthopnea, leg swelling and PND.  Gastrointestinal: Negative for abdominal pain, constipation, diarrhea, heartburn, nausea and vomiting.  Genitourinary: Negative for dysuria and urgency.  Musculoskeletal: Negative for back pain.  Skin: Negative for rash.  Neurological: Negative for dizziness, speech change, focal weakness and headaches.  Endo/Heme/Allergies: Does not bruise/bleed easily.  Psychiatric/Behavioral: Negative for depression.    DRUG ALLERGIES:   Allergies  Allergen Reactions  . Ace Inhibitors     Severe hyperkalemia and AKI   VITALS:  Blood pressure (!) 110/49, pulse 92, temperature (!) 97.5 F (36.4 C), temperature source Oral, resp. rate 14, height 5\' 4"  (1.626 m), weight 58 kg (127 lb 13.9 oz), SpO2 91 %. PHYSICAL EXAMINATION:  Physical Exam  Constitutional: She is oriented to person, place, and time and well-developed, well-nourished, and in no distress.  HENT:  Head: Normocephalic and atraumatic.  Eyes: Conjunctivae and EOM are normal. Pupils are equal, round, and reactive to light.  Neck: Normal range of motion. Neck supple. No tracheal deviation present. No thyromegaly present.  Cardiovascular: Normal rate, regular rhythm and normal heart sounds.  Pulmonary/Chest: Effort normal. No respiratory distress. She has wheezes. She exhibits no tenderness.  Abdominal: Soft. Bowel sounds  are normal. She exhibits no distension. There is no tenderness.  Musculoskeletal: Normal range of motion.  Neurological: She is alert and oriented to person, place, and time. No cranial nerve deficit.  Skin: Skin is warm and dry. No rash noted.  Psychiatric: Mood and affect normal.   LABORATORY PANEL:  Female CBC Recent Labs  Lab 09/12/17 0520  WBC 8.3  HGB 7.1*  HCT 22.3*  PLT 388   ------------------------------------------------------------------------------------------------------------------ Chemistries  Recent Labs  Lab 09/12/17 0520  NA 147*  K 4.5  CL 111  CO2 29  GLUCOSE 111*  BUN 26*  CREATININE 0.93  CALCIUM 8.6*   RADIOLOGY:  No results found. ASSESSMENT AND PLAN:  83 year old Female with above-mentioned medical problem being admitted for COPD exacerbation  *COPD exacerbation - continue IV steroids and nebulizer breathing treatment - pro-calcitonin neg - no need of Abx.  *Elevated troponin - due to demand ischemia   * Crohn's disease Without exacerbation Stable Continue mesalamine  *Pleural effusion -Seems very small, doubt thoracentesis needed - Lasix prn     All the records are reviewed and case discussed with Care Management/Social Worker. Management plans discussed with the patient, family and they are in agreement.  CODE STATUS: DNR  TOTAL TIME TAKING CARE OF THIS PATIENT: 35 minutes.   More than 50% of the time was spent in counseling/coordination of care: YES  POSSIBLE D/C IN 1 DAYS, DEPENDING ON CLINICAL CONDITION.   Max Sane M.D on 09/12/2017 at 2:41 PM  Between 7am to 6pm - Pager - [phone]  After 6pm go to [email or web address] - [facility]  Office  [phone]  CC: Primary care physician; [place],  Viann Shove, MD  Note: This dictation was prepared with Dragon dictation along with smaller phrase technology. Any transcriptional errors that result from this process are  unintentional.

## 2017-09-12 NOTE — Evaluation (Signed)
Physical Therapy Evaluation Patient Details Name: Lindsey Hobbs MRN: [id number] DOB: [birth year] Today's Date: 09/12/2017   History of Present Illness  Pt is an 83 year old Caucasian female with a past medical history that includes anemia, avascular necrosis of the hip, CHF, COPD, Crohn's Dz, DVT, Guillain Barre syndrome, HLD, and HTN who presented to the ED from short term rehab with SOB. Recent admission due to mechanical fall at home down flight of stairs.  Troponin downtrending at this time.  Clinical Impression  Pt is a pleasant 83 year old female who was admitted for COPD. Pt performs bed mobility, transfers, and ambulation with min assist and RW. All mobility performed on 2L of O2 with sats WNL. Pt demonstrates deficits with strength/mobility/endurance. Pt is still not at baseline level, recently at [facility] Fall and plans to return at discharge. Would benefit from skilled PT to address above deficits and promote optimal return to PLOF; recommend transition to STR upon discharge from acute hospitalization.       Follow Up Recommendations SNF    Equipment Recommendations  None recommended by PT    Recommendations for Other Services       Precautions / Restrictions Precautions Precautions: Fall Restrictions Weight Bearing Restrictions: No      Mobility  Bed Mobility Overal bed mobility: Needs Assistance Bed Mobility: Supine to Sit     Supine to sit: Min assist     General bed mobility comments: needed assist for trunkal elevation and scooting towards EOB  Transfers Overall transfer level: Needs assistance Equipment used: Rolling walker (2 wheeled) Transfers: Sit to/from Stand Sit to Stand: Min assist         General transfer comment: safe technique performed with cues to push from seated surface. Upright posture noted  Ambulation/Gait Ambulation/Gait assistance: Min assist Ambulation Distance (Feet): 5 Feet Assistive device: Rolling walker (2 wheeled) Gait  Pattern/deviations: Step-to pattern;Trunk flexed     General Gait Details: small steps towards recliner with flexed posture. 2L of O2 donned for all mobility with sats at 93%.  Stairs            Wheelchair Mobility    Modified Rankin (Stroke Patients Only)       Balance Overall balance assessment: Needs assistance Sitting-balance support: No upper extremity supported Sitting balance-Leahy Scale: Good     Standing balance support: Bilateral upper extremity supported Standing balance-Leahy Scale: Fair                               Pertinent Vitals/Pain Pain Assessment: No/denies pain    Home Living Family/patient expects to be discharged to:: Private residence Living Arrangements: Alone Available Help at Discharge: Family;Available PRN/intermittently Type of Home: House Home Access: Stairs to enter Entrance Stairs-Rails: Right;Left;Can reach both Entrance Stairs-Number of Steps: 6 Home Layout: Two level;1/2 bath on main level;Bed/bath upstairs Home Equipment: Walker - 2 wheels;Cane - single point;Shower seat;Hand held shower head;Adaptive equipment Additional Comments: Pt has recently been at rehab secondary to falls, plans to return to rehab to continue therapy    Prior Function Level of Independence: Needs assistance   Gait / Transfers Assistance Needed: prior to STR from recent previous admission, pt reports Mod I with amb with SPC  ADL's / Homemaking Assistance Needed: prior to STR from recent previous admission, pt reports indep with bathing, dressing, light meal prep; had HHA assist with medication mgt, groceries, cleaning, and driving [place]  Comments: recently reports using RW for  minimal distances     Hand Dominance   Dominant Hand: Right    Extremity/Trunk Assessment   Upper Extremity Assessment Upper Extremity Assessment: Generalized weakness(B UE grossly 4/5)    Lower Extremity Assessment Lower Extremity Assessment: Generalized  weakness(B LE grossly 4/5)    Cervical / Trunk Assessment Cervical / Trunk Assessment: Normal  Communication   Communication: No difficulties  Cognition Arousal/Alertness: Awake/alert Behavior During Therapy: WFL for tasks assessed/performed Overall Cognitive Status: Within Functional Limits for tasks assessed                                        General Comments      Exercises Other Exercises Other Exercises: Pt educated in pursed lip breathing to support SOB, pt verbalized understanding. Other Exercises: Pt performed seated there-x including B LE LAQ, hip abd/add, and SLR. All ther-ex performed x 10 reps with cues for correct technique.   Assessment/Plan    PT Assessment Patient needs continued PT services  PT Problem List Decreased strength;Decreased activity tolerance;Decreased balance;Decreased knowledge of use of DME;Decreased mobility       PT Treatment Interventions DME instruction;Gait training;Stair training;Functional mobility training;Neuromuscular re-education;Balance training;Therapeutic exercise;Therapeutic activities;Patient/family education    PT Goals (Current goals can be found in the Care Plan section)  Acute Rehab PT Goals Patient Stated Goal: To get stronger PT Goal Formulation: With patient Time For Goal Achievement: 09/26/17 Potential to Achieve Goals: Good    Frequency Min 2X/week   Barriers to discharge        Co-evaluation               AM-PAC PT "6 Clicks" Daily Activity  Outcome Measure Difficulty turning over in bed (including adjusting bedclothes, sheets and blankets)?: Unable Difficulty moving from lying on back to sitting on the side of the bed? : Unable Difficulty sitting down on and standing up from a chair with arms (e.g., wheelchair, bedside commode, etc,.)?: Unable Help needed moving to and from a bed to chair (including a wheelchair)?: A Little Help needed walking in hospital room?: A Lot Help needed  climbing 3-5 steps with a railing? : A Lot 6 Click Score: 10    End of Session Equipment Utilized During Treatment: Gait belt;Oxygen Activity Tolerance: Patient tolerated treatment well Patient left: in chair;with chair alarm set;with SCD's reapplied Nurse Communication: Mobility status PT Visit Diagnosis: Difficulty in walking, not elsewhere classified (R26.2);Muscle weakness (generalized) (M62.81)    Time: 1443-1540 PT Time Calculation (min) (ACUTE ONLY): 17 min   Charges:   PT Evaluation $PT Eval Moderate Complexity: 1 Mod PT Treatments $Therapeutic Exercise: 8-22 mins   PT G Codes:   PT G-Codes **NOT FOR INPATIENT CLASS** Functional Assessment Tool Used: AM-PAC 6 Clicks Basic Mobility Functional Limitation: Mobility: Walking and moving around Mobility: Walking and Moving Around Current Status (G8676): At least 60 percent but less than 80 percent impaired, limited or restricted Mobility: Walking and Moving Around Goal Status [phone]): At least 40 percent but less than 60 percent impaired, limited or restricted    Greggory Stallion, PT, DPT [phone]   Lindsey Hobbs 09/12/2017, 3:24 PM

## 2017-09-12 NOTE — Consult Note (Signed)
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 She was sent to the ED via EMS from skilled nursing facility for a chief complaint of shortness of breath.    Clinical Assessment and Goals of Care: [name] is resting in bed this morning watching tv. She is alert and oriented and conversing appropriately. She states she has been staying at [facility] since her fall a little over a week ago.  She has bruising to her face. She states she is divorced and has 1 son Lindsey Manns "Butch". She has smoked since her 68's.    We discussed her diagnosis, prognosis, and GOC.  A detailed discussion was had today regarding advanced directives.  Concepts specific to code status, artifical feeding and hydration, IV antibiotics and rehospitalization was had.  She confirms DNR status. She states she would not want to be intubated and placed on a ventilator but stated " I would be okay with doing that" when asked about BiPAP if it were needed. She states she would not want a feeding tube. She is amenable to outpatient palliative care following who can continue to discuss goals of care.   MOST form completed. Attempted to reach [employer]" to update, no answer.    Natural trajectory and expectations at EOL were discussed.  Questions and concerns addressed.      PATIENT is Lindsey Hobbs. She has a POA, son Lindsey Manns if needed. She states she has a living will at home.     SUMMARY OF RECOMMENDATIONS    D/C to facility with palliative to follow to continue [place] conversations.   Code  Status/Advance Care Planning:  DNR  Additional Recommendations (Limitations, Scope, Preferences):  Would be willing to accept BiPAP, no ventilator.  Prognosis:   Unable to determine  Discharge Planning:Facility with palliative to follow     Primary Diagnoses: Present on Admission: . COPD exacerbation ([place])   I have reviewed the medical record, interviewed the patient and family, and examined the patient. The following aspects are pertinent.  Past Medical History:  Diagnosis Date  . Anemia   . Avascular necrosis of bone of hip ([place])   . CHF (congestive heart failure) ([place])   . COPD (chronic obstructive pulmonary disease) ([place])   . Crohn's disease ([place])   . DVT (deep venous thrombosis) ([place]) 06/2017  . Guillain Barr syndrome ([place])   . Hyperlipidemia   . Hypertension    Social History   Socioeconomic History  . Marital status: Widowed    Spouse name: None  . Number of children: None  . Years of education: None  . Highest education level: None  Social Needs  . Financial resource strain: None  . Food  insecurity - worry: None  . Food insecurity - inability: None  . Transportation needs - medical: None  . Transportation needs - non-medical: None  Occupational History  . None  Tobacco Use  . Smoking status: Current Every Day Smoker    Packs/day: 1.50    Years: 67.00    Pack years: 100.50  . Smokeless tobacco: Never Used  Substance and Sexual Activity  . Alcohol use: No  . Drug use: No  . Sexual activity: None  Other Topics Concern  . None  Social History Narrative  . None   Family History  Problem Relation Age of Onset  . Breast cancer Mother    Scheduled Meds: . atorvastatin  10 mg Oral Daily  . calcium-vitamin D  1 tablet Oral BID  . docusate sodium  100 mg Oral BID  . feeding supplement (ENSURE ENLIVE)  237 mL Oral BID BM  . folic acid  1 mg Oral Daily  . gabapentin  200 mg Oral BID  . heparin  5,000 Units Subcutaneous Q8H  . loratadine  10 mg  Oral Daily  . mesalamine  2.4 g Oral Q breakfast  . methylPREDNISolone (SOLU-MEDROL) injection  60 mg Intravenous Q24H  . multivitamin with minerals  1 tablet Oral Daily  . pantoprazole  40 mg Oral Daily   Continuous Infusions: . sodium chloride 50 mL/hr at 09/12/17 0530   PRN Meds:.acetaminophen **OR** acetaminophen, bisacodyl, loratadine, ondansetron **OR** ondansetron (ZOFRAN) IV, traZODone, zolpidem Medications Prior to Admission:  Prior to Admission medications   Medication Sig Start Date End Date Taking? Authorizing Provider  atorvastatin (LIPITOR) 10 MG tablet Take 1 tablet (10 mg total) by mouth daily. 08/08/17 08/08/18 Yes Dew, Erskine Squibb, MD  Calcium Carbonate-Vitamin D (CALCIUM 600+D) 600-400 MG-UNIT per tablet Take 1 tablet by mouth 2 (two) times daily.   Yes [provider]  ferrous sulfate 325 (65 FE) MG tablet Take 1 tablet (325 mg total) by mouth 2 (two) times daily with a meal. Patient taking differently: Take 325 mg by mouth daily.  07/08/15  Yes Sudini, Alveta Heimlich, MD  fexofenadine (ALLEGRA) 180 MG tablet Take 180 mg by mouth at bedtime.   Yes [provider]  folic acid (FOLVITE) 1 MG tablet Take 1 mg by mouth daily.   Yes [provider]  gabapentin (NEURONTIN) 100 MG capsule Take 200 mg by mouth. One in the morning and 2 at night   Yes [provider]  ipratropium-albuterol (DUONEB) 0.5-2.5 (3) MG/3ML SOLN Take 3 mLs by nebulization every 6 (six) hours as needed. 06/06/17  Yes Gladstone Lighter, MD  loratadine (CLARITIN) 10 MG tablet Take 10 mg by mouth daily as needed for allergies.   Yes [provider]  mesalamine (LIALDA) 1.2 g EC tablet Take 2.4 g by mouth daily with breakfast.    Yes [provider]  Multiple Vitamin (MULTIVITAMIN) tablet Take 1 tablet by mouth daily.   Yes [provider]  Omega-3 Fatty Acids (FISH OIL PO) Take 1 capsule by mouth daily.    Yes [provider]  pantoprazole (PROTONIX) 40  MG tablet Take 40 mg by mouth daily. 05/13/17  Yes [provider]  zolpidem (AMBIEN) 10 MG tablet Take 10 mg by mouth at bedtime as needed for sleep.   Yes [provider]  feeding supplement, ENSURE ENLIVE, (ENSURE ENLIVE) LIQD Take 237 mLs by mouth 2 (two) times daily between meals. 09/04/17   Fritzi Mandes, MD   Allergies  Allergen Reactions  .  Ace Inhibitors     Severe hyperkalemia and AKI   Review of Systems  All other systems reviewed and are negative.   Physical Exam  Constitutional: No distress.  HENT:  Bruising to face- Healing  Pulmonary/Chest: Effort normal.  O2 in place.  Neurological: She is alert.  Oriented    Vital Signs: BP (!) 115/59 (BP Location: Left Arm)   Pulse 82   Temp (!) 97.4 F (36.3 C) (Oral)   Resp 14   Ht 5\' 4"  (1.626 m)   Wt 58 kg (127 lb 13.9 oz)   SpO2 98%   BMI 21.95 kg/m  Pain Assessment: No/denies pain   Pain Score: 0-No pain   SpO2: SpO2: 98 % O2 Device:SpO2: 98 % O2 Flow Rate: .O2 Flow Rate (L/min): 2 L/min  IO: Intake/output summary:   Intake/Output Summary (Last 24 hours) at 09/12/2017 1053 Last data filed at 09/12/2017 0854 Gross per 24 hour  Intake 1045 ml  Output 300 ml  Net 745 ml    LBM:   Baseline Weight: Weight: 58 kg (127 lb 13.9 oz) Most recent weight: Weight: 58 kg (127 lb 13.9 oz)     Palliative Assessment/Data:      Time In: 10:30 Time Out: 11:20 Time Total: 50 min Greater than 50%  of this time was spent counseling and coordinating care related to the above assessment and plan.  Signed by: Asencion Gowda, NP   Please contact Palliative Medicine Team phone at [phone] for questions and concerns.  For individual provider: See Shea Evans

## 2017-09-12 NOTE — Progress Notes (Signed)
Date: 09/12/2017,   MRN# [id number] Lindsey Hobbs 07/29/1927 Code Status:     Code Status Orders  (From admission, onward)        Start     Ordered   09/11/17 0913  Do not attempt resuscitation (DNR)  Continuous    Question Answer Comment  In the event of cardiac or respiratory ARREST Do not call a "code blue"   In the event of cardiac or respiratory ARREST Do not perform Intubation, CPR, defibrillation or ACLS   In the event of cardiac or respiratory ARREST Use medication by any route, position, wound care, and other measures to relive pain and suffering. May use oxygen, suction and manual treatment of airway obstruction as needed for comfort.      09/11/17 0912    Code Status History    Date Active Date Inactive Code Status Order ID Comments User Context   09/01/2017 08:12 09/04/2017 20:34 DNR [id number]  Flora Lipps, MD Inpatient   09/01/2017 01:25 09/01/2017 08:12 Full Code [id number]  Gorden Harms, MD Inpatient   08/08/2017 12:12 08/08/2017 17:25 Full Code [id number]  Algernon Huxley, MD Inpatient   06/18/2017 15:38 06/24/2017 19:37 DNR [id number]  Theodoro Grist, MD ED   06/01/2017 15:00 06/06/2017 17:42 DNR [id number]  Baxter Hire, MD Inpatient   07/05/2015 17:33 07/08/2015 20:17 DNR [id number]  Vaughan Basta, MD Inpatient    Advance Directive Documentation     Most Recent Value  Type of Advance Directive  Healthcare Power of Attorney, Living will  Pre-existing out of facility DNR order (yellow form or pink MOST form)  Yellow form placed in chart (order not valid for inpatient use)  "MOST" Form in Place?  No data     Hosp day:@LENGTHOFSTAYDAYS @ Referring MD: @ATDPROV @      CC: copd evaluation/DNR  HPI: This is an 83 year old lady.  Hx of chron's dz, phx of GBS, copd, came in from skilled nursing facility sob, and productive cough. C/w copd exacerbation. Pulmonary asked to evaluate for any further intervention possible.   PMHX:   Past Medical History:  Diagnosis Date   . Anemia   . Avascular necrosis of bone of hip ([place])   . CHF (congestive heart failure) ([place])   . COPD (chronic obstructive pulmonary disease) ([place])   . Crohn's disease ([place])   . DVT (deep venous thrombosis) ([place]) 06/2017  . Guillain Barr syndrome ([place])   . Hyperlipidemia   . Hypertension    Surgical Hx:  Past Surgical History:  Procedure Laterality Date  . ABDOMINAL HYSTERECTOMY    . JOINT REPLACEMENT    . TOTAL HIP ARTHROPLASTY Left ~11 yrs ago   Family Hx:  Family History  Problem Relation Age of Onset  . Breast cancer Mother    Social Hx:   Social History   Tobacco Use  . Smoking status: Current Every Day Smoker    Packs/day: 1.50    Years: 67.00    Pack years: 100.50  . Smokeless tobacco: Never Used  Substance Use Topics  . Alcohol use: No  . Drug use: No   Medication:    Home Medication:    Current Medication: @CURMEDTAB @   Allergies:  Ace inhibitors  Review of Systems: Gen:  Denies  fever, sweats, chills HEENT: Denies blurred vision, double vision, ear pain, eye pain, hearing loss, nose bleeds, sore throat Cvc:  No dizziness, chest pain or heaviness Resp:  + ve cough and sob Gi: Denies swallowing difficulty, stomach pain,  nausea or vomiting, diarrhea, constipation, bowel incontinence Gu:  Denies bladder incontinence, burning urine Ext:   No Joint pain, stiffness or swelling Skin: No skin rash, easy bruising or bleeding or hives Endoc:  No polyuria, polydipsia , polyphagia or weight change Psych: No depression, insomnia or hallucinations  Other:  All other systems negative  Physical Examination:   VS: BP (!) 124/59 (BP Location: Left Arm)   Pulse 87   Temp (!) 97.5 F (36.4 C) (Oral)   Resp 20   Ht 5\' 4"  (1.626 m)   Wt 127 lb 13.9 oz (58 kg)   SpO2 98%   BMI 21.95 kg/m   General Appearance: No distress  Neuro: without focal findings, mental status, speech normal, alert and oriented, cranial nerves 2-12 intact, reflexes normal and  symmetric, sensation grossly normal  HEENT: PERRLA, EOM intact, no ptosis, no other lesions noticed Pulmonary:.distant wheezing, No rales     Cardiovascular:  Normal S1,S2.  No m/r/g.     Abdomen:Benign, Soft, non-tender, No masses, hepatosplenomegaly, No lymphadenopathy Endoc: No evident thyromegaly, no signs of acromegaly or Cushing features Skin:   warm, no rashes, no ecchymosis  Extremities: normal, no cyanosis, clubbing, no edema, warm with normal capillary refill.   Labs results:   Recent Labs    09/11/17 0328 09/12/17 0520  HGB 8.2* 7.1*  HCT 25.0* 22.3*  MCV 98.6 99.0  WBC 5.6 8.3  BUN 21* 26*  CREATININE 0.80 0.93  GLUCOSE 116* 111*  CALCIUM 9.1 8.6*  ,    Rad results:  EXAM: PORTABLE CHEST 1 VIEW  COMPARISON:  06/18/2017  FINDINGS: Small bilateral effusions. Bibasilar left greater than right consolidations. Mild cardiomegaly with minimal central congestion. Aortic atherosclerosis. No pneumothorax.  IMPRESSION: Small pleural effusions with bibasilar left greater than right atelectasis or infiltrates.   Electronically Signed   By: Donavan Foil M.D.   On: 09/11/2017 03:41  Assessment and Plan: Hx of copd exacerbation on appropriate regimen Will continue as is  Small pleural more so on the left, agrre to small to tap Will continue to observe  Anemia normocytic    I have personally obtained a history, examined the patient, evaluated laboratory and imaging results, formulated the assessment and plan and placed orders.  The Patient requires high complexity decision making for assessment and support, frequent evaluation and titration of therapies, application of advanced monitoring technologies and extensive interpretation of multiple databases.   Herbon Fleming,M.D. Pulmonary & Critical care Medicine [facility]

## 2017-09-12 NOTE — Progress Notes (Signed)
Initial Nutrition Assessment  DOCUMENTATION CODES:   Non-severe (moderate) malnutrition in context of chronic illness  INTERVENTION:   Premier Protein BID, each supplement provides 160 kcal and 30 grams of protein.   MVI liquid daily   Snacks  Liberalize diet  NUTRITION DIAGNOSIS:   Moderate Malnutrition related to chronic illness(COPD, CHF) as evidenced by mild fat depletion, moderate muscle depletion.  GOAL:   Patient will meet greater than or equal to 90% of their needs  MONITOR:   PO intake, Supplement acceptance, Labs, Weight trends, Skin, I & O's  REASON FOR ASSESSMENT:   Consult Assessment of nutrition requirement/status  ASSESSMENT:   83 year old female with PMHx of HTN, COPD, anemia, HLD, Guillain Barre syndrome, Crohn's disease, hx DVT, CHF, recent fall presents for COPD exacerbation    Met with pt in room today. Pt is a poor historian but reports good appetite today and pta. Pt eating 50% of meals at last admit one week ago and reports that she was eating well at home. Pt ate 100% of her breakfast today and drank 3/4 of an Ensure. Pt reports that she likes Boost not Ensure; we do not have Boost at [facility], pt is willing to try Premier Protein. Pt with h/o Crohns but reports no flare in a long time; pt is on mesalamine. RD will liberalize diet and change Ensure to Premier Protein. RD will also add high protein snacks. Per chart, pt with recent weight gain and appears wt stable since last admit.      Medications reviewed and include: Oscal with D, colace, folic acid, heparin, mesalamine, solu-medrol, MVI, protonix, NaCl _0 /hr  Labs reviewed: Na 147(H), BUN 26(H), Ca 8.6(L) P 3.2 wnl, Mg 1.9 wnl- 10/31 Hgb 7.1(L), Hct 22.3(L)  Nutrition-Focused physical exam completed. Findings are mild fat depletions in orbital and buccal regions, and chest, moderate muscle depletions in clavicles, shoulders, and BLE, severe muscle depletions in hands, and no edema. Pt noted to  have bruising on BLE and forehead r/t recent fall.   Diet Order:  Diet renal/carb modified with fluid restriction Diet-HS Snack? Nothing; Room service appropriate? Yes; Fluid consistency: Thin  EDUCATION NEEDS:   Education needs have been addressed  Skin: multiple abrasions, skin tears, and bruising s/p recent fall   Last BM:  11/8- type 6  Height:   Ht Readings from Last 1 Encounters:  09/11/17 _1  (1.626 m)    Weight:   Wt Readings from Last 1 Encounters:  09/11/17 127 lb 13.9 oz (58 kg)    Ideal Body Weight:  54.5 kg  BMI:  Body mass index is 21.95 kg/m.  Estimated Nutritional Needs:   Kcal:  1500-1700kcal/day   Protein:  75-86g/day   Fluid:  >1.5L/day   Koleen Distance MS, RD, LDN Pager #[phone] After Hours Pager: [phone]

## 2017-09-12 NOTE — Progress Notes (Signed)
OT Cancellation Note  Patient Details Name: Lindsey Hobbs MRN: [id number] DOB: 1927/10/25   Cancelled Treatment:    Reason Eval/Treat Not Completed: Other (comment). Order received, chart reviewed. Spoke with nursing who cleared OT to see. Pt receiving pt care upon attempt. Will re-attempt at later time as pt is available.   Jeni Salles, MPH, MS, OTR/L ascom [phone] 09/12/17, 8:44 AM

## 2017-09-12 NOTE — Progress Notes (Signed)
New referral for Out Patient Palliative at [facility] following discharge received from Palliative NP Asencion Gowda. [place] aware. Patient information faxed to referral. Thank you. Flo Shanks RN, BSN, [facility] [place], hospital Liaison [phone] c

## 2017-09-12 NOTE — NC FL2 (Signed)
[place] LEVEL OF CARE SCREENING TOOL     IDENTIFICATION  Patient Name: Lindsey Hobbs Birthdate: 1927/10/24 Sex: female Admission Date (Current Location): 09/11/2017  [facility] and [place] Number:  Engineering geologist and Address:  [facility], [address], [place], [place] [address]      Provider Number: [id number]  Attending Physician Name and Address:  Max Sane, MD  Relative Name and Phone Number:       Current Level of Care: Hospital Recommended Level of Care: [place] Prior Approval Number:    Date Approved/Denied:   PASRR Number:    Discharge Plan: SNF    Current Diagnoses: Patient Active Problem List   Diagnosis Date Noted  . Back pain   . Hyperkalemia   . Fall   . Acute renal failure (ARF) ([place]) 08/31/2017  . Hyperlipidemia 08/02/2017  . Atherosclerosis of native arteries of the extremities with ulceration ([place]) 08/02/2017  . Chronic systolic heart failure ([place]) 07/04/2017  . HTN (hypertension) 07/04/2017  . Tobacco use 07/04/2017  . COPD exacerbation ([place])   . Palliative care by specialist   . Goals of care, counseling/discussion   . Chronic deep vein thrombosis (DVT) of right lower extremity ([place]) 06/18/2017  . Dyspnea 06/18/2017  . Demand ischemia ([place]) 06/18/2017  . Tobacco abuse counseling 06/18/2017  . Right leg DVT ([place]) 06/18/2017  . Ulcerative colitis ([place])   . Anemia 06/04/2017  . Rectal bleed 06/02/2017  . GI bleed 07/05/2015    Orientation RESPIRATION BLADDER Height & Weight     Self, Time, Situation, Place  Normal, O2(2 liters) Incontinent Weight: 127 lb 13.9 oz (58 kg) Height:  5\' 4"  (162.6 cm)  BEHAVIORAL SYMPTOMS/MOOD NEUROLOGICAL BOWEL NUTRITION STATUS  (none) (none) Incontinent Diet(renal/carb modified)  AMBULATORY STATUS COMMUNICATION OF NEEDS Skin   Limited Assist Verbally Bruising                       Personal Care Assistance Level of Assistance   Dressing, Bathing Bathing Assistance: Limited assistance   Dressing Assistance: Limited assistance     Functional Limitations Info  Hearing   Hearing Info: Impaired      SPECIAL CARE FACTORS FREQUENCY  PT (By licensed PT)                    Contractures Contractures Info: Not present    Additional Factors Info  Code Status, Allergies Code Status Info: dnr Allergies Info: ace inhibitors           Current Medications (09/12/2017):  This is the current hospital active medication list Current Facility-Administered Medications  Medication Dose Route Frequency Provider Last Rate Last Dose  . 0.9 %  sodium chloride infusion   Intravenous Continuous Max Sane, MD 50 mL/hr at 09/12/17 0530    . acetaminophen (TYLENOL) tablet 650 mg  650 mg Oral Q6H PRN Max Sane, MD       Or  . acetaminophen (TYLENOL) suppository 650 mg  650 mg Rectal Q6H PRN Max Sane, MD      . atorvastatin (LIPITOR) tablet 10 mg  10 mg Oral Daily Max Sane, MD   10 mg at 09/12/17 0904  . bisacodyl (DULCOLAX) EC tablet 5 mg  5 mg Oral Daily PRN Max Sane, MD      . calcium-vitamin D (OSCAL WITH D) 500-200 MG-UNIT per tablet 1 tablet  1 tablet Oral BID Max Sane, MD   1 tablet  at 09/12/17 0907  . docusate sodium (COLACE) capsule 100 mg  100 mg Oral BID Max Sane, MD   100 mg at 09/12/17 0907  . feeding supplement (ENSURE ENLIVE) (ENSURE ENLIVE) liquid 237 mL  237 mL Oral BID BM Max Sane, MD   237 mL at 09/12/17 0908  . folic acid (FOLVITE) tablet 1 mg  1 mg Oral Daily Max Sane, MD   1 mg at 09/12/17 0905  . gabapentin (NEURONTIN) capsule 200 mg  200 mg Oral BID Max Sane, MD   200 mg at 09/12/17 0905  . heparin injection 5,000 Units  5,000 Units Subcutaneous Q8H Max Sane, MD   5,000 Units at 09/12/17 0530  . loratadine (CLARITIN) tablet 10 mg  10 mg Oral Daily Max Sane, MD   10 mg at 09/12/17 0906  . loratadine (CLARITIN) tablet 10 mg  10 mg Oral Daily PRN Max Sane, MD      .  mesalamine (LIALDA) EC tablet 2.4 g  2.4 g Oral Q breakfast Max Sane, MD   2.4 g at 09/12/17 0901  . methylPREDNISolone sodium succinate (SOLU-MEDROL) 125 mg/2 mL injection 60 mg  60 mg Intravenous Q24H Max Sane, MD   60 mg at 09/11/17 1302  . multivitamin with minerals tablet 1 tablet  1 tablet Oral Daily Max Sane, MD   1 tablet at 09/12/17 0907  . ondansetron (ZOFRAN) tablet 4 mg  4 mg Oral Q6H PRN Max Sane, MD       Or  . ondansetron (ZOFRAN) injection 4 mg  4 mg Intravenous Q6H PRN Max Sane, MD      . pantoprazole (PROTONIX) EC tablet 40 mg  40 mg Oral Daily Max Sane, MD   40 mg at 09/12/17 0906  . traZODone (DESYREL) tablet 25 mg  25 mg Oral QHS PRN Max Sane, MD      . zolpidem (AMBIEN) tablet 5 mg  5 mg Oral QHS PRN Max Sane, MD   5 mg at 09/11/17 2110     Discharge Medications: Please see discharge summary for a list of discharge medications.  Relevant Imaging Results:  Relevant Lab Results:   Additional Information    Shela Leff, LCSW

## 2017-09-12 NOTE — Evaluation (Signed)
Occupational Therapy Evaluation Patient Details Name: Lindsey Hobbs MRN: [id number] DOB: 1927/07/12 Today's Date: 09/12/2017    History of Present Illness Pt is an 83 year old Caucasian female with a past medical history that includes anemia, avascular necrosis of the hip, CHF, COPD, Crohn's Dz, DVT, Guillain Barre syndrome, HLD, and HTN who presented to the ED from short term rehab with SOB. Recent admission due to mechanical fall at home down flight of stairs.    Clinical Impression   Pt is 83 year old female pho presents to [facility] from STR with SOB and with history of COPD.  Pt currently requires min-mod assist for ADLs due to SOB, decreased endurance for functional tasks and at risk for falls. Pt educated in pursed lip breathing to support SOB, pt verbalized understanding. Would benefit from additional training to maximize recall and carryover of learned techniques. Pt would benefit from skilled OT services to increase independence in ADLs, education in energy conservation techniques, pursed lip breathing and recommendations for home modifications to increase safety and prevent falls. Recommend transition back to STR to continue therapy in order to facilitate safe eventual return home.    Follow Up Recommendations  SNF    Equipment Recommendations  None recommended by OT    Recommendations for Other Services       Precautions / Restrictions Precautions Precautions: Fall Restrictions Weight Bearing Restrictions: No      Mobility Bed Mobility               General bed mobility comments: deferred, pt up in recliner for session  Transfers                 General transfer comment: pt declined     Balance Overall balance assessment: Needs assistance Sitting-balance support: No upper extremity supported Sitting balance-Leahy Scale: Good                                     ADL either performed or assessed with clinical judgement   ADL Overall ADL's  : Needs assistance/impaired Eating/Feeding: Sitting;Set up   Grooming: Sitting;Set up   Upper Body Bathing: Sitting;Set up;Supervision/ safety   Lower Body Bathing: Sit to/from stand;Minimal assistance   Upper Body Dressing : Sitting;Supervision/safety   Lower Body Dressing: Sit to/from stand;Minimal assistance                       Vision Baseline Vision/History: Wears glasses Wears Glasses: Reading only Patient Visual Report: No change from baseline Vision Assessment?: No apparent visual deficits     Perception     Praxis      Pertinent Vitals/Pain Pain Assessment: No/denies pain     Hand Dominance Right   Extremity/Trunk Assessment Upper Extremity Assessment Upper Extremity Assessment: Generalized weakness   Lower Extremity Assessment Lower Extremity Assessment: Generalized weakness   Cervical / Trunk Assessment Cervical / Trunk Assessment: Normal   Communication Communication Communication: No difficulties   Cognition Arousal/Alertness: Awake/alert Behavior During Therapy: WFL for tasks assessed/performed Overall Cognitive Status: Within Functional Limits for tasks assessed                                     General Comments       Exercises Other Exercises Other Exercises: Pt educated in pursed lip breathing to support SOB, pt verbalized  understanding.   Shoulder Instructions      Home Living Family/patient expects to be discharged to:: Private residence Living Arrangements: Alone Available Help at Discharge: Family;Available PRN/intermittently(son nearby can help PRN) Type of Home: House(townhome) Home Access: Stairs to enter Entrance Stairs-Number of Steps: 6 Entrance Stairs-Rails: Right;Left;Can reach both Home Layout: Two level;1/2 bath on main level;Bed/bath upstairs   Alternate Level Stairs-Rails: Right;Left;Can reach both Bathroom Shower/Tub: Tub/shower unit(on 2nd fl)   Bathroom Toilet: Handicapped height      Home Equipment: Environmental consultant - 2 wheels;Cane - single point;Shower seat;Hand held shower head;Adaptive equipment Adaptive Equipment: Reacher        Prior Functioning/Environment Level of Independence: Needs assistance  Gait / Transfers Assistance Needed: prior to STR from recent previous admission, pt reports Mod I with amb with SPC ADL's / Homemaking Assistance Needed: prior to STR from recent previous admission, pt reports indep with bathing, dressing, light meal prep; had HHA assist with medication mgt, groceries, cleaning, and driving H8/NI   Comments: Pt reports 1 recent fall in past 12 months (fell secondary to sock slipping on step while descending steps out of home)        OT Problem List: Decreased strength;Decreased activity tolerance;Cardiopulmonary status limiting activity;Decreased safety awareness;Impaired balance (sitting and/or standing)      OT Treatment/Interventions: Self-care/ADL training;Therapeutic exercise;DME and/or AE instruction;Energy conservation;Patient/family education;Therapeutic activities    OT Goals(Current goals can be found in the care plan section) Acute Rehab OT Goals Patient Stated Goal: To get stronger OT Goal Formulation: With patient Time For Goal Achievement: 09/26/17 Potential to Achieve Goals: Good ADL Goals Pt Will Perform Lower Body Dressing: with modified independence;sit to/from stand(Pt will be modified indep w/ LB dressing with no SOB.) Pt Will Transfer to Toilet: with supervision(Pt will perform toilet t/f with supervision, LRAD for amb) Additional ADL Goal #1: Pt will verbalize plan for implementing at least 1 learned ECS to support functional independence and safety for ADL tasks.  OT Frequency: Min 2X/week   Barriers to D/C: Inaccessible home environment;Decreased caregiver support          Co-evaluation              AM-PAC PT "6 Clicks" Daily Activity     Outcome Measure Help from another person eating meals?: A  Little Help from another person taking care of personal grooming?: A Little Help from another person toileting, which includes using toliet, bedpan, or urinal?: A Little Help from another person bathing (including washing, rinsing, drying)?: A Little Help from another person to put on and taking off regular upper body clothing?: A Little Help from another person to put on and taking off regular lower body clothing?: A Little 6 Click Score: 18   End of Session    Activity Tolerance: Patient tolerated treatment well Patient left: in chair;with call bell/phone within reach;with chair alarm set;with nursing/sitter in room  OT Visit Diagnosis: Other abnormalities of gait and mobility (R26.89);Muscle weakness (generalized) (M62.81);History of falling (Z91.81)                Time: 7782-4235 OT Time Calculation (min): 12 min Charges:  OT General Charges $OT Visit: 1 Visit OT Evaluation $OT Eval Low Complexity: 1 Low G-Codes: OT G-codes **NOT FOR INPATIENT CLASS** Functional Assessment Tool Used: AM-PAC 6 Clicks Daily Activity;Clinical judgement Functional Limitation: Self care Self Care Current Status (T6144): At least 40 percent but less than 60 percent impaired, limited or restricted Self Care Goal Status (R1540): At least 20 percent  but less than 40 percent impaired, limited or restricted   Jeni Salles, MPH, MS, OTR/L ascom [phone] 09/12/17, 2:01 PM

## 2017-09-12 NOTE — Progress Notes (Signed)
OT Cancellation Note  Patient Details Name: Lindsey Hobbs MRN: [id number] DOB: 09-Jan-1927   Cancelled Treatment:    Reason Eval/Treat Not Completed: Patient declined, no reason specified. On 2nd attempt, pt eating breakfast, requiring OT come back in afternoon. Will re-attempt at later date/time as pt is available, and as schedule permits.   Jeni Salles, MPH, MS, OTR/L ascom [phone] 09/12/17, 9:41 AM

## 2017-09-13 DIAGNOSIS — R0600 Dyspnea, unspecified: Secondary | ICD-10-CM | POA: Diagnosis not present

## 2017-09-13 DIAGNOSIS — M87052 Idiopathic aseptic necrosis of left femur: Secondary | ICD-10-CM | POA: Diagnosis not present

## 2017-09-13 DIAGNOSIS — Z7401 Bed confinement status: Secondary | ICD-10-CM | POA: Diagnosis not present

## 2017-09-13 DIAGNOSIS — E785 Hyperlipidemia, unspecified: Secondary | ICD-10-CM | POA: Diagnosis not present

## 2017-09-13 DIAGNOSIS — I5020 Unspecified systolic (congestive) heart failure: Secondary | ICD-10-CM | POA: Diagnosis not present

## 2017-09-13 DIAGNOSIS — K50911 Crohn's disease, unspecified, with rectal bleeding: Secondary | ICD-10-CM | POA: Diagnosis not present

## 2017-09-13 DIAGNOSIS — F17200 Nicotine dependence, unspecified, uncomplicated: Secondary | ICD-10-CM | POA: Diagnosis not present

## 2017-09-13 DIAGNOSIS — K5090 Crohn's disease, unspecified, without complications: Secondary | ICD-10-CM | POA: Diagnosis not present

## 2017-09-13 DIAGNOSIS — Z9981 Dependence on supplemental oxygen: Secondary | ICD-10-CM | POA: Diagnosis not present

## 2017-09-13 DIAGNOSIS — I2510 Atherosclerotic heart disease of native coronary artery without angina pectoris: Secondary | ICD-10-CM | POA: Diagnosis not present

## 2017-09-13 DIAGNOSIS — S81812A Laceration without foreign body, left lower leg, initial encounter: Secondary | ICD-10-CM | POA: Diagnosis not present

## 2017-09-13 DIAGNOSIS — J449 Chronic obstructive pulmonary disease, unspecified: Secondary | ICD-10-CM | POA: Diagnosis not present

## 2017-09-13 DIAGNOSIS — D649 Anemia, unspecified: Secondary | ICD-10-CM | POA: Diagnosis not present

## 2017-09-13 DIAGNOSIS — E039 Hypothyroidism, unspecified: Secondary | ICD-10-CM | POA: Diagnosis not present

## 2017-09-13 DIAGNOSIS — M6281 Muscle weakness (generalized): Secondary | ICD-10-CM | POA: Diagnosis not present

## 2017-09-13 DIAGNOSIS — J90 Pleural effusion, not elsewhere classified: Secondary | ICD-10-CM | POA: Diagnosis not present

## 2017-09-13 DIAGNOSIS — Z66 Do not resuscitate: Secondary | ICD-10-CM | POA: Diagnosis not present

## 2017-09-13 DIAGNOSIS — I5022 Chronic systolic (congestive) heart failure: Secondary | ICD-10-CM | POA: Diagnosis not present

## 2017-09-13 DIAGNOSIS — J9611 Chronic respiratory failure with hypoxia: Secondary | ICD-10-CM | POA: Diagnosis not present

## 2017-09-13 DIAGNOSIS — Z9911 Dependence on respirator [ventilator] status: Secondary | ICD-10-CM | POA: Diagnosis not present

## 2017-09-13 DIAGNOSIS — K219 Gastro-esophageal reflux disease without esophagitis: Secondary | ICD-10-CM | POA: Diagnosis not present

## 2017-09-13 DIAGNOSIS — G4700 Insomnia, unspecified: Secondary | ICD-10-CM | POA: Diagnosis not present

## 2017-09-13 DIAGNOSIS — I110 Hypertensive heart disease with heart failure: Secondary | ICD-10-CM | POA: Diagnosis not present

## 2017-09-13 DIAGNOSIS — K50919 Crohn's disease, unspecified, with unspecified complications: Secondary | ICD-10-CM | POA: Diagnosis not present

## 2017-09-13 DIAGNOSIS — G610 Guillain-Barre syndrome: Secondary | ICD-10-CM | POA: Diagnosis not present

## 2017-09-13 DIAGNOSIS — J441 Chronic obstructive pulmonary disease with (acute) exacerbation: Secondary | ICD-10-CM | POA: Diagnosis not present

## 2017-09-13 DIAGNOSIS — I509 Heart failure, unspecified: Secondary | ICD-10-CM | POA: Diagnosis not present

## 2017-09-13 DIAGNOSIS — R0902 Hypoxemia: Secondary | ICD-10-CM | POA: Diagnosis not present

## 2017-09-13 DIAGNOSIS — I248 Other forms of acute ischemic heart disease: Secondary | ICD-10-CM | POA: Diagnosis not present

## 2017-09-13 LAB — PROCALCITONIN: Procalcitonin: <0.1 ng/mL

## 2017-09-13 LAB — GLUCOSE, CAPILLARY: GLUCOSE-CAPILLARY: 89 mg/dL (ref 65–99)

## 2017-09-13 MED ORDER — PREDNISONE 50 MG PO TABS: 50.0000 mg | ORAL_TABLET | Freq: Every day | ORAL | Status: DC

## 2017-09-13 MED ORDER — PREDNISONE 50 MG PO TABS: ORAL_TABLET | ORAL | 0 refills | Status: AC

## 2017-09-13 NOTE — Discharge Summary (Signed)
[facility] [place] [facility] [place] NAME: Lindsey Hobbs    MR#:  [id number]  DATE OF BIRTH:  28-Jul-1927  DATE OF ADMISSION:  09/11/2017 ADMITTING PHYSICIAN: Max Sane, MD  DATE OF DISCHARGE: 09/13/2017  PRIMARY CARE PHYSICIAN: Cletis Athens, MD    ADMISSION DIAGNOSIS:  Shortness of breath [R06.02] Pleural effusion [J90] Hypoxia [R09.02] Elevated troponin [R74.8] COPD exacerbation (HCC) [J44.1] HCAP (healthcare-associated pneumonia) [J18.9]  DISCHARGE DIAGNOSIS:  Acute on Chronic COPD exacerbation---improving  SECONDARY DIAGNOSIS:   Past Medical History:  Diagnosis Date  . Anemia   . Avascular necrosis of bone of hip ([place])   . CHF (congestive heart failure) ([place])   . COPD (chronic obstructive pulmonary disease) ([place])   . Crohn's disease ([place])   . DVT (deep venous thrombosis) ([place]) 06/2017  . Guillain Barr syndrome ([place])   . Hyperlipidemia   . Hypertension     HOSPITAL COURSE:   83 year old Female with above-mentioned medical problem being admitted for COPD exacerbation  *COPD exacerbation, acute on chornic - continue IV steroids and nebulizer breathing treatment -change to oral steroids - pro-calcitonin neg - no need of Abx. -wean to RA as tolerated---this can be done at rehab also  *Elevated troponin - due to demand ischemia  *Crohn's disease Without exacerbation Stable Continue mesalamine  *Pleural effusion -Seems very small, doubt thoracentesis needed - Lasix prn  Overall at baseline D/c to rehab  CONSULTS OBTAINED:  Treatment Team:  Erby Pian, MD  DRUG ALLERGIES:   Allergies  Allergen Reactions  . Ace Inhibitors     Severe hyperkalemia and AKI    DISCHARGE MEDICATIONS:   Current Discharge Medication List    START taking these medications   Details  predniSONE (DELTASONE) 50 MG tablet Take 50 mg taper by 10 mg then stop Qty: 15 tablet, Refills: 0      CONTINUE these medications  which have NOT CHANGED   Details  atorvastatin (LIPITOR) 10 MG tablet Take 1 tablet (10 mg total) by mouth daily. Qty: 30 tablet, Refills: 11    Calcium Carbonate-Vitamin D (CALCIUM 600+D) 600-400 MG-UNIT per tablet Take 1 tablet by mouth 2 (two) times daily.    ferrous sulfate 325 (65 FE) MG tablet Take 1 tablet (325 mg total) by mouth 2 (two) times daily with a meal. Qty: 180 tablet, Refills: 0    fexofenadine (ALLEGRA) 180 MG tablet Take 180 mg by mouth at bedtime.    folic acid (FOLVITE) 1 MG tablet Take 1 mg by mouth daily.    gabapentin (NEURONTIN) 100 MG capsule Take 200 mg by mouth. One in the morning and 2 at night    ipratropium-albuterol (DUONEB) 0.5-2.5 (3) MG/3ML SOLN Take 3 mLs by nebulization every 6 (six) hours as needed. Qty: 360 mL, Refills: 0    loratadine (CLARITIN) 10 MG tablet Take 10 mg by mouth daily as needed for allergies.    mesalamine (LIALDA) 1.2 g EC tablet Take 2.4 g by mouth daily with breakfast.     Multiple Vitamin (MULTIVITAMIN) tablet Take 1 tablet by mouth daily.    Omega-3 Fatty Acids (FISH OIL PO) Take 1 capsule by mouth daily.     pantoprazole (PROTONIX) 40 MG tablet Take 40 mg by mouth daily.    zolpidem (AMBIEN) 10 MG tablet Take 10 mg by mouth at bedtime as needed for sleep.    feeding supplement, ENSURE ENLIVE, (ENSURE ENLIVE) LIQD Take 237 mLs by mouth 2 (two) times daily between  meals. Qty: 237 mL, Refills: 12        If you experience worsening of your admission symptoms, develop shortness of breath, life threatening emergency, suicidal or homicidal thoughts you must seek medical attention immediately by calling 911 or calling your MD immediately  if symptoms less severe.  You Must read complete instructions/literature along with all the possible adverse reactions/side effects for all the Medicines you take and that have been prescribed to you. Take any new Medicines after you have completely understood and accept all the possible  adverse reactions/side effects.   Please note  You were cared for by a hospitalist during your hospital stay. If you have any questions about your discharge medications or the care you received while you were in the hospital after you are discharged, you can call the unit and asked to speak with the hospitalist on call if the hospitalist that took care of you is not available. Once you are discharged, your primary care physician will handle any further medical issues. Please note that NO REFILLS for any discharge medications will be authorized once you are discharged, as it is imperative that you return to your primary care physician (or establish a relationship with a primary care physician if you do not have one) for your aftercare needs so that they can reassess your need for medications and monitor your lab values. Today   SUBJECTIVE   No respiratory distress. Ate good BF  VITAL SIGNS:  Blood pressure (!) 126/59, pulse 80, temperature 97.8 F (36.6 C), resp. rate 14, height 5\' 4"  (1.626 m), weight 58.1 kg (128 lb), SpO2 (!) 86 %.  I/O:    Intake/Output Summary (Last 24 hours) at 09/13/2017 1046 Last data filed at 09/13/2017 1021 Gross per 24 hour  Intake 959 ml  Output 0 ml  Net 959 ml    PHYSICAL EXAMINATION:  GENERAL:  83 y.o.-year-old patient lying in the bed with no acute distress. Thin, fraile EYES: Pupils equal, round, reactive to light and accommodation. No scleral icterus. Extraocular muscles intact.  HEENT: Head atraumatic, normocephalic. Oropharynx and nasopharynx clear. Old left forehead bruise NECK:  Supple, no jugular venous distention. No thyroid enlargement, no tenderness.  LUNGS: Normal breath sounds bilaterally, no wheezing, rales,rhonchi or crepitation. No use of accessory muscles of respiration.  CARDIOVASCULAR: S1, S2 normal. No murmurs, rubs, or gallops.  ABDOMEN: Soft, non-tender, non-distended. Bowel sounds present. No organomegaly or mass.  EXTREMITIES: No  pedal edema, cyanosis, or clubbing.  NEUROLOGIC: Cranial nerves II through XII are intact. Muscle strength 5/5 in all extremities. Sensation intact. Gait not checked.  PSYCHIATRIC: The patient is alert and oriented x 3.  SKIN: No obvious rash, lesion, or ulcer.   DATA REVIEW:   CBC  Recent Labs  Lab 09/12/17 0520  WBC 8.3  HGB 7.1*  HCT 22.3*  PLT 388    Chemistries  Recent Labs  Lab 09/12/17 0520  NA 147*  K 4.5  CL 111  CO2 29  GLUCOSE 111*  BUN 26*  CREATININE 0.93  CALCIUM 8.6*    Microbiology Results   Recent Results (from the past 240 hour(s))  Culture, blood (routine x 2)     Status: None (Preliminary result)   Collection Time: 09/11/17  3:34 AM  Result Value Ref Range Status   Specimen Description BLOOD LEFT FA  Final   Special Requests   Final    BOTTLES DRAWN AEROBIC AND ANAEROBIC Blood Culture adequate volume   Culture NO GROWTH 2  DAYS  Final   Report Status PENDING  Incomplete  Culture, blood (routine x 2)     Status: None (Preliminary result)   Collection Time: 09/11/17  3:37 AM  Result Value Ref Range Status   Specimen Description BLOOD RIGHT FA  Final   Special Requests   Final    BOTTLES DRAWN AEROBIC AND ANAEROBIC Blood Culture results may not be optimal due to an inadequate volume of blood received in culture bottles   Culture NO GROWTH 2 DAYS  Final   Report Status PENDING  Incomplete    RADIOLOGY:  No results found.   Management plans discussed with the patient, family and they are in agreement.  CODE STATUS:     Code Status Orders  (From admission, onward)        Start     Ordered   09/11/17 0913  Do not attempt resuscitation (DNR)  Continuous    Question Answer Comment  In the event of cardiac or respiratory ARREST Do not call a "code blue"   In the event of cardiac or respiratory ARREST Do not perform Intubation, CPR, defibrillation or ACLS   In the event of cardiac or respiratory ARREST Use medication by any route,  position, wound care, and other measures to relive pain and suffering. May use oxygen, suction and manual treatment of airway obstruction as needed for comfort.      09/11/17 0912    Code Status History    Date Active Date Inactive Code Status Order ID Comments User Context   09/01/2017 08:12 09/04/2017 20:34 DNR [id number]  Flora Lipps, MD Inpatient   09/01/2017 01:25 09/01/2017 08:12 Full Code [id number]  Gorden Harms, MD Inpatient   08/08/2017 12:12 08/08/2017 17:25 Full Code [id number]  Algernon Huxley, MD Inpatient   06/18/2017 15:38 06/24/2017 19:37 DNR [id number]  Theodoro Grist, MD ED   06/01/2017 15:00 06/06/2017 17:42 DNR [id number]  Baxter Hire, MD Inpatient   07/05/2015 17:33 07/08/2015 20:17 DNR [id number]  Vaughan Basta, MD Inpatient    Advance Directive Documentation     Most Recent Value  Type of Advance Directive  Healthcare Power of Attorney, Living will  Pre-existing out of facility DNR order (yellow form or pink MOST form)  Yellow form placed in chart (order not valid for inpatient use)  "MOST" Form in Place?  No data      TOTAL TIME TAKING CARE OF THIS PATIENT: *40* minutes.    Elesia Pemberton M.D on 09/13/2017 at 10:46 AM  Between 7am to 6pm - Pager - [phone] After 6pm go to [email or web address] - password [email or web address] [facility]  Office  [phone]  CC: Primary care physician; Cletis Athens, MD

## 2017-09-13 NOTE — Progress Notes (Signed)
Discharge order received. Patient is alert and oriented. Vital signs stable . No signs of acute distress. Discharge instructions given. Report given to Levada Dy at [facility].Patient verbalized understanding. No other issues noted at this time.

## 2017-09-13 NOTE — Progress Notes (Signed)
Left a message to son about patient discharge today back to [facility]. Awaiting for a return call  for any questions.

## 2017-09-13 NOTE — Clinical Social Work Note (Addendum)
Patient discharged today by MD and Magda Paganini at [facility] is aware. Discharge information sent to [facility]. Patient aware of discharge and in agreement. Nurse to contact patient's son to notify of discharge. Shela Leff MSW,LCSW  [phone]

## 2017-09-15 NOTE — Progress Notes (Deleted)
Cardiology Office Note  Date:  09/15/2017   ID:  Lindsey Hobbs, DOB 02/21/1927, MRN [id number]  PCP:  Cletis Athens, MD   No chief complaint on file.   HPI:  83 y.o. female with history of Crohn's disease with  GI bleed in 05/2017,  posthemorrhagic anemia requiring blood transfusion,  COPD,  ongoing tobacco abuse,  History of DVT Renal failure History of falls Anemia HTN,  HLD  Peripheral arterial disease with ulceration left lower extremity, managed by Dr. Lucky Cowboy PTCA and stenting to left lower extremity August 08, 2017 who presented to [facility] August 2018 with generalized weakness, SOB, PND, orthopnea, and LE swelling acute systolic CHF.  Moderate to severe MR and TR secondary to dilated cardiomyopathy, CHF COPD exacerbation, hospital admission November 2018 Who presents to establish care in the [facility] office for cardiomyopathy  sent to the ED November 2018 via EMS from skilled nursing facility for a chief complaint of shortness of breath.  She reports progressive shortness of breath and productive cough   Hospital admission August 2018 Stress test showing small region of fixed defect, small region of ischemia Managed medically given history of GI bleeding  Echocardiogram June 18, 2017 Ejection fraction 35-40% Moderate MR, moderate TR   PMH:   has a past medical history of Anemia, Avascular necrosis of bone of hip ([place]), CHF (congestive heart failure) ([place]), COPD (chronic obstructive pulmonary disease) ([place]), Crohn's disease ([place]), DVT (deep venous thrombosis) ([place]) (06/2017), Guillain Barr syndrome ([place]), Hyperlipidemia, and Hypertension.  PSH:    Past Surgical History:  Procedure Laterality Date  . ABDOMINAL HYSTERECTOMY    . JOINT REPLACEMENT    . TOTAL HIP ARTHROPLASTY Left ~11 yrs ago    Current Outpatient Medications  Medication Sig Dispense Refill  . atorvastatin (LIPITOR) 10 MG tablet Take 1 tablet (10 mg total) by mouth daily. 30 tablet 11  . Calcium  Carbonate-Vitamin D (CALCIUM 600+D) 600-400 MG-UNIT per tablet Take 1 tablet by mouth 2 (two) times daily.    . feeding supplement, ENSURE ENLIVE, (ENSURE ENLIVE) LIQD Take 237 mLs by mouth 2 (two) times daily between meals. 237 mL 12  . ferrous sulfate 325 (65 FE) MG tablet Take 1 tablet (325 mg total) by mouth 2 (two) times daily with a meal. (Patient taking differently: Take 325 mg by mouth daily. ) 180 tablet 0  . fexofenadine (ALLEGRA) 180 MG tablet Take 180 mg by mouth at bedtime.    . folic acid (FOLVITE) 1 MG tablet Take 1 mg by mouth daily.    [name] gabapentin (NEURONTIN) 100 MG capsule Take 200 mg by mouth. One in the morning and 2 at night    . ipratropium-albuterol (DUONEB) 0.5-2.5 (3) MG/3ML SOLN Take 3 mLs by nebulization every 6 (six) hours as needed. 360 mL 0  . loratadine (CLARITIN) 10 MG tablet Take 10 mg by mouth daily as needed for allergies.    . mesalamine (LIALDA) 1.2 g EC tablet Take 2.4 g by mouth daily with breakfast.     . Multiple Vitamin (MULTIVITAMIN) tablet Take 1 tablet by mouth daily.    . Omega-3 Fatty Acids (FISH OIL PO) Take 1 capsule by mouth daily.     . pantoprazole (PROTONIX) 40 MG tablet Take 40 mg by mouth daily.    . predniSONE (DELTASONE) 50 MG tablet Take 50 mg taper by 10 mg then stop 15 tablet 0  . zolpidem (AMBIEN) 10 MG tablet Take 10 mg by mouth at bedtime as needed for  sleep.     No current facility-administered medications for this visit.      Allergies:   Ace inhibitors   Social History:  The patient  reports that she has been smoking.  She has a 100.50 pack-year smoking history. she has never used smokeless tobacco. She reports that she does not drink alcohol or use drugs.   Family History:   family history includes Breast cancer in her mother.    Review of Systems: ROS   PHYSICAL EXAM: VS:  There were no vitals taken for this visit. , BMI There is no height or weight on file to calculate BMI. GEN: Well nourished, well developed, in no  acute distress HEENT: normal Neck: no JVD, carotid bruits, or masses Cardiac: RRR; no murmurs, rubs, or gallops,no edema  Respiratory:  clear to auscultation bilaterally, normal work of breathing GI: soft, nontender, nondistended, + BS MS: no deformity or atrophy Skin: warm and dry, no rash Neuro:  Strength and sensation are intact Psych: euthymic mood, full affect    Recent Labs: 06/18/2017: B Natriuretic Peptide 2,670.0; TSH 2.047 08/31/2017: ALT 9 09/04/2017: Magnesium 1.9 09/12/2017: BUN 26; Creatinine, Ser 0.93; Hemoglobin 7.1; Platelets 388; Potassium 4.5; Sodium 147    Lipid Panel No results found for: CHOL, HDL, LDLCALC, TRIG    Wt Readings from Last 3 Encounters:  09/13/17 128 lb (58.1 kg)  09/03/17 126 lb 8.7 oz (57.4 kg)  08/02/17 110 lb 6.4 oz (50.1 kg)       ASSESSMENT AND PLAN:  No diagnosis found.   Disposition:   F/U  6 months  No orders of the defined types were placed in this encounter.    Signed, Esmond Plants, M.D., Ph.D. 09/15/2017  [place], [place]

## 2017-09-16 ENCOUNTER — Ambulatory Visit: Payer: Medicare Other | Admitting: Cardiovascular Disease

## 2017-09-16 LAB — CULTURE, BLOOD (ROUTINE X 2)
CULTURE: NO GROWTH
Culture: NO GROWTH
Special Requests: ADEQUATE

## 2017-09-17 ENCOUNTER — Encounter: Payer: Self-pay | Admitting: Cardiovascular Disease

## 2017-09-17 ENCOUNTER — Encounter (INDEPENDENT_AMBULATORY_CARE_PROVIDER_SITE_OTHER): Payer: Medicare Other

## 2017-09-17 ENCOUNTER — Other Ambulatory Visit (INDEPENDENT_AMBULATORY_CARE_PROVIDER_SITE_OTHER): Payer: Self-pay | Admitting: Vascular Surgery

## 2017-09-17 ENCOUNTER — Ambulatory Visit (INDEPENDENT_AMBULATORY_CARE_PROVIDER_SITE_OTHER): Payer: Medicare Other | Admitting: Vascular Surgery

## 2017-09-17 DIAGNOSIS — J9611 Chronic respiratory failure with hypoxia: Secondary | ICD-10-CM | POA: Diagnosis not present

## 2017-09-17 DIAGNOSIS — I7090 Unspecified atherosclerosis: Secondary | ICD-10-CM

## 2017-09-17 DIAGNOSIS — J441 Chronic obstructive pulmonary disease with (acute) exacerbation: Secondary | ICD-10-CM | POA: Diagnosis not present

## 2017-09-17 DIAGNOSIS — I5022 Chronic systolic (congestive) heart failure: Secondary | ICD-10-CM | POA: Diagnosis not present

## 2017-09-19 DIAGNOSIS — I5022 Chronic systolic (congestive) heart failure: Secondary | ICD-10-CM | POA: Diagnosis not present

## 2017-09-19 DIAGNOSIS — J441 Chronic obstructive pulmonary disease with (acute) exacerbation: Secondary | ICD-10-CM | POA: Diagnosis not present

## 2017-10-02 DIAGNOSIS — J449 Chronic obstructive pulmonary disease, unspecified: Secondary | ICD-10-CM | POA: Diagnosis not present

## 2017-10-02 DIAGNOSIS — G4700 Insomnia, unspecified: Secondary | ICD-10-CM | POA: Diagnosis not present

## 2017-10-02 DIAGNOSIS — S81812A Laceration without foreign body, left lower leg, initial encounter: Secondary | ICD-10-CM | POA: Diagnosis not present

## 2017-10-02 DIAGNOSIS — I509 Heart failure, unspecified: Secondary | ICD-10-CM | POA: Diagnosis not present

## 2017-12-06 DEATH — deceased

## 2019-02-01 IMAGING — CT CT CERVICAL SPINE W/O CM
3 of 7 series · 11 of 33 positions shown, 13 images · non-contrast
Comparison: 08/30/2009

CLINICAL DATA: Fell downstairs

EXAM:
CT HEAD WITHOUT CONTRAST
CT CERVICAL SPINE WITHOUT CONTRAST
TECHNIQUE: Multidetector CT imaging of the head and cervical spine was
performed following the standard protocol without intravenous
contrast. Multiplanar CT image reconstructions of the cervical spine
were also generated.

[Series 5: c spine soft · axial · 0.35mm/px · z∈[-234,-162]mm · 3 of 72 slices shown]
[im 18/72  soft-tissue]
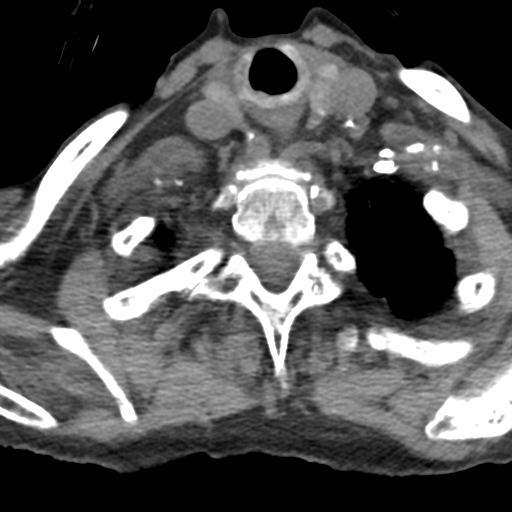
[im 36/72  soft-tissue]
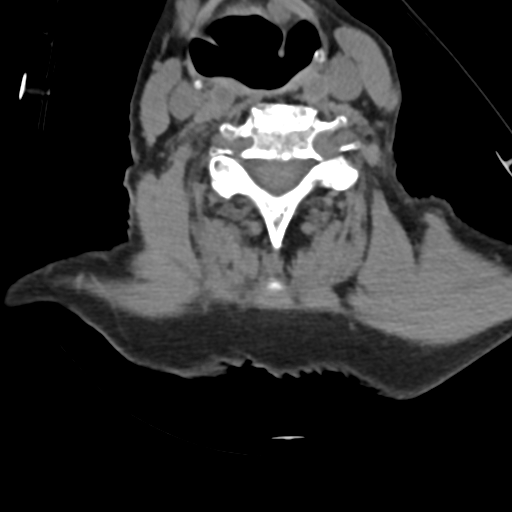
[im 54/72  soft-tissue]
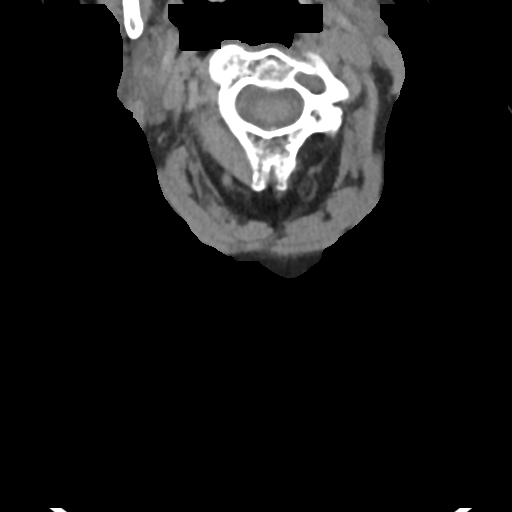

[Series 11: coronal bone · coronal · 0.19mm/px · 3 of 52 slices shown]
[im 13/52  bone]
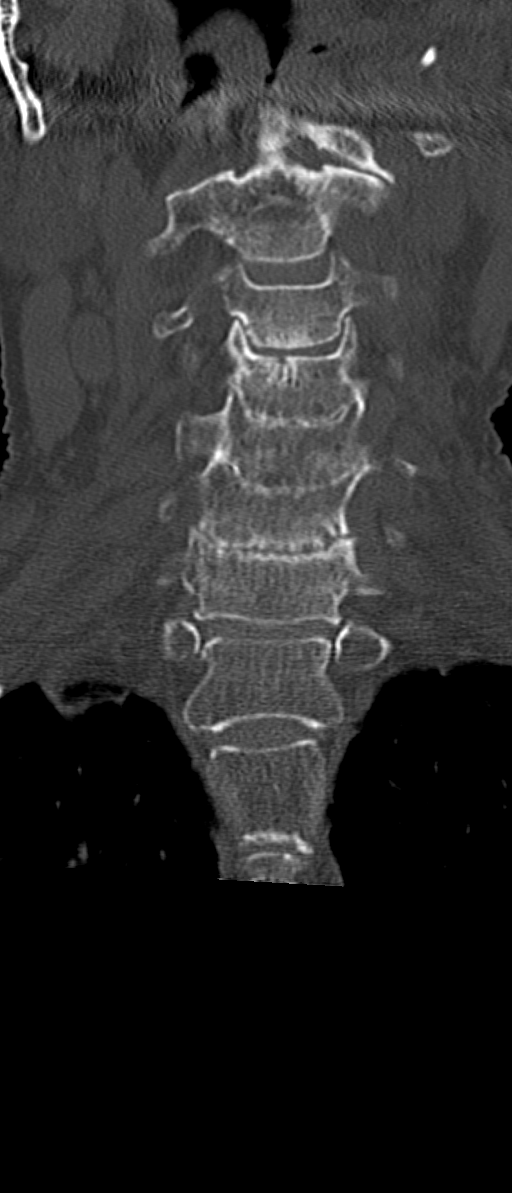
[im 26/52  bone]
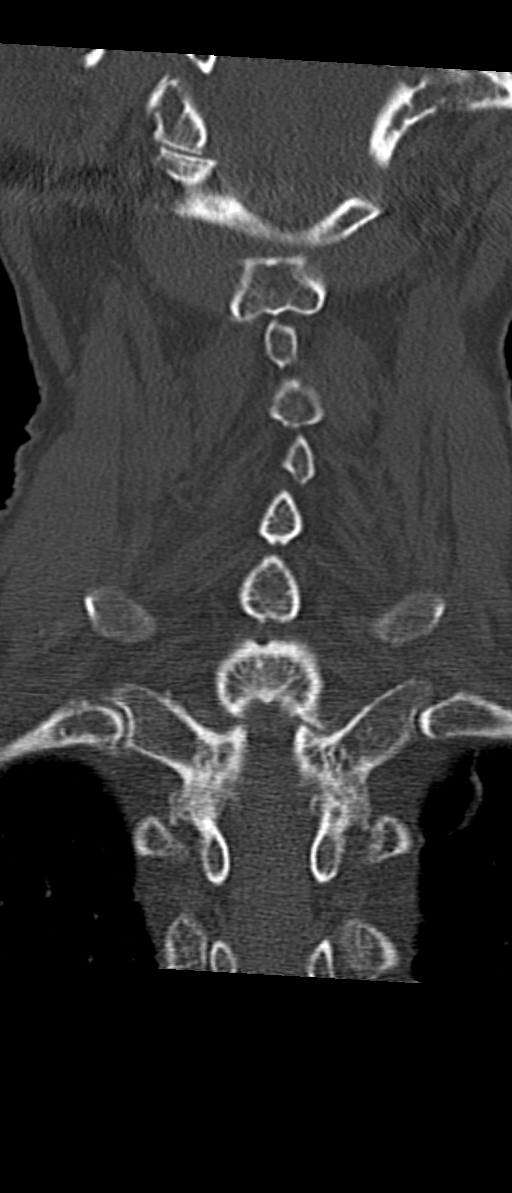
[im 39/52  bone]
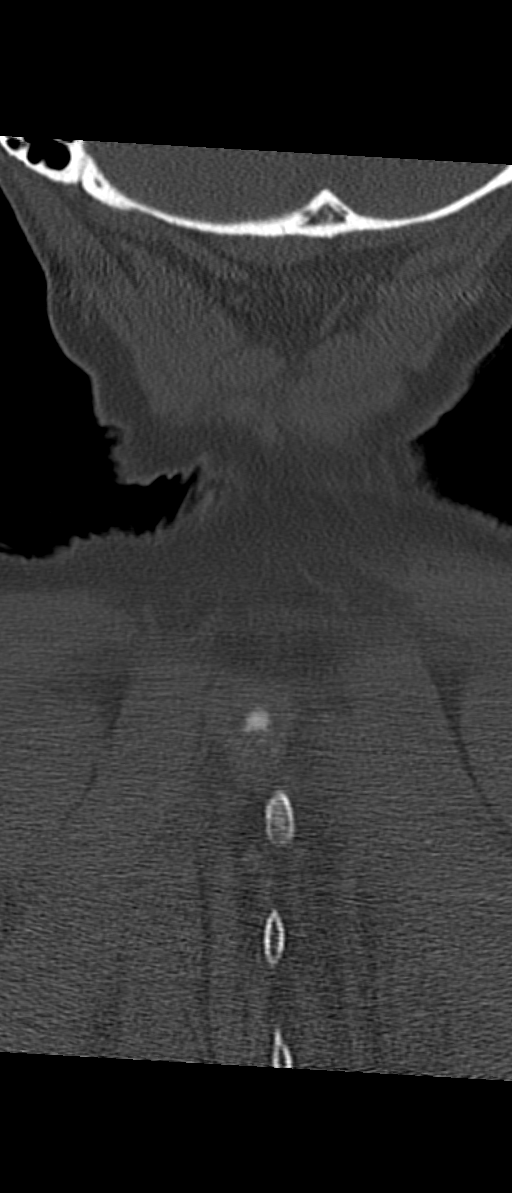

[Series 12: orthogonal bone · axial · 0.19mm/px · z∈[-301,-178]mm · 5 of 112 slices shown, 7 images]
[im 19/112  soft-tissue]
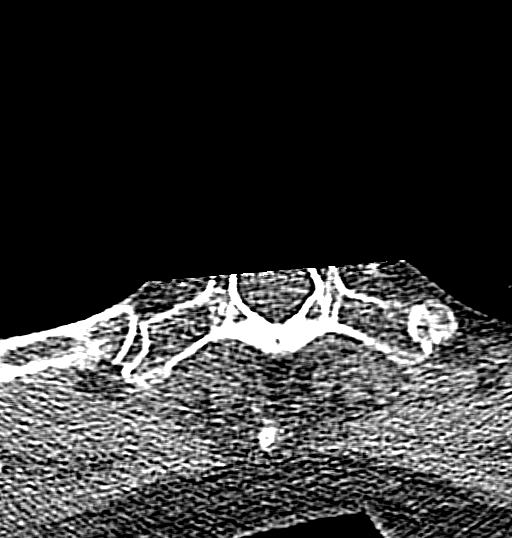
[im 19/112  bone]
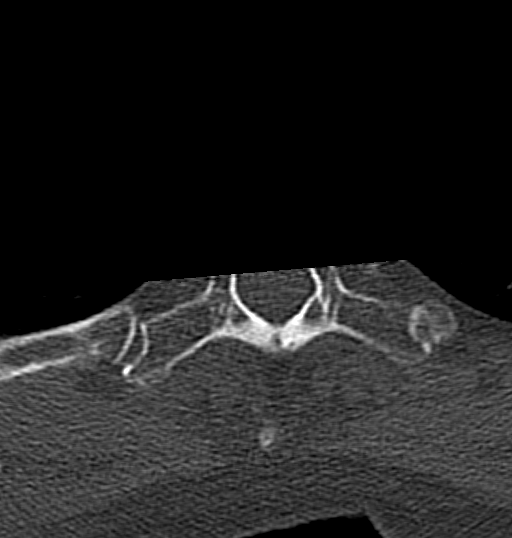
[im 38/112  bone]
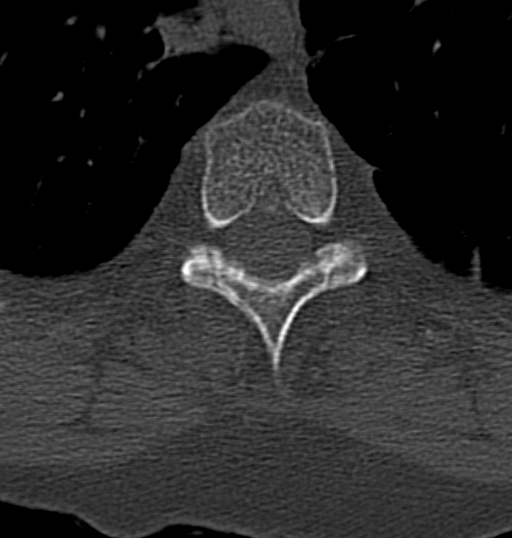
[im 56/112  bone]
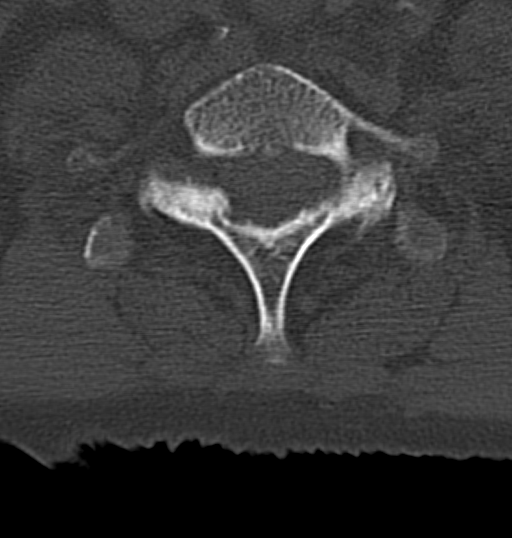
[im 75/112  bone]
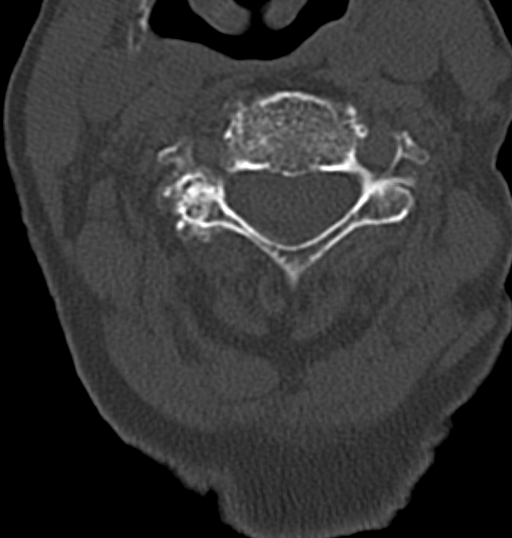
[im 93/112  soft-tissue]
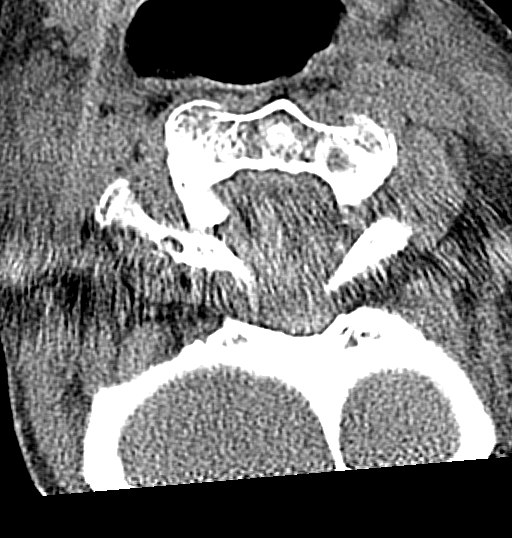
[im 93/112  bone]
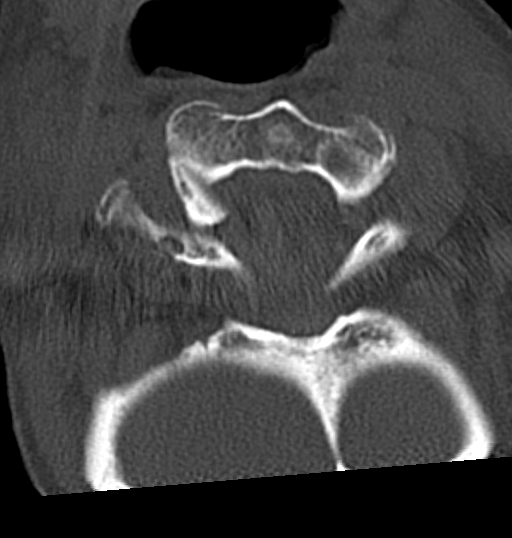

[11 of 33 positions shown; findings below may reference images not displayed]

FINDINGS: CT HEAD FINDINGS

Brain: No acute territorial infarction, hemorrhage or intracranial
mass is visualized. Mild atrophy. Mild small vessel ischemic changes
of the white matter. Prominent ventricles felt related to atrophy

Vascular: No hyperdense vessels.  Carotid artery calcifications.

Skull: No fracture or suspicious lesion

Sinuses/Orbits: No acute finding.

Other: None

CT CERVICAL SPINE FINDINGS

Alignment: 4 mm anterolisthesis of C3 on C4, appears increased
compared with radiographs from 0000. Trace anterolisthesis of C7 on
T1 similar compared to prior. Facet alignment is maintained.

Skull base and vertebrae: Craniovertebral junction is intact. There
is no fracture.

Soft tissues and spinal canal: No prevertebral fluid or swelling. No
visible canal hematoma.

Disc levels: Marked diffuse degenerative changes from C3 through C7
with disc space narrowing, endplate irregularity and osteophytes.
Posterior disc osteophyte complex at C4-C5 and C5-C6.

Upper chest: Mild apical emphysema.  Carotid artery calcification.

Other: None
IMPRESSION: 1. No CT evidence for acute intracranial abnormality. Atrophy and
small vessel ischemic changes of the white matter
2. 4 mm anterolisthesis of C3 and C4, increased compared to prior
radiographs from 0000. If ligamentous injury is a concern, further
evaluation with MRI would be recommended. No fracture is seen.

## 2019-02-01 IMAGING — CR DG HIP (WITH OR WITHOUT PELVIS) 2-3V*L*
3 series · 3 of 3 positions shown · non-contrast
Comparison: None.

CLINICAL DATA: 89-year-old female with fall and left hip pain.

EXAM:
DG HIP (WITH OR WITHOUT PELVIS) 2-3V LEFT

[pelvis ap]
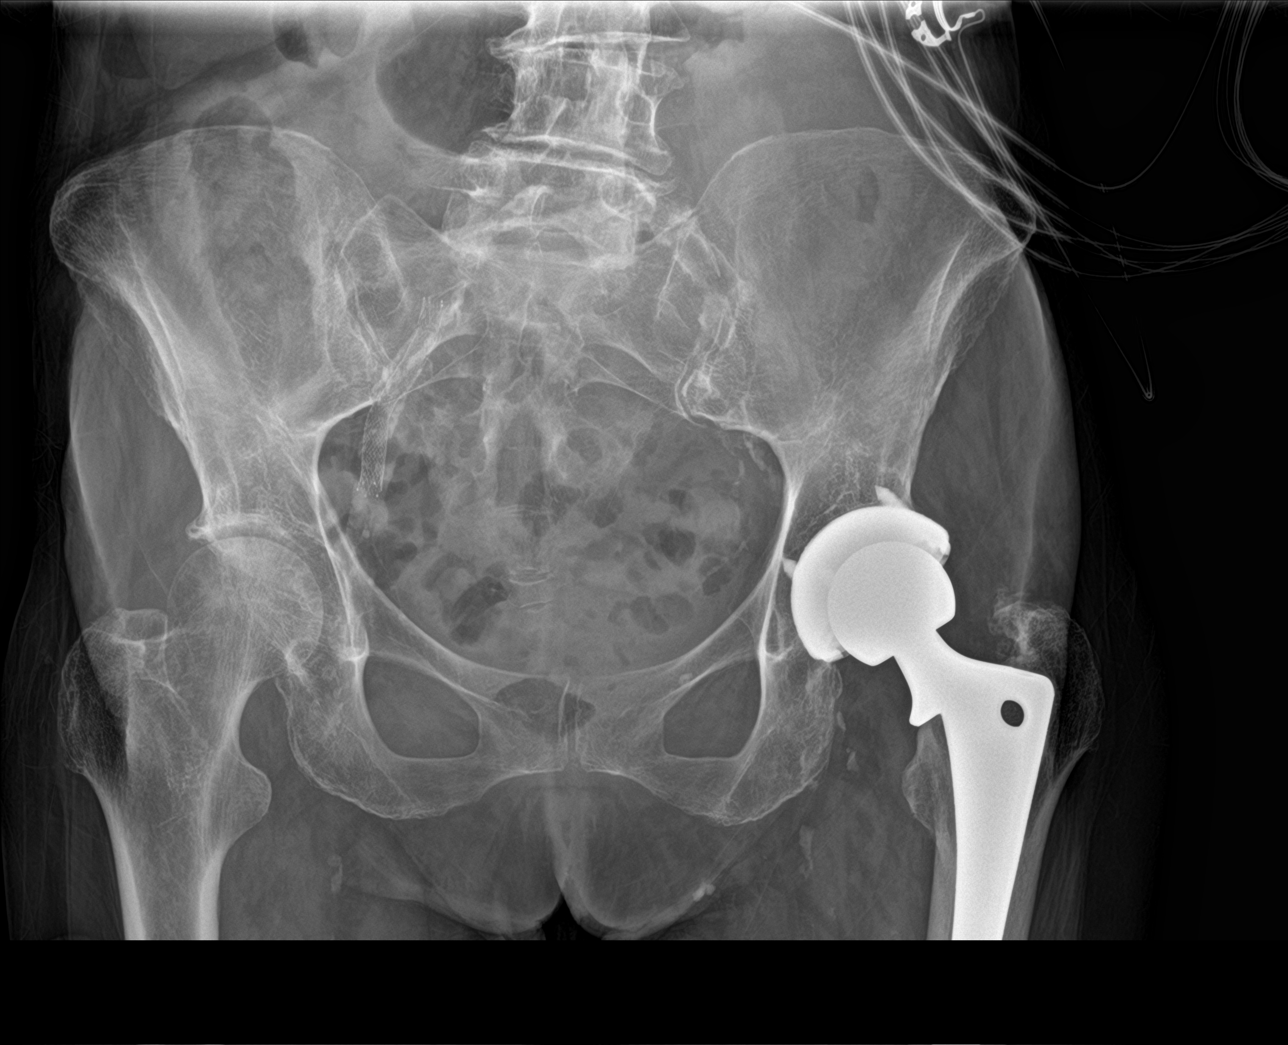

[hip ap]
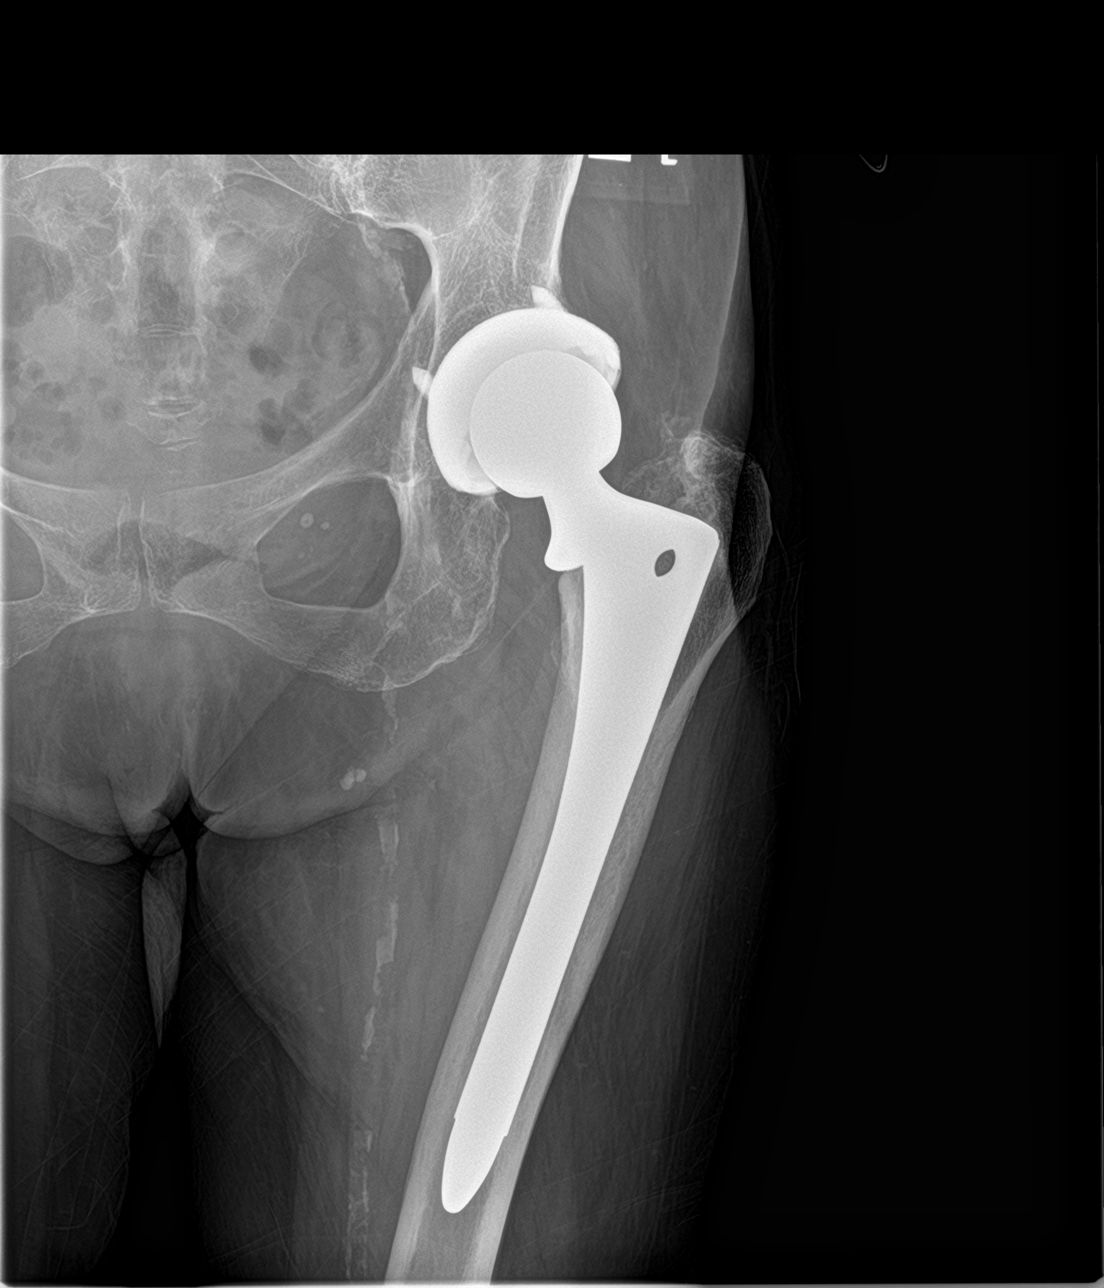

[hip lat]
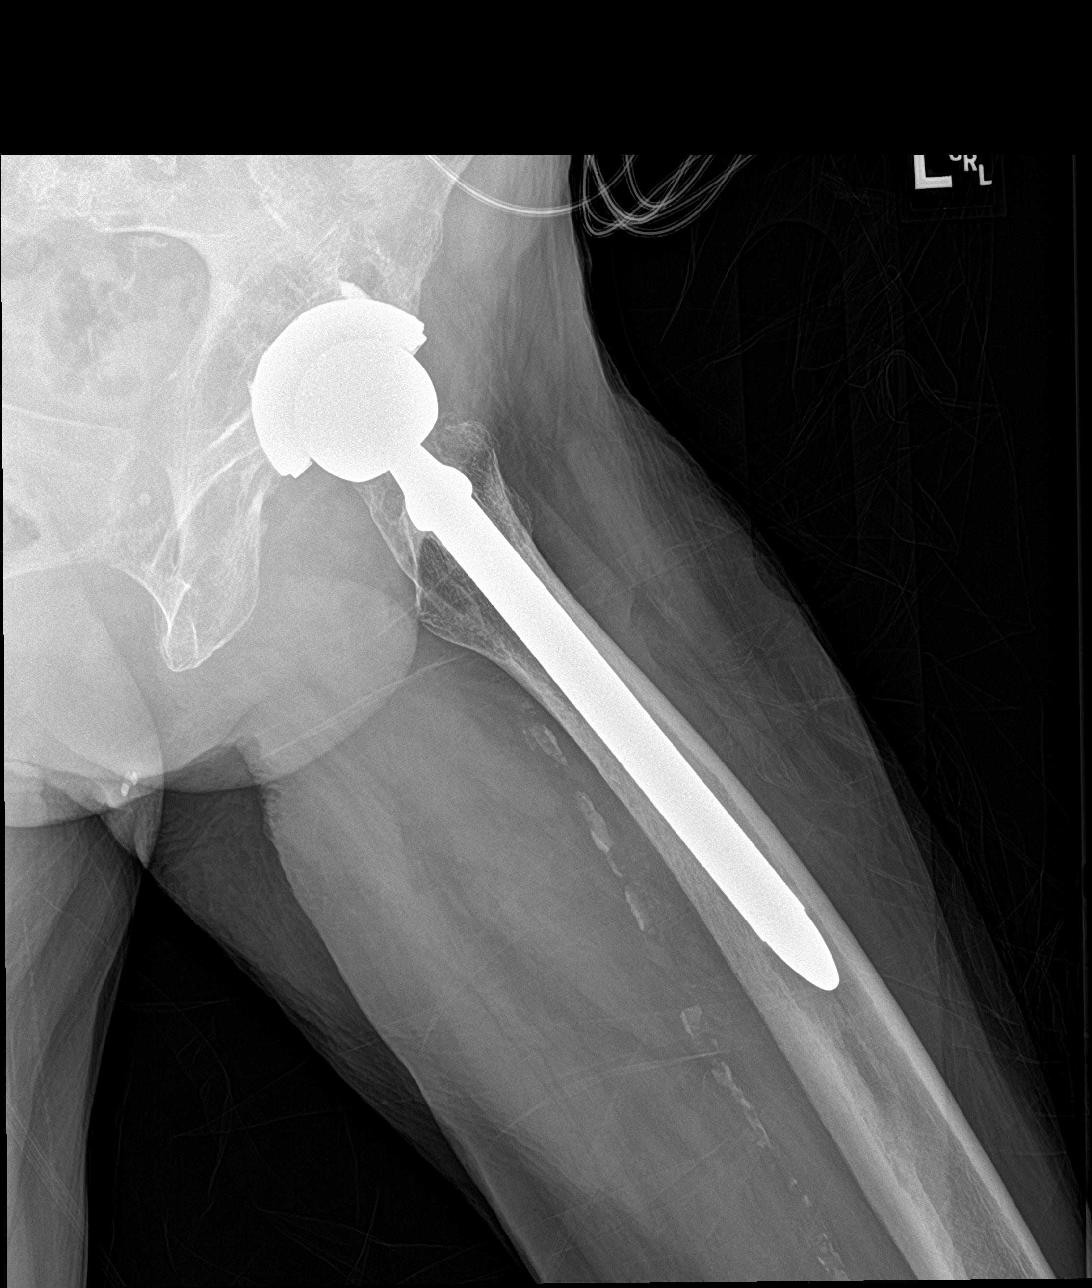

[3 of 3 positions shown; findings below may reference images not displayed]

FINDINGS: There is a total left hip arthroplasty. The orthopedic hardware
appears intact and in anatomic alignment. There is no lucency
surrounding the hardware to suggest loosening. There is no acute
fracture or dislocation. The bones are osteopenic. There are
degenerative changes of the lower lumbar spine.

Vascular calcification and a right iliac vascular stent noted. The
soft tissues appear unremarkable.
IMPRESSION: 1. No acute fracture or dislocation.
2. Left hip arthroplasty appears intact.
